# Patient Record
Sex: Female | Born: 1955
Health system: Southern US, Community
[De-identification: ages and names within clinical notes are randomized; demographics above are authoritative.]

## PROBLEM LIST (undated history)

## (undated) DIAGNOSIS — D649 Anemia, unspecified: Secondary | ICD-10-CM

## (undated) DIAGNOSIS — G43909 Migraine, unspecified, not intractable, without status migrainosus: Secondary | ICD-10-CM

## (undated) DIAGNOSIS — M549 Dorsalgia, unspecified: Secondary | ICD-10-CM

## (undated) DIAGNOSIS — R569 Unspecified convulsions: Secondary | ICD-10-CM

## (undated) DIAGNOSIS — F419 Anxiety disorder, unspecified: Secondary | ICD-10-CM

## (undated) DIAGNOSIS — G8929 Other chronic pain: Secondary | ICD-10-CM

## (undated) DIAGNOSIS — Z8711 Personal history of peptic ulcer disease: Secondary | ICD-10-CM

## (undated) DIAGNOSIS — K911 Postgastric surgery syndromes: Secondary | ICD-10-CM

## (undated) DIAGNOSIS — C801 Malignant (primary) neoplasm, unspecified: Secondary | ICD-10-CM

## (undated) DIAGNOSIS — G20A1 Parkinson's disease without dyskinesia, without mention of fluctuations: Secondary | ICD-10-CM

## (undated) DIAGNOSIS — T39395A Adverse effect of other nonsteroidal anti-inflammatory drugs [NSAID], initial encounter: Secondary | ICD-10-CM

## (undated) DIAGNOSIS — F32A Depression, unspecified: Secondary | ICD-10-CM

## (undated) DIAGNOSIS — R634 Abnormal weight loss: Secondary | ICD-10-CM

## (undated) DIAGNOSIS — F329 Major depressive disorder, single episode, unspecified: Secondary | ICD-10-CM

## (undated) DIAGNOSIS — K311 Adult hypertrophic pyloric stenosis: Secondary | ICD-10-CM

## (undated) DIAGNOSIS — K259 Gastric ulcer, unspecified as acute or chronic, without hemorrhage or perforation: Secondary | ICD-10-CM

## (undated) DIAGNOSIS — K219 Gastro-esophageal reflux disease without esophagitis: Secondary | ICD-10-CM

## (undated) DIAGNOSIS — M810 Age-related osteoporosis without current pathological fracture: Secondary | ICD-10-CM

## (undated) DIAGNOSIS — K589 Irritable bowel syndrome without diarrhea: Secondary | ICD-10-CM

## (undated) DIAGNOSIS — I639 Cerebral infarction, unspecified: Secondary | ICD-10-CM

## (undated) DIAGNOSIS — M069 Rheumatoid arthritis, unspecified: Secondary | ICD-10-CM

## (undated) DIAGNOSIS — T39095A Adverse effect of salicylates, initial encounter: Secondary | ICD-10-CM

## (undated) HISTORY — DX: Dorsalgia, unspecified: M54.9

## (undated) HISTORY — DX: Abnormal weight loss: R63.4

## (undated) HISTORY — DX: Adult hypertrophic pyloric stenosis: K31.1

## (undated) HISTORY — DX: Migraine, unspecified, not intractable, without status migrainosus: G43.909

## (undated) HISTORY — DX: Anemia, unspecified: D64.9

## (undated) HISTORY — DX: Unspecified convulsions: R56.9

## (undated) HISTORY — DX: Parkinson's disease without dyskinesia, without mention of fluctuations: G20.A1

## (undated) HISTORY — DX: Irritable bowel syndrome, unspecified: K58.9

## (undated) HISTORY — PX: SKIN BIOPSY: SHX1

## (undated) HISTORY — DX: Other chronic pain: G89.29

## (undated) HISTORY — DX: Postgastric surgery syndromes: K91.1

## (undated) HISTORY — DX: Age-related osteoporosis without current pathological fracture: M81.0

## (undated) HISTORY — DX: Rheumatoid arthritis, unspecified: M06.9

## (undated) HISTORY — DX: Gastro-esophageal reflux disease without esophagitis: K21.9

## (undated) HISTORY — DX: Major depressive disorder, single episode, unspecified: F32.9

## (undated) HISTORY — DX: Gastric ulcer, unspecified as acute or chronic, without hemorrhage or perforation: K25.9

## (undated) HISTORY — DX: Personal history of peptic ulcer disease: Z87.11

## (undated) HISTORY — DX: Adverse effect of other nonsteroidal anti-inflammatory drugs (NSAID), initial encounter: T39.395A

## (undated) HISTORY — PX: BREAST SURGERY: SHX581

## (undated) HISTORY — DX: Adverse effect of salicylates, initial encounter: T39.095A

## (undated) HISTORY — DX: Anxiety disorder, unspecified: F41.9

## (undated) HISTORY — DX: Depression, unspecified: F32.A

## (undated) HISTORY — PX: TUBAL LIGATION: SHX77

## (undated) HISTORY — DX: Cerebral infarction, unspecified: I63.9

## (undated) HISTORY — DX: Malignant (primary) neoplasm, unspecified: C80.1

---

## 1980-05-22 HISTORY — PX: SEPTOPLASTY: SUR1290

## 1981-05-22 HISTORY — PX: EXCISION MORTON'S NEUROMA: SHX5013

## 1998-05-20 ENCOUNTER — Ambulatory Visit (HOSPITAL_BASED_OUTPATIENT_CLINIC_OR_DEPARTMENT_OTHER): Admission: RE | Admit: 1998-05-20 | Discharge: 1998-05-20 | Payer: Self-pay | Admitting: General Surgery

## 1998-05-22 DIAGNOSIS — R569 Unspecified convulsions: Secondary | ICD-10-CM

## 1998-05-22 HISTORY — PX: TOTAL ABDOMINAL HYSTERECTOMY: SHX209

## 1998-05-22 HISTORY — DX: Unspecified convulsions: R56.9

## 2000-01-16 ENCOUNTER — Emergency Department (HOSPITAL_COMMUNITY): Admission: EM | Admit: 2000-01-16 | Discharge: 2000-01-16 | Payer: Self-pay | Admitting: Emergency Medicine

## 2001-05-10 ENCOUNTER — Encounter: Payer: Self-pay | Admitting: General Surgery

## 2001-05-10 ENCOUNTER — Encounter: Admission: RE | Admit: 2001-05-10 | Discharge: 2001-05-10 | Payer: Self-pay | Admitting: General Surgery

## 2001-11-05 ENCOUNTER — Ambulatory Visit (HOSPITAL_COMMUNITY): Admission: RE | Admit: 2001-11-05 | Discharge: 2001-11-05 | Payer: Self-pay | Admitting: Specialist

## 2001-11-05 ENCOUNTER — Encounter: Payer: Self-pay | Admitting: Specialist

## 2004-02-08 ENCOUNTER — Ambulatory Visit (HOSPITAL_COMMUNITY): Admission: RE | Admit: 2004-02-08 | Discharge: 2004-02-08 | Payer: Self-pay

## 2004-05-19 ENCOUNTER — Emergency Department: Payer: Self-pay | Admitting: Emergency Medicine

## 2005-08-17 ENCOUNTER — Encounter: Admission: RE | Admit: 2005-08-17 | Discharge: 2005-08-17 | Payer: Self-pay | Admitting: Internal Medicine

## 2005-09-14 ENCOUNTER — Encounter: Admission: RE | Admit: 2005-09-14 | Discharge: 2005-09-14 | Payer: Self-pay | Admitting: Internal Medicine

## 2006-06-29 ENCOUNTER — Encounter: Payer: Self-pay | Admitting: Internal Medicine

## 2006-07-04 ENCOUNTER — Ambulatory Visit (HOSPITAL_COMMUNITY): Admission: RE | Admit: 2006-07-04 | Discharge: 2006-07-04 | Payer: Self-pay | Admitting: Gastroenterology

## 2006-07-04 ENCOUNTER — Encounter (INDEPENDENT_AMBULATORY_CARE_PROVIDER_SITE_OTHER): Payer: Self-pay | Admitting: Specialist

## 2006-07-04 ENCOUNTER — Encounter: Payer: Self-pay | Admitting: Internal Medicine

## 2006-07-04 DIAGNOSIS — K311 Adult hypertrophic pyloric stenosis: Secondary | ICD-10-CM

## 2006-07-04 HISTORY — PX: ESOPHAGOGASTRODUODENOSCOPY: SHX1529

## 2006-07-04 HISTORY — DX: Adult hypertrophic pyloric stenosis: K31.1

## 2006-07-11 ENCOUNTER — Encounter: Admission: RE | Admit: 2006-07-11 | Discharge: 2006-07-11 | Payer: Self-pay | Admitting: Gastroenterology

## 2006-07-11 ENCOUNTER — Encounter: Payer: Self-pay | Admitting: Internal Medicine

## 2006-08-06 ENCOUNTER — Ambulatory Visit (HOSPITAL_COMMUNITY): Admission: RE | Admit: 2006-08-06 | Discharge: 2006-08-06 | Payer: Self-pay | Admitting: Gastroenterology

## 2006-08-06 ENCOUNTER — Encounter: Payer: Self-pay | Admitting: Internal Medicine

## 2008-03-31 ENCOUNTER — Encounter: Payer: Self-pay | Admitting: Internal Medicine

## 2008-09-16 ENCOUNTER — Encounter: Admission: RE | Admit: 2008-09-16 | Discharge: 2008-09-16 | Payer: Self-pay | Admitting: Gastroenterology

## 2008-09-16 ENCOUNTER — Encounter: Payer: Self-pay | Admitting: Internal Medicine

## 2008-10-16 ENCOUNTER — Encounter: Payer: Self-pay | Admitting: Internal Medicine

## 2008-11-25 ENCOUNTER — Ambulatory Visit (HOSPITAL_COMMUNITY): Admission: RE | Admit: 2008-11-25 | Discharge: 2008-11-25 | Payer: Self-pay | Admitting: Gastroenterology

## 2008-11-25 ENCOUNTER — Encounter: Payer: Self-pay | Admitting: Internal Medicine

## 2008-11-25 ENCOUNTER — Encounter (INDEPENDENT_AMBULATORY_CARE_PROVIDER_SITE_OTHER): Payer: Self-pay | Admitting: Gastroenterology

## 2008-11-25 DIAGNOSIS — K311 Adult hypertrophic pyloric stenosis: Secondary | ICD-10-CM

## 2008-11-25 HISTORY — DX: Adult hypertrophic pyloric stenosis: K31.1

## 2008-11-25 HISTORY — PX: ESOPHAGOGASTRODUODENOSCOPY: SHX1529

## 2009-04-13 ENCOUNTER — Encounter: Payer: Self-pay | Admitting: Internal Medicine

## 2009-04-13 ENCOUNTER — Ambulatory Visit (HOSPITAL_COMMUNITY): Admission: RE | Admit: 2009-04-13 | Discharge: 2009-04-13 | Payer: Self-pay | Admitting: Gastroenterology

## 2009-04-13 HISTORY — PX: ESOPHAGOGASTRODUODENOSCOPY: SHX1529

## 2009-06-04 ENCOUNTER — Encounter: Payer: Self-pay | Admitting: Internal Medicine

## 2009-06-08 ENCOUNTER — Encounter: Admission: RE | Admit: 2009-06-08 | Discharge: 2009-06-08 | Payer: Self-pay | Admitting: Surgery

## 2009-06-29 HISTORY — PX: OTHER SURGICAL HISTORY: SHX169

## 2009-06-30 ENCOUNTER — Encounter (HOSPITAL_COMMUNITY): Admission: RE | Admit: 2009-06-30 | Discharge: 2009-09-21 | Payer: Self-pay | Admitting: Gastroenterology

## 2009-07-03 ENCOUNTER — Encounter: Payer: Self-pay | Admitting: Internal Medicine

## 2009-07-09 ENCOUNTER — Encounter: Payer: Self-pay | Admitting: Internal Medicine

## 2009-07-09 ENCOUNTER — Inpatient Hospital Stay (HOSPITAL_COMMUNITY): Admission: RE | Admit: 2009-07-09 | Discharge: 2009-07-13 | Payer: Self-pay | Admitting: Surgery

## 2009-07-22 ENCOUNTER — Encounter: Payer: Self-pay | Admitting: Internal Medicine

## 2009-10-27 ENCOUNTER — Encounter: Payer: Self-pay | Admitting: Internal Medicine

## 2010-03-11 ENCOUNTER — Encounter: Admission: RE | Admit: 2010-03-11 | Discharge: 2010-03-11 | Payer: Self-pay

## 2010-03-11 ENCOUNTER — Encounter: Payer: Self-pay | Admitting: Internal Medicine

## 2010-04-27 ENCOUNTER — Encounter: Payer: Self-pay | Admitting: Internal Medicine

## 2010-05-03 ENCOUNTER — Ambulatory Visit: Payer: Self-pay | Admitting: Internal Medicine

## 2010-06-12 ENCOUNTER — Encounter: Payer: Self-pay | Admitting: Gastroenterology

## 2010-06-12 ENCOUNTER — Encounter: Payer: Self-pay | Admitting: Internal Medicine

## 2010-06-17 ENCOUNTER — Telehealth: Payer: Self-pay | Admitting: Internal Medicine

## 2010-06-21 NOTE — Letter (Signed)
Summary: New Patient letter  [facility]  [address] [place], [place] [address]   Phone: [phone]  Fax: [phone]       04/27/2010 MRN: [id number]  Alexandra Lewis [address] [place], [place]  [address]  Dear Ms. Kristin Bruins,  Welcome to the Gastroenterology Division at [facility].    You are scheduled to see Dr.  Leone Payor on 05/03/10 at 4:00 p.m. on the 3rd floor at [facility], [address]  We ask that you try to arrive at our office 15 minutes prior to your appointment time to allow for check-in.  We would like you to complete the enclosed self-administered evaluation form prior to your visit and bring it with you on the day of your appointment.  We will review it with you.  Also, please bring a complete list of all your medications or, if you prefer, bring the medication bottles and we will list them.  Please bring your insurance card so that we may make a copy of it.  If your insurance requires a referral to see a specialist, please bring your referral form from your primary care physician.  Co-payments are due at the time of your visit and may be paid by cash, check or credit card.     Your office visit will consist of a consult with your physician (includes a physical exam), any laboratory testing he/she may order, scheduling of any necessary diagnostic testing (e.g. x-ray, ultrasound, CT-scan), and scheduling of a procedure (e.g. Endoscopy, Colonoscopy) if required.  Please allow enough time on your schedule to allow for any/all of these possibilities.    If you cannot keep your appointment, please call [phone] to cancel or reschedule prior to your appointment date.  This allows [place] the opportunity to schedule an appointment for another patient in need of care.  If you do not cancel or reschedule by 5 p.m. the business day prior to your appointment date, you will be charged a $50.00 late cancellation/no-show fee.    Thank you for  choosing [facility] for your medical needs.  We appreciate the opportunity to care for you.  Please visit [place] at our website  to learn more about our practice.                     Sincerely,                                                             The Gastroenterology Division

## 2010-06-22 ENCOUNTER — Other Ambulatory Visit: Payer: Self-pay | Admitting: Internal Medicine

## 2010-06-22 ENCOUNTER — Ambulatory Visit (INDEPENDENT_AMBULATORY_CARE_PROVIDER_SITE_OTHER): Payer: Medicare Other | Admitting: Internal Medicine

## 2010-06-22 ENCOUNTER — Encounter (INDEPENDENT_AMBULATORY_CARE_PROVIDER_SITE_OTHER): Payer: Self-pay | Admitting: *Deleted

## 2010-06-22 ENCOUNTER — Other Ambulatory Visit: Payer: Commercial Managed Care - PPO

## 2010-06-22 ENCOUNTER — Encounter: Payer: Self-pay | Admitting: Internal Medicine

## 2010-06-22 DIAGNOSIS — K589 Irritable bowel syndrome without diarrhea: Secondary | ICD-10-CM

## 2010-06-22 DIAGNOSIS — R634 Abnormal weight loss: Secondary | ICD-10-CM

## 2010-06-22 DIAGNOSIS — G43909 Migraine, unspecified, not intractable, without status migrainosus: Secondary | ICD-10-CM | POA: Insufficient documentation

## 2010-06-22 DIAGNOSIS — R112 Nausea with vomiting, unspecified: Secondary | ICD-10-CM

## 2010-06-22 DIAGNOSIS — K911 Postgastric surgery syndromes: Secondary | ICD-10-CM | POA: Insufficient documentation

## 2010-06-22 DIAGNOSIS — M810 Age-related osteoporosis without current pathological fracture: Secondary | ICD-10-CM | POA: Insufficient documentation

## 2010-06-22 DIAGNOSIS — Z8711 Personal history of peptic ulcer disease: Secondary | ICD-10-CM | POA: Insufficient documentation

## 2010-06-22 DIAGNOSIS — F39 Unspecified mood [affective] disorder: Secondary | ICD-10-CM | POA: Insufficient documentation

## 2010-06-22 LAB — CBC WITH DIFFERENTIAL/PLATELET
Basophils Absolute: 0 10*3/uL (ref 0.0–0.1)
Eosinophils Absolute: 0 10*3/uL (ref 0.0–0.7)
Hemoglobin: 13.7 g/dL (ref 12.0–15.0)
Lymphocytes Relative: 28.7 % (ref 12.0–46.0)
MCV: 88.5 fl (ref 78.0–100.0)
Monocytes Relative: 9.5 % (ref 3.0–12.0)
Neutro Abs: 3.7 10*3/uL (ref 1.4–7.7)
Neutrophils Relative %: 60.5 % (ref 43.0–77.0)
RDW: 14.1 % (ref 11.5–14.6)

## 2010-06-22 LAB — COMPREHENSIVE METABOLIC PANEL
AST: 16 U/L (ref 0–37)
Alkaline Phosphatase: 94 U/L (ref 39–117)
BUN: 9 mg/dL (ref 6–23)
CO2: 30 mEq/L (ref 19–32)
Chloride: 106 mEq/L (ref 96–112)
Potassium: 4.5 mEq/L (ref 3.5–5.1)
Sodium: 141 mEq/L (ref 135–145)
Total Bilirubin: 0.4 mg/dL (ref 0.3–1.2)

## 2010-06-22 LAB — IGA: IgA: 72 mg/dL (ref 68–378)

## 2010-06-22 LAB — CONVERTED CEMR LAB: Vit D, 25-Hydroxy: 46 ng/mL (ref 30–89)

## 2010-06-23 DIAGNOSIS — R112 Nausea with vomiting, unspecified: Secondary | ICD-10-CM

## 2010-06-23 DIAGNOSIS — R634 Abnormal weight loss: Secondary | ICD-10-CM

## 2010-06-23 NOTE — Op Note (Signed)
Summary: Hiatal hernia/gastric outlet obstruction   NAME:  Alexandra Lewis, Alexandra Lewis          ACCOUNT NO.:  [id number]      MEDICAL RECORD NO.:  [id number]          PATIENT TYPE:  INP      LOCATION:  0001                         FACILITY:  [facility]      PHYSICIAN:  Thornton Park. Daphine Deutscher, MD  DATE OF BIRTH:  [birth year]      DATE OF PROCEDURE:  07/09/2009   DATE OF DISCHARGE:                                  OPERATIVE REPORT      PREOPERATIVE INDICATIONS:  This is a 55 year old white female with   gastric outlet obstruction.      POSTOPERATIVE DIAGNOSIS:  Hiatal hernia and gastric outlet obstruction.      PROCEDURES:  Laparoscopic takedown and repair of sliding hiatal hernia,   truncal vagotomy, Heinicke-Mikulicz pyloroplasty, upper endoscopy.      SURGEON:  Luretha Murphy, MD      ASSISTANT:  Dr. Abigail Miyamoto      ANESTHESIA:  General endotracheal.      DESCRIPTION OF PROCEDURE:  Alexandra Lewis was taken to room 1 on Friday,   July 09, 2009, given general anesthesia.  The abdomen was prepped   with a Technicare equivalent, draped sterilely.  Access to the abdomen   was achieved through the left upper quadrant, using a 0-degree 5-mm   scope, entering the abdomen and insufflating without difficulty.   Following this, a 5-mm was placed with a 5-mm scope on the left lower   portion of the abdomen and on the right side I used a 10/11 in the lower   port and 5 in the upper port.  A 5 upper midline port was used for the   Surveyor, mining.      The liver was retracted and pictures were obtained of a fairly big   hiatal hernia with stomach up in the chest and a fairly prominent   defect.  The this was first managed with taking down the hiatal hernia   sac and bringing the stomach to length.  I then repaired the hiatus with   a single suture posteriorly and anteriorly without pledgets and we   passed a #50 lighted bougie and that was snug going through this   closure, but passed without  resistance.  I also was able to get a   Penrose drain around the foregut, holding at length and then found the   anterior vagus on the left, which was taken off when I was taking down   the phrenoesophageal ligaments and portion of the hernia sac and then   the posterior was found and clipped and then divided, first cauterizing   with the Harmonic scalpel and then was cut.  These were divided, but   were not sent for frozen section because that was not going to impact   our procedure in that locale.      We then went down and connected the 2 lower ports on the right, so that   there was good access to the duodenum, pyloric region.  That did, in   fact, show [place] the area we were  to bring that up and then I made a   longitudinal incision, about 4-5 cm in length.  I broke up the cicatrix   posteriorly and then put holding stitches on either side, which I used   to make the initial opening, and then closed these transversely, first   with the canal with using 4-0 PDS and then with interrupted seromuscular   sutures in the Lembert fashion.  This approximated this nicely.      Next I went in from above with the endoscope and passed the scope in   through the esophagus and then the EG junction and then found my way   down to the area of the pyloroplasty.  We were able to get across the   pyloroplasty into the duodenum and all the time inflating and putting   the anastomosis under water, but no bubbles were seen.  I then withdrew   the scope, decompressed the stomach, placed an NG tube, which I was able   to feel.  Some Tisseel was applied over the suture line and omentum was   lain over that, as well.      The incision, which was a transverse incision, only partially cut the   rectus muscles, allowing the medial rectus to remain.  The posterior   sheath was closed with running #1 PDS from either end and tied and then   the anterior sheath was closed from either end with running PDS and   tied.   The fascia was injected with some Marcaine.  We irrigated again   and then closed with 4-0 Vicryl, Benzoin and Steri-Strips.  The patient   tolerated the procedure well and was taken to the recovery room in   satisfactory condition.               Thornton Park Daphine Deutscher, MD            MBM/MEDQ  D:  07/09/2009  T:  07/09/2009  Job:  [id number]      cc:   Dr. Freddi Starr, II      Petra Kuba, M.D.   Fax: [phone]      Electronically Signed by Luretha Murphy MD on 07/26/2009 02:13:44 PM

## 2010-06-23 NOTE — Procedures (Signed)
Summary: EGD/ Dr Randa Evens   EGD  Procedure date:  07/04/2006  Findings:      Location: [facility]   NAME:  Alexandra Lewis, Alexandra Lewis          ACCOUNT NO.:  [id number]   MEDICAL RECORD NO.:  [id number]          PATIENT TYPE:  AMB   LOCATION:  ENDO                         FACILITY:  [facility]   PHYSICIAN:  [name]., M.D.DATE OF BIRTH:  12-Feb-1956   DATE OF PROCEDURE:  07/04/2006  DATE OF DISCHARGE:                               OPERATIVE REPORT   SURGEON:  Fayrene Fearing L. Randa Evens, M.D.   PROCEDURE:  Esophagogastroduodenoscopy with biopsy.   MEDICATIONS:  Hurricaine spray, fentanyl 75 mcg, Versed 7.5 mg IV.   SCOPE:  Pentax endoscope.   INDICATIONS:  Patient with abdominal pain, nausea and vomiting, weight  loss, diarrhea.  This is done to evaluate for this.  She also has been  found to be anemic and iron deficient.   DESCRIPTION OF PROCEDURE:  The procedure was explained to the patient  and consent obtained.  In the left lateral decubitus position, the scope  was inserted and advanced into the esophagus.  The stomach was reached.  The gastric outlet obstruction was seen. The gastric outlet obstruction  was markedly inflamed.  We went towards what appeared to be the pyloric  channel, and on the anterior wall could be seen an ulcer.  I did not try  to push through this.  There was a lot of inflammation, and the clear  white base of an ulcer in the anterior wall of the pyloric channel.  The  scope was withdrawn back into the stomach.  Several biopsies were  obtained for a rapid urease test for Helicobacter.  The fundus and  cardia were seen well on the retroflex view.  The scope was withdrawn.  The distal esophagus was somewhat reddened, but there were no  ulcerations or other gross abnormalities.  The scope was withdrawn.  The  patient tolerated the procedure well.   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   PLAN:  Will start the patient on  Nexium b.i.d., check the test for  Helicobacter, and see her back in the office in 3 weeks.           ______________________________  Llana Aliment Malon Kindle., M.D.     Waldron Session  D:  07/04/2006  T:  07/04/2006  Job:  [id number]   cc:   Dr Cloyde Reams

## 2010-06-23 NOTE — Procedures (Signed)
Summary: EGD/ Dr Ewing Schlein   EGD  Procedure date:  04/13/2009  Findings:      Location: [facility]   NAME:  Alexandra Lewis, Alexandra Lewis          ACCOUNT NO.:  [id number]      MEDICAL RECORD NO.:  [id number]          PATIENT TYPE:  AMB      LOCATION:  ENDO                         FACILITY:  [facility]      PHYSICIAN:  Petra Kuba, M.D.    DATE OF BIRTH:  November 12, 1955      DATE OF PROCEDURE:  04/13/2009   DATE OF DISCHARGE:                                  OPERATIVE REPORT      PROCEDURE PERFORMED:  Esophagogastroduodenoscopy with pyloric balloon   dilatation.      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      INDICATIONS FOR PROCEDURE:  The patient with some help from previous   balloon dilatation, requesting another.  Consent was signed after risks,   benefits, methods, options thoroughly discussed multiple times in the   past with both the patient and her husband.      MEDICINES USED:  Fentanyl 120 mcg, Versed 12 mg.      PROCEDURE IN DETAIL:  First therapeutic video endoscope was inserted by   direct vision into her esophagus.  A small to medium sized hiatal hernia   was seen and advanced into the stomach.  A little bit of fluid was   suctioned but there was not much remaining.  Scope was advanced to the   antrum where a peripyloric shallow ulceration and stricturing was   confirmed.  We were unable to advance the scope.  The scope was   retroflexed pertinent for a normal angularis, cardia, fundus, lesser and   greater curve except for the hiatal hernia was confirmed in the cardia.   Straight visualization in the stomach did not reveal any additional   findings.  We went ahead and advanced to the pylorus and using a 5.5   [place] straight catheter, dye was injected into the duodenum and then we   went ahead and proceeded with our balloon  wire guided dilations under   fluoro using a 5.5 cm balloon.  Initially we had the adjustable 8 to 10   mm balloon which was inflated first to 8 mm  and held for 30 seconds and   then 10 mm and hold for a minute.  Then we switched to the 10-12   adjustable balloon and held it for 11 mm for 30 seconds than 12 mm for a   minute.  Unfortunately we still could not advance the scope at this   juncture.  There seemed to be still some internal stricturing further   distally and we went ahead and repeated the dilation as above, pushing   the balloon further in to the duodenum and also putting contrast in the   balloon.  Unfortunately with each of the balloon dilatations just a   touch of waste was seen in the middle of the balloon.  We did reinflate   to 0 mm for a minute and then  12 mm for a minute, but still could not   advance the therapeutic scope.  At this juncture we switched to the   regular scope but could not advance that through the pylorus either.  We   then went ahead and reinserted the therapeutic scope and passed the   adjustable 12-15 mm adjustable balloon catheter and inflated it to 13.5   mm for a minute using the 5.5 cm adjustable balloons as previously done.   All positioning was confirmed under fluoroscopy.  After a minute the   balloon was deflated and removed.  We did remove the scope since we were   unable to advance it but were able to advance the pediatric endoscope   which showed a normal distal bulb and second portion of the duodenum and   normal ampulla as well.  The scope was removed.  Again a good look at   the esophagus on multiple advances and withdrawals were normal and we   went ahead and tried the regular scope again, but we were unsuccessful   in advancing it.  We elected to stop the procedure at this juncture.   The scope was removed.  The patient tolerated the procedure well.  There   was no obvious immediate complication.      ENDOSCOPIC DIAGNOSES:   1. Small to medium size hiatal hernia.   2. Peripyloric ulcer, shallow with pyloric stricturing and stenosis        status post injecting Omnipaque using the 5.5 French straight       catheter.   3. Balloon dilatation under fluoro using the 5.5 cm adjustable       balloons,  8-10, 10-12 and then the 12-15 inflating it to 13.5 mm.   4. Able to advance the pediatric scope at the end of the procedure but       not the regular scope with normal distal bulb and second portion of       the duodenum.      PLAN:  See how she does with this dilatation.  I still expect her to   need surgical options at some point in the future but happy to continue   aggressiveness with the dilation if need be  and will continue pump   inhibitors b.i.d. and the customary warnings will be reviewed.                  ______________________________   Petra Kuba, M.D.            MEM/MEDQ  D:  04/13/2009  T:  04/13/2009  Job:  [id number]      cc:   Thornton Park Daphine Deutscher, MD   [address], [address]   [place]   [place] [address]

## 2010-06-23 NOTE — Procedures (Signed)
Summary: EGD / Dr Magod/path   EGD  Procedure date:  11/25/2008  Findings:      Location: [facility]   NAME:  Alexandra Lewis, Alexandra Lewis          ACCOUNT NO.:  [id number]      MEDICAL RECORD NO.:  [id number]          PATIENT TYPE:  AMB      LOCATION:  ENDO                         FACILITY:  [facility]      PHYSICIAN:  Petra Kuba, M.D.    DATE OF BIRTH:  [birth year]      DATE OF PROCEDURE:  11/25/2008   DATE OF DISCHARGE:                                  OPERATIVE REPORT      PROCEDURE:  Esophagogastroduodenoscopy balloon dilatation and biopsy.      INDICATION:  Nausea, vomiting, abdominal abnormal CT scan, and history   of pyloric ulcer.  Consent was signed after risks, benefits, methods,   and options thoroughly discussed multiple times in the past.      MEDICINE USED:   1. Fentanyl 100 mcg.   2. Versed 12.5 mg.      PROCEDURE IN DETAIL:  The regular video endoscope was inserted by direct   vision.  The esophagus was normal.  The scope passed into the stomach   and obvious old food was seen.  Scope advanced to the pylorus, which had   a peripyloric ulcer shallow, but we could not advance the scope through   a tiny opening.  The scope was retroflexed and the parts of the proximal   stomach seen not obscured by food were normal.  Visualization of the   stomach did not reveal any additional findings.  Fluid was suctioned.   We cannot suction all of the food in the stomach, but some could since   we wanted to then balloon dilate her pylorus.  We had a switch to the   bigger scope with the bigger channel.  We elected to slowly withdraw and   we did have to move her to the fluoro table, which was done after the   scope was removed.  Her esophagus was normal.  Once she was repositioned   on the fluoro table, the larger channel, larger scope was inserted again   confirming a normal esophagus and advanced to the pyloric area.  Using   the straight ERCP catheter, dye was injected to  confirm the bulb and the   C-loop of the duodenum.  We then removed the catheter and advanced a   wire-guided balloon into the duodenum and initially used the adjustable   6-8 mm balloon inflating it to 6 mm for 30 seconds, 7 mm for 30 seconds,   and 8 mm for a minute.  There was a little bit of heme, but no obvious   problem.  We went ahead and removed this balloon and inserted the   adjustable 10-12 and again inflated first to 10 and then to 11 holding   each for a minute.  There was a little more heme, but no obvious   problem.  Both were done under fluoro, and we removed the balloon after   the 11 mm mark and  tried to advance the scope but were unsuccessful.   The dye had all dissipated in that junction, so we reinjected the dye   with the ERCP catheter.  There was no leak seen.  We reinserted the 12   adjustable balloon into the proper position under fluoro and   endoscopically and inflated into 12 mm and held it for a minute and then   were able to advance the scope into the duodenal bulb, which was normal   and around the C-loop to normal second portion of the duodenum.  We were   able to withdrawn the balloon and advance the scope at the same time.   The ampulla was seen and was normal.  The scope was slowly withdrawn.   There was minimal heme, but no active bleeding and no obvious   perforation.  Scope was withdrawn back to the stomach, little more of   the fluid and food was suctioned.  Scope slowly withdrawn.  Right before   withdrawing the scope, 2 biopsies of the antrum and 2 of the proximal   stomach were obtained to rule out Helicobacter.  Scope was removed.  The   patient tolerated the procedure well.  There was no obvious immediate   complication.      ENDOSCOPIC DIAGNOSES:   1. Old food in the stomach, some of them were suctioned.   2. Pyloric shallow ulcer and stenosis, unable to advance the regular       scope, status post stomach biopsy to rule out Helicobacter.    3. Injected dye using the ERCP catheter and then proceeded with wire-       guided balloon under endoscopic and fluoroscopic guidance using the       6- to 8-mm balloons and the 10- to 12-mm balloon as above.   4. Able to pass the scope after 12-mm balloon dilation with normal       distal bulb second portion of the duodenum and normal ampulla       without obvious complication.      PLAN:  See how the balloon dilation works.  Questionably more aggressive   dilation in the future versus surgical options.  Avoid aspirin,   nonsteroidals.  Continue pump inhibitors.  We will ask her to call and   give me an update in 1-2 weeks if doing well.                  ______________________________   Petra Kuba, M.D.            MEM/MEDQ  D:  11/25/2008  T:  11/26/2008  Job:  [id number]      cc:   Dr. Judie Grieve   Appended Document: EGD / Dr Ewing Schlein SP-Surgical Pathology - STATUS: Final  .                                         Perform Date: 7 Jul10 00:01  Ordered By: Cristela Felt,            Ordered Date: 7 Jul10 14:52  Facility: [facility]                              Department: [facility]  Service Report Text  [address]   [place], [place] [address]   [phone]  [phone]    REPORT OF SURGICAL PATHOLOGY    Case #: [id number]   Patient Name: Alexandra Lewis, Alexandra Lewis   Office Chart Number: N/A    MRN: [id number]   Pathologist: H. Hollice Espy, MD   DOB/Age January 04, 1956 (Age: 55) Gender: F   Date Taken: 11/25/2008   Date Received: 11/25/2008    FINAL DIAGNOSIS    ***MICROSCOPIC EXAMINATION AND DIAGNOSIS***    STOMACH, BIOPSY: MILD CHRONIC GASTRITIS, NO EVIDENCE OF   HELICOBACTER PYLORI, INTESTINAL METAPLASIA, DYSPLASIA OR   MALIGNANCY IDENTIFIED.    COMMENT   A Warthin-Starry stain is performed to determine the possibility   of the presence of Helicobacter pylori. The Warthin-Starry stain   is negative for organisms of Helicobacter pylori. The control(s)   stained appropriately.    gdt   Date  Reported: 11/26/2008 H. Hollice Espy, MD   *** Electronically Signed Out By [facility] ***    Clinical information   R/O Helicobacter (ms)    specimen(s) obtained   Stomach, biopsy    Gross Description   Received in formalin are tan, soft tissue fragments that are   submitted in toto. Number: Four   Size: 0.2 to 0.7 cm (GP:mj 11/25/08)    mj/    STOMBX-Stomach, biopsy   Received in formalin are tan, soft tissue fragments that are   submitted in toto. Number: Four   Size: 0.2 to 0.7 cm (GP:mj 11/25/08)   STOMACH, BIOPSY: MILD CHRONIC GASTRITIS, NO EVIDENCE OF   HELICOBACTER PYLORI, INTESTINAL METAPLASIA, DYSPLASIA OR   MALIGNANCY IDENTIFIED.  Additional Information  HL7 RESULT STATUS : F  External IF Update Timestamp : 2008-11-25:14:51:00.000000

## 2010-06-23 NOTE — Progress Notes (Signed)
Summary: appt wed PM?  Phone Note Call from Patient   Caller: Spouse Summary of Call: She is continuing to have nausea and vomiting and weight loss. (Dumping syndrome) Dr. Kristin Bruins asked if I could see her next week and we are hoping for Wed PM. Thinking 1 or 130 (I am off tha PM but will do this) Please find out if I can do this, I would not need alot of help she could not come before due to vomiting and they were then busy moving. Have we gotten records?? I do not think I have seen them - he may have and I will double check when i call him back Iva Boop MD, [facility]  June 17, 2010 12:51 PM   Follow-up for Phone Call        1:30 next wed, the above phone number doesn't work.  Do you have an alternate number and I can notify them of the appointment , or do you want to tell them. Follow-up by: Darcey Nora RN, CGRN,  June 17, 2010 3:55 PM  Additional Follow-up for Phone Call Additional follow up Details #1::        I have Dr. Luretha Murphy # and will tell him as well as ask abou records. Some must be in [place] so copy/paste please Additional Follow-up by: Iva Boop MD, Clementeen Graham,  June 17, 2010 4:29 PM    Additional Follow-up for Phone Call Additional follow up Details #2::    records copied from e-chart. Darcey Nora RN, [facility]  June 17, 2010 4:44 PM

## 2010-06-23 NOTE — Procedures (Signed)
Summary: Colon/ Dr Edwards/path   Colonoscopy  Procedure date:  07/04/2006  Findings:      Location:  [facility].    NAMEKAYELEE, Alexandra Lewis          ACCOUNT NO.:  [id number]   MEDICAL RECORD NO.:  [id number]          PATIENT TYPE:  AMB   LOCATION:  ENDO                         FACILITY:  [facility]   PHYSICIAN:  [name]., M.D.DATE OF BIRTH:  02-17-1956   DATE OF PROCEDURE:  07/04/2006  DATE OF DISCHARGE:                               OPERATIVE REPORT   PROCEDURE:  Colonoscopy and biopsy.   MEDICATIONS:  The patient received fentanyl 160 mcg, Versed 60 mg and  Phenergan 12.5 mg IV for both procedures.   SCOPE:  Pentax pediatric scope.   INDICATION:  Diarrhea, anemia, iron deficiency, weight loss.   DESCRIPTION OF PROCEDURE:  Following the endoscopy, the patient was  turned around and received additional sedation.  Digital exam was  performed.  The Pentax pediatric scope was inserted and advanced.  We  were able to advance eventually to the cecum using abdominal pressure.  The ileocecal valve and appendiceal orifice were seen, terminal ileum  entered for approximately 10 cm and was normal.  Scope withdrawn.  Cecum, ascending, transverse, descending and sigmoid colon seen well.  No polyps, diverticula or other gross lesions seen.  The scope was  withdrawn in the rectum  in forward and retroflex view, no polyps were  seen.  Multiple random biopsies were taken throughout the colon.   ASSESSMENT:  1. Diarrhea.  No obvious colitis.  Random biopsies obtained.  2. Iron-deficiency anemia, probably due to the ulcer.  No gross      lesions found in the colon.   PLAN:  Will increase the patient's Nexium.  Will see back in the office  in 3 weeks.           ______________________________  Llana Aliment Malon Kindle., M.D.     Waldron Session  D:  07/04/2006  T:  07/04/2006  Job:  [id number]   cc:   Earleen Reaper, M.D.  Appended Document: Colon/ Dr Randa Evens [facility] Surgical Pathology  - STATUS: Final             By: Windy Kalata,      Perform Date: [id number] 00:01  Ordered By: Lucrezia Starch Date: 13Feb08 14:49  Facility: [facility]                              Department: CPATH  Service Report Text  [facility]   [address]   [place], [place] [address]   [phone]    REPORT OF SURGICAL PATHOLOGY    Case #: [id number]   Patient Name: Alexandra Lewis   Office Chart Number: N/A    MRN: [id number]   Pathologist: Alden Server A. Delila Spence, MD   DOB/Age Sep 16, 1955 (Age: 55) Gender: F   Date Taken: 07/04/2006   Date Received: 07/04/2006    FINAL DIAGNOSIS    ***MICROSCOPIC EXAMINATION AND  DIAGNOSIS***    COLON, BIOPSY: BENIGN COLONIC MUCOSA. NO SIGNIFICANT   INFLAMMATION OR OTHER ABNORMALITIES IDENTIFIED.    COMMENT   There is colorectal mucosa with normal crypt architecture and no   objective increase in inflammation. No active inflammation,   microscopic colitis, collagenous colitis or significant chronic   changes identified. No hyperplastic or adenomatous changes are   seen, and there is no evidence of malignancy. (EA:mj 07/05/06)    mj   Date Reported: 07/05/2006 Alden Server A. Delila Spence, MD   *** Electronically Signed Out By EAA ***    Clinical information   Anemia; diarrhea and abdominal pain (ms)    specimen(s) obtained   Colon, biopsy, random    Gross Description   Received in formalin are tan, soft tissue fragments that are   submitted in toto. Number: Multiple.   Size: 0.2 cm up to 0.4 cm. (SP:cdc:07/04/05)    cc/

## 2010-06-28 ENCOUNTER — Other Ambulatory Visit: Payer: Commercial Managed Care - PPO | Admitting: Internal Medicine

## 2010-06-29 NOTE — Letter (Signed)
Summary: EGD Instructions  [facility]  [address] [place], [place] [address]   Phone: [phone]  Fax: [phone]       Alexandra Lewis    1956/03/25    MRN: [id number]       Procedure Day /Date:TUESDAY 06/28/2010     Arrival Time: 2PM     Procedure Time:3PM     Location of Procedure:                    X  [facility] (4th Floor)   PREPARATION FOR ENDOSCOPY   On 06/28/2010  THE DAY OF THE PROCEDURE:  1.   No solid foods, milk or milk products are allowed after midnight the night before your procedure.  2.   Do not drink anything colored red or purple.  Avoid juices with pulp.  No orange juice.  3.  You may drink clear liquids until1PM , which is 2 hours before your procedure.                                                                                                CLEAR LIQUIDS INCLUDE: Water Jello Ice Popsicles Tea (sugar ok, no milk/cream) Powdered fruit flavored drinks Coffee (sugar ok, no milk/cream) Gatorade Juice: apple, white grape, white cranberry  Lemonade Clear bullion, consomm, broth Carbonated beverages (any kind) Strained chicken noodle soup Hard Candy   MEDICATION INSTRUCTIONS  Unless otherwise instructed, you should take regular prescription medications with a small sip of water as early as possible the morning of your procedure.            OTHER INSTRUCTIONS  You will need a responsible adult at least 54 years of age to accompany you and drive you home.   This person must remain in the waiting room during your procedure.  Wear loose fitting clothing that is easily removed.  Leave jewelry and other valuables at home.  However, you may wish to bring a book to read or an iPod/MP3 player to listen to music as you wait for your procedure to start.  Remove all body piercing jewelry and leave at home.  Total time from sign-in until discharge is approximately 2-3 hours.  You should go home directly after  your procedure and rest.  You can resume normal activities the day after your procedure.  The day of your procedure you should not:   Drive   Make legal decisions   Operate machinery   Drink alcohol   Return to work  You will receive specific instructions about eating, activities and medications before you leave.    The above instructions have been reviewed and explained to me by   _______________________    I fully understand and can verbalize these instructions _____________________________ Date _________

## 2010-06-29 NOTE — Assessment & Plan Note (Signed)
Summary: nausea and vomiting/ rule out dumping syndrome/sheri   Vital Signs:  Patient profile:   55 year old female Height:      64 inches Weight:      87 pounds BMI:     14.99 BSA:     1.37 Pulse rate:   78 / minute Pulse rhythm:   regular BP sitting:   86 / 54  (right arm)  Vitals Entered By: Merri Ray CMA Duncan Dull) (June 22, 2010 1:33 PM)  History of Present Illness Visit Type: Initial Visit Primary GI MD: Stan Head MD [facility] Primary Provider: Kennith Gain Requesting Provider: n/a Chief Complaint: Weight loss with nausea and vomiting History of Present Illness:   55 yo maried white woman s/p laparoscopic truncal vagotomy and open pyloroplasty Jul 09 2009 by Dr. Wenda Low. 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 The ulcer was likely caused by persistent Fiorinal use.  Since that time she has struggled with maintaining her weight and has had intermitent problems with post-prandial nausea, vomiting and diarrhea. Sugar-containing foods will make this worse. She has lost 20 # in 1 year and was about 100# at the time of the surgery. Last few days are actually better.Grlled cheese, soup, crackers, hamburger recently ok. She gave up on Ensure as she only likes chocolate and she thinks chocolate is poorly tolerated. + early satiety + appetite but only recently coming back slept alot, now more motivated and energetic - admits she was depressed. she has vivid memories of her last balloon dilation and comments and ctions of the physician and medical staff involved. She repeatedly indicates today that she never wanted the operation. metaclopramide made her dizzy and fatigued  Uses Fiorinal at least once daily for years for migraines and bone pain.  Since surgery has never had normal bowel movements and has mixed hard stool with some constipation and some loose stools. Uses a stool softener intermittently.      GI Review of  Systems    Reports abdominal pain, acid reflux, heartburn, loss of appetite, nausea, vomiting, and  weight loss.   Weight loss of 20 pounds over 12months.   Denies belching, bloating, chest pain, dysphagia with liquids, dysphagia with solids, vomiting blood, and  weight gain.        Denies anal fissure, black tarry stools, change in bowel habit, constipation, diarrhea, diverticulosis, fecal incontinence, heme positive stool, hemorrhoids, irritable bowel syndrome, jaundice, light color stool, liver problems, rectal bleeding, and  rectal pain.   Preventive Screening-Counseling & Management  Caffeine-Diet-Exercise     Does Patient Exercise: yes      Drug Use:  no.    Colonoscopy  Procedure date:  07/04/2006  Findings:      1. Diarrhea.  No obvious colitis.  Random biopsies obtained.  2. Iron-deficiency anemia, probably due to the ulcer.  No gross      lesions found in the colon.  NO ABNORMAILITIES SEEN INCLUDING TERMINAL ILEUM  Dr. Carman Ching     ***MICROSCOPIC EXAMINATION AND DIAGNOSIS***    COLON, BIOPSY: BENIGN COLONIC MUCOSA. NO SIGNIFICANT   INFLAMMATION OR OTHER ABNORMALITIES IDENTIFIED.    COMMENT   There is colorectal mucosa with normal crypt architecture and no   objective increase in inflammation. No active inflammation,   microscopic colitis, collagenous colitis or significant chronic   changes identified. No hyperplastic or adenomatous changes are   seen, and there is no evidence of malignancy. (EA:mj 07/05/06)  Comments:  Repeat colonoscopy in 10 years.    EGD  Procedure date:  07/04/2006  Findings:        ASSESSMENT:  Duodenal ulcer with partial gastric outlet obstruction.   PLAN:  Will start the patient on Nexium b.i.d., check the test for  Helicobacter, and see her back in the office in 3 weeks.  Dr. Carman Ching  EGD  Procedure date:  11/25/2008  Findings:       ENDOSCOPIC DIAGNOSES:   1. Old food in the stomach, some of them  were suctioned.   2. Pyloric shallow ulcer and stenosis, unable to advance the regular       scope, status post stomach biopsy to rule out Helicobacter.   3. Injected dye using the ERCP catheter and then proceeded with wire-       guided balloon under endoscopic and fluoroscopic guidance using the       6- to 8-mm balloons and the 10- to 12-mm balloon as above.   4. Able to pass the scope after 12-mm balloon dilation with normal       distal bulb second portion of the duodenum and normal ampulla       without obvious complication.      PLAN:  See how the balloon dilation works.  Questionably more aggressive   dilation in the future versus surgical options.  Avoid aspirin,   nonsteroidals.  Continue pump inhibitors.  We will ask her to call and   give me an update in 1-2 weeks if doing well.                  ______________________________   Petra Kuba, M.D.   EGD  Procedure date:  04/13/2009  Findings:        ENDOSCOPIC DIAGNOSES:   1. Small to medium size hiatal hernia.   2. Peripyloric ulcer, shallow with pyloric stricturing and stenosis       status post injecting Omnipaque using the 5.5 French straight       catheter.   3. Balloon dilatation under fluoro using the 5.5 cm adjustable       balloons,  8-10, 10-12 and then the 12-15 inflating it to 13.5 mm.   4. Able to advance the pediatric scope at the end of the procedure but       not the regular scope with normal distal bulb and second portion of       the duodenum.      PLAN:  See how she does with this dilatation.  I still expect her to   need surgical options at some point in the future but happy to continue   aggressiveness with the dilation if need be  and will continue pump   inhibitors b.i.d. and the customary warnings will be reviewed.                  ______________________________   Petra Kuba, M.D.   Procedures Next Due Date:    Colonoscopy: 06/2016  Current Medications (verified): 1)  Estrace 1  Mg Tabs (Estradiol) .Marland Kitchen.. 1 By Mouth Once Daily 2)  Depakote 125 Mg Tbec (Divalproex Sodium) .Marland Kitchen.. 1 By Mouth Once Daily 3)  Nexium 40 Mg Cpdr (Esomeprazole Magnesium) .Marland Kitchen.. 1 By Mouth Once Daily 4)  Compazine 5mg  .... 2 Tablets As Needed 5)  Zomig 5 Mg Tabs (Zolmitriptan) .... 1/2 - 1 As Needed For Migraines 6)  Fiorinal/codeine #3 50-325-40-30 Mg Caps (Butalbital-Asa-Caff-Codeine) .... As Needed For Pain  7)  Vitamin D3 50000 Unit Caps (Cholecalciferol) .... 2 Times A Week  Allergies (verified): 1)  ! 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 Fatigue and hypersomnolence - improving anhedonia - improving All other ROS negative except as per HPI.   Physical Exam  General:  thin, white woman, NAD Eyes:  PERRLA, no icterus. Neck:  Supple; no masses or thyromegaly. Lungs:  Clear throughout to auscultation. Heart:  Regular rate and rhythm; no murmurs, rubs,  or bruits. Abdomen:  thin soft and nontender without HSM or mass BS+ no splash Extremities:  No clubbing, cyanosis, edema or deformities noted. Neurologic:  Alert and  oriented x4;  grossly normal neurologically. Cervical Nodes:  No significant cervical or supraclavicular adenopathy. she does have a mildly prominent riht submandibular node  Psych:  Alert and cooperative. Normal mood and affect.   Impression & Recommendations:  Problem # 1:  WEIGHT LOSS-ABNORMAL  (ICD-783.21) Assessment New I think this is multifactorial. A combination of problems from pyloroplasty and vagotomy (? dumping), and possibly more so depression. She needs a reassessment of the upper GI tract with EGD as she continues to use Fiorinal on a daily basis. Orders: T-Vitamin D (25-Hydroxy) [id number]) T-Sprue Panel (Celiac Disease Aby Eval) (83516x3/[address]) TLB-CBC Platelet - w/Differential (85025-CBCD) TLB-CMP (Comprehensive Metabolic Pnl) (80053-COMP) TLB-TSH (Thyroid Stimulating Hormone) (84443-TSH) TLB-IgA (Immunoglobulin A) (82784-IGA)  Problem # 2:  NAUSEA AND VOMITING (ICD-787.01) Pre-op this was thought due to pyloric stenosis and probably was. Presume post-operative gastric dysfunction now. ? recurrent ulcer disease. EGD to reassess. Risks, benefits,and indications of endoscopic procedure(s) were reviewed with the patient and all questions answered.  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 gastroparesis may need a gastric emptying study  Problem # 4:  MOOD DISORDER (ICD-296.90) Assessment: New She was and may still be depressed. The way she talks about her endoscopic therapy and surgery it sounds like she has post-traumatic stress issues also. At least lately after moving into a new home, things seem a bit better. She might benefit from counselling and was open to that but did not seem open to medication for this.  Problem # 5:  MIGRAINE, CHRONIC (ICD-346.90) Assessment: New On chronic, daily Fiorinal and intermittent Zomig. I do not know if she has seen a headache specialist but might benefit. 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  Problem # 6:  OSTEOPOROSIS (ICD-733.00) Assessment: New Her slight build, chronic GI issues and this raise ?'s of celiac disease. She has had a negative TTG Ab (0) but no IgA level drawn. Has not had duodenal bxs. Random colon biopsies were negative in the past.  Orders: T-Vitamin D (25-Hydroxy) [id number]) T-Sprue Panel (Celiac Disease Aby Eval) (83516x3/[address]) TLB-CBC Platelet - w/Differential (85025-CBCD) TLB-CMP (  Comprehensive Metabolic Pnl) (80053-COMP) TLB-TSH (Thyroid Stimulating Hormone) (84443-TSH) TLB-IgA (Immunoglobulin A) (82784-IGA)  Problem # 7:  PERSONAL HISTORY OF PEPTIC ULCER DISEASE (ICD-V12.71) Assessment: New Pyloric ulcer initially seen 2008, dilated twice in 2010. 11/2008 gastric biopsies negative for H. pylori I think an earlier CLOtest was also begative. She may have been on PPI therapy then (probably was) so rechecking H. ylori status will be worthwhile - not done today but will see if we can add serology  Patient Instructions: 1)  Please go to the basement to have your lab tests drawn today. 2)  CBC, CMET, TSH, celiac panel, vitamin D level  and IgA level. 3)  Keep eating high protein and higher fat fods and avoiding or minimizing simple sugars and carbohydrates. East several small meals a day, add protein powder to drinks and and sip instant breakfast or Ensure throughout the day. 4)  See you at your endoscopy February 7. 5)  Copy sent to : Earleen Reaper, MD [facility], [place]) 6)  The medication list was reviewed and reconciled.  All changed / newly prescribed medications were explained.  A complete medication list was provided to the patient / caregiver.   Orders Added: 1)  T-Vitamin D (25-Hydroxy) [phone] 2)  T-Sprue Panel (Celiac Disease Aby Eval) [83516x3/[address]] 3)  EGD [EGD] 4)  TLB-CBC Platelet - w/Differential [85025-CBCD] 5)  TLB-CMP (Comprehensive Metabolic Pnl) [80053-COMP] 6)  TLB-TSH (Thyroid  Stimulating Hormone) [84443-TSH] 7)  TLB-IgA (Immunoglobulin A) [82784-IGA]

## 2010-07-04 ENCOUNTER — Telehealth: Payer: Self-pay | Admitting: Internal Medicine

## 2010-07-06 ENCOUNTER — Other Ambulatory Visit: Payer: Medicare Other | Admitting: Internal Medicine

## 2010-07-07 NOTE — Letter (Signed)
Summary: Valarie Merino MD  Valarie Merino MD   Imported By: Lester [place] 06/29/2010 09:47:36  _____________________________________________________________________  External Attachment:    Type:   Image     Comment:   External Document

## 2010-07-07 NOTE — Letter (Signed)
Summary: Valarie Merino MD  Valarie Merino MD   Imported By: Lester [place] 06/29/2010 09:46:02  _____________________________________________________________________  External Attachment:    Type:   Image     Comment:   External Document

## 2010-07-07 NOTE — Letter (Signed)
Summary: Vida Rigger MD  Vida Rigger MD   Imported By: Lester  06/29/2010 09:59:42  _____________________________________________________________________  External Attachment:    Type:   Image     Comment:   External Document

## 2010-07-07 NOTE — Letter (Signed)
Summary: Valarie Merino MD  Valarie Merino MD   Imported By: Lester [place] 06/29/2010 09:50:13  _____________________________________________________________________  External Attachment:    Type:   Image     Comment:   External Document

## 2010-07-07 NOTE — Letter (Signed)
Summary: Tresea Mall MD  Tresea Mall MD   Imported By: Lester [place] 06/29/2010 10:01:19  _____________________________________________________________________  External Attachment:    Type:   Image     Comment:   External Document

## 2010-07-07 NOTE — Letter (Signed)
Summary: [facility]  [facility]   Imported By: Lester [place] 06/29/2010 09:54:13  _____________________________________________________________________  External Attachment:    Type:   Image     Comment:   External Document

## 2010-08-02 NOTE — Progress Notes (Signed)
Summary: Resch'd Procedure  Phone Note Call from Patient   Caller: Patient Call For: Dr. Leone Payor Summary of Call: Dr. Kristin Bruins canceled and r/s his wife's Endo from 07-06-10 to 08-03-10 b/c of a conflict with their sch'd. Would you like pt. charged the cancelation fee? Initial call taken by: Karna Christmas,  July 04, 2010 3:11 PM  Follow-up for Phone Call        no Follow-up by: Iva Boop MD, Clementeen Graham,  July 04, 2010 3:18 PM  Additional Follow-up for Phone Call Additional follow up Details #1::        Patient NOT BILLED. Additional Follow-up by: Leanor Kail [facility],  July 25, 2010 8:41 AM

## 2010-08-03 ENCOUNTER — Other Ambulatory Visit: Payer: Commercial Managed Care - PPO | Admitting: Internal Medicine

## 2010-08-11 LAB — COMPREHENSIVE METABOLIC PANEL
ALT: 17 U/L (ref 0–35)
AST: 22 U/L (ref 0–37)
Calcium: 9.7 mg/dL (ref 8.4–10.5)
Creatinine, Ser: 0.79 mg/dL (ref 0.4–1.2)
GFR calc Af Amer: >60 mL/min (ref >60)
Glucose, Bld: 114 mg/dL — ABNORMAL HIGH (ref 70–99)
Sodium: 143 mEq/L (ref 135–145)
Total Protein: 6.1 g/dL (ref 6.0–8.3)

## 2010-08-11 LAB — CBC
HCT: 32.5 % — ABNORMAL LOW (ref 36.0–46.0)
Hemoglobin: 11 g/dL — ABNORMAL LOW (ref 12.0–15.0)
MCHC: 34.6 g/dL (ref 30.0–36.0)
MCV: 85.4 fL (ref 78.0–100.0)
MCV: 85.8 fL (ref 78.0–100.0)
Platelets: 136 10*3/uL — ABNORMAL LOW (ref 150–400)
RDW: 14 % (ref 11.5–15.5)
WBC: 10.2 10*3/uL (ref 4.0–10.5)

## 2010-08-11 LAB — BASIC METABOLIC PANEL
BUN: 7 mg/dL (ref 6–23)
Chloride: 105 mEq/L (ref 96–112)
GFR calc non Af Amer: >60 mL/min (ref >60)
Potassium: 3.9 mEq/L (ref 3.5–5.1)
Sodium: 136 mEq/L (ref 135–145)

## 2010-08-11 LAB — MRSA PCR SCREENING: MRSA by PCR: NEGATIVE

## 2010-10-04 NOTE — Op Note (Signed)
NAMEBRAYLEY, Alexandra Lewis          ACCOUNT NO.:  [id number]   MEDICAL RECORD NO.:  [id number]          PATIENT TYPE:  AMB   LOCATION:  ENDO                         FACILITY:  [facility]   PHYSICIAN:  Petra Kuba, M.D.    DATE OF BIRTH:  June 20, 1955   DATE OF PROCEDURE:  11/25/2008  DATE OF DISCHARGE:                               OPERATIVE REPORT   PROCEDURE:  Esophagogastroduodenoscopy balloon dilatation and biopsy.   INDICATION:  Nausea, vomiting, abdominal abnormal CT scan, and history  of pyloric ulcer.  Consent was signed after risks, benefits, methods,  and options thoroughly discussed multiple times in the past.   MEDICINE USED:  1. Fentanyl 100 mcg.  2. Versed 12.5 mg.   PROCEDURE IN DETAIL:  The regular video endoscope was inserted by direct  vision.  The esophagus was normal.  The scope passed into the stomach  and obvious old food was seen.  Scope advanced to the pylorus, which had  a peripyloric ulcer shallow, but we could not advance the scope through  a tiny opening.  The scope was retroflexed and the parts of the proximal  stomach seen not obscured by food were normal.  Visualization of the  stomach did not reveal any additional findings.  Fluid was suctioned.  We cannot suction all of the food in the stomach, but some could since  we wanted to then balloon dilate her pylorus.  We had a switch to the  bigger scope with the bigger channel.  We elected to slowly withdraw and  we did have to move her to the fluoro table, which was done after the  scope was removed.  Her esophagus was normal.  Once she was repositioned  on the fluoro table, the larger channel, larger scope was inserted again  confirming a normal esophagus and advanced to the pyloric area.  Using  the straight ERCP catheter, dye was injected to confirm the bulb and the  C-loop of the duodenum.  We then removed the catheter and advanced a  wire-guided balloon into the duodenum and initially used the  adjustable  6-8 mm balloon inflating it to 6 mm for 30 seconds, 7 mm for 30 seconds,  and 8 mm for a minute.  There was a little bit of heme, but no obvious  problem.  We went ahead and removed this balloon and inserted the  adjustable 10-12 and again inflated first to 10 and then to 11 holding  each for a minute.  There was a little more heme, but no obvious  problem.  Both were done under fluoro, and we removed the balloon after  the 11 mm mark and tried to advance the scope but were unsuccessful.  The dye had all dissipated in that junction, so we reinjected the dye  with the ERCP catheter.  There was no leak seen.  We reinserted the 12  adjustable balloon into the proper position under fluoro and  endoscopically and inflated into 12 mm and held it for a minute and then  were able to advance the scope into the duodenal bulb, which was normal  and around the  C-loop to normal second portion of the duodenum.  We were  able to withdrawn the balloon and advance the scope at the same time.  The ampulla was seen and was normal.  The scope was slowly withdrawn.  There was minimal heme, but no active bleeding and no obvious  perforation.  Scope was withdrawn back to the stomach, little more of  the fluid and food was suctioned.  Scope slowly withdrawn.  Right before  withdrawing the scope, 2 biopsies of the antrum and 2 of the proximal  stomach were obtained to rule out Helicobacter.  Scope was removed.  The  patient tolerated the procedure well.  There was no obvious immediate  complication.   ENDOSCOPIC DIAGNOSES:  1. Old food in the stomach, some of them were suctioned.  2. Pyloric shallow ulcer and stenosis, unable to advance the regular      scope, status post stomach biopsy to rule out Helicobacter.  3. Injected dye using the ERCP catheter and then proceeded with wire-      guided balloon under endoscopic and fluoroscopic guidance using the      6- to 8-mm balloons and the 10- to 12-mm  balloon as above.  4. Able to pass the scope after 12-mm balloon dilation with normal      distal bulb second portion of the duodenum and normal ampulla      without obvious complication.   PLAN:  See how the balloon dilation works.  Questionably more aggressive  dilation in the future versus surgical options.  Avoid aspirin,  nonsteroidals.  Continue pump inhibitors.  We will ask her to call and  give me an update in 1-2 weeks if doing well.           ______________________________  Petra Kuba, M.D.     MEM/MEDQ  D:  11/25/2008  T:  11/26/2008  Job:  [id number]   cc:   Dr. Judie Grieve

## 2010-10-07 NOTE — Op Note (Signed)
Alexandra, Lewis          ACCOUNT NO.:  [id number]   MEDICAL RECORD NO.:  [id number]          PATIENT TYPE:  AMB   LOCATION:  ENDO                         FACILITY:  [facility]   PHYSICIAN:  [name]., M.D.DATE OF BIRTH:  1956/02/23   DATE OF PROCEDURE:  07/04/2006  DATE OF DISCHARGE:                               OPERATIVE REPORT   SURGEON:  Fayrene Fearing L. Randa Evens, M.D.   PROCEDURE:  Esophagogastroduodenoscopy with biopsy.   MEDICATIONS:  Hurricaine spray, fentanyl 75 mcg, Versed 7.5 mg IV.   SCOPE:  Pentax endoscope.   INDICATIONS:  Patient with abdominal pain, nausea and vomiting, weight  loss, diarrhea.  This is done to evaluate for this.  She also has been  found to be anemic and iron deficient.   DESCRIPTION OF PROCEDURE:  The procedure was explained to the patient  and consent obtained.  In the left lateral decubitus position, the scope  was inserted and advanced into the esophagus.  The stomach was reached.  The gastric outlet obstruction was seen. The gastric outlet obstruction  was markedly inflamed.  We went towards what appeared to be the pyloric  channel, and on the anterior wall could be seen an ulcer.  I did not try  to push through this.  There was a lot of inflammation, and the clear  white base of an ulcer in the anterior wall of the pyloric channel.  The  scope was withdrawn back into the stomach.  Several biopsies were  obtained for a rapid urease test for Helicobacter.  The fundus and  cardia were seen well on the retroflex view.  The scope was withdrawn.  The distal esophagus was somewhat reddened, but there were no  ulcerations or other gross abnormalities.  The scope was withdrawn.  The  patient tolerated the procedure well.   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   PLAN:  Will start the patient on Nexium b.i.d., check the test for  Helicobacter, and see her back in the office in 3 weeks.     ______________________________  Llana Aliment Malon Kindle., M.D.     Waldron Session  D:  07/04/2006  T:  07/04/2006  Job:  [id number]   cc:   Dr Cloyde Reams

## 2010-10-07 NOTE — Op Note (Signed)
Alexandra Lewis, Alexandra Lewis          ACCOUNT NO.:  [id number]   MEDICAL RECORD NO.:  [id number]          PATIENT TYPE:  AMB   LOCATION:  ENDO                         FACILITY:  [facility]   PHYSICIAN:  [name]., M.D.DATE OF BIRTH:  10-Mar-1956   DATE OF PROCEDURE:  07/04/2006  DATE OF DISCHARGE:                               OPERATIVE REPORT   PROCEDURE:  Colonoscopy and biopsy.   MEDICATIONS:  The patient received fentanyl 160 mcg, Versed 60 mg and  Phenergan 12.5 mg IV for both procedures.   SCOPE:  Pentax pediatric scope.   INDICATION:  Diarrhea, anemia, iron deficiency, weight loss.   DESCRIPTION OF PROCEDURE:  Following the endoscopy, the patient was  turned around and received additional sedation.  Digital exam was  performed.  The Pentax pediatric scope was inserted and advanced.  We  were able to advance eventually to the cecum using abdominal pressure.  The ileocecal valve and appendiceal orifice were seen, terminal ileum  entered for approximately 10 cm and was normal.  Scope withdrawn.  Cecum, ascending, transverse, descending and sigmoid colon seen well.  No polyps, diverticula or other gross lesions seen.  The scope was  withdrawn in the rectum  in forward and retroflex view, no polyps were  seen.  Multiple random biopsies were taken throughout the colon.   ASSESSMENT:  1. Diarrhea.  No obvious colitis.  Random biopsies obtained.  2. Iron-deficiency anemia, probably due to the ulcer.  No gross      lesions found in the colon.   PLAN:  Will increase the patient's Nexium.  Will see back in the office  in 3 weeks.           ______________________________  Llana Aliment Malon Kindle., M.D.     Waldron Session  D:  07/04/2006  T:  07/04/2006  Job:  [id number]   cc:   Earleen Reaper, M.D.

## 2011-06-23 DIAGNOSIS — D239 Other benign neoplasm of skin, unspecified: Secondary | ICD-10-CM

## 2011-06-23 HISTORY — DX: Other benign neoplasm of skin, unspecified: D23.9

## 2011-08-10 DIAGNOSIS — Z86018 Personal history of other benign neoplasm: Secondary | ICD-10-CM

## 2011-08-10 HISTORY — DX: Personal history of other benign neoplasm: Z86.018

## 2011-08-20 IMAGING — RF DG UGI W/ HIGH DENSITY W/KUB
19 of 24 series · 19 of 24 positions shown · non-contrast
Comparison: [HOSPITAL] at [HOSPITAL] abdominal pelvic
CT 09/16/2008 and [HOSPITAL] upper GI series 08/06/2006.

CLINICAL DATA: Gastric outlet obstruction.

UPPER GI SERIES W/HIGH DENSITY W/KUB
TECHNIQUE: After obtaining a scout radiograph, upper GI series
performed with high density barium and effervescent agent. Thin
barium also used. 13 mm barium tablet ingested with water.
Fluoroscopy Time: 4.5 minutes.

[Series 1: run · 1 of 2 slices shown (1 of 19)]
[im 1/2]
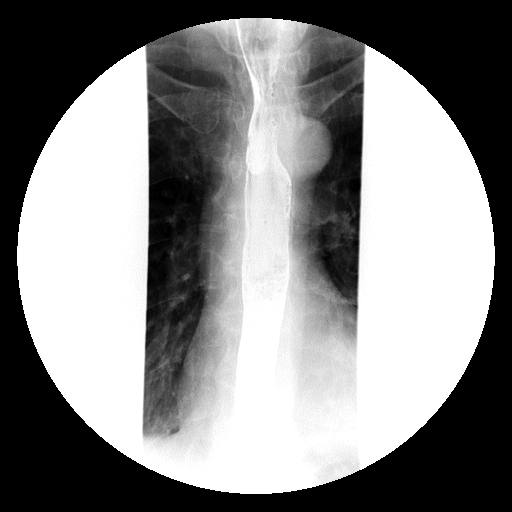

[Series 2: run · 1 of 2 slices shown (2 of 19)]
[im 1/2]
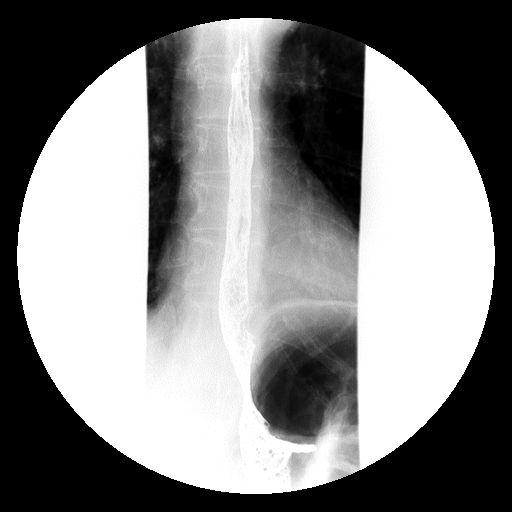

[Series 4: run · 1 of 1 slices shown (3 of 19)]
[im 1/1]
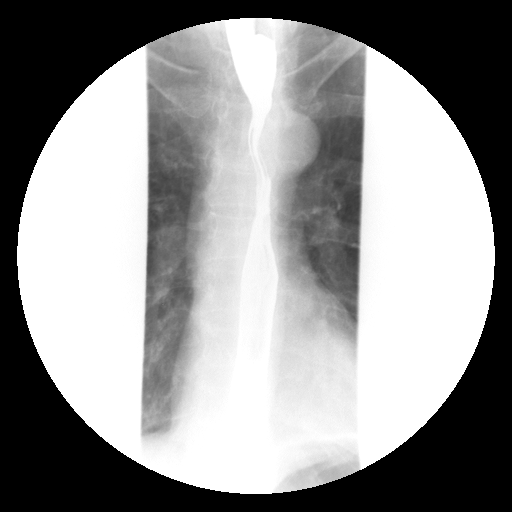

[Series 5: run · 1 of 1 slices shown (4 of 19)]
[im 1/1]
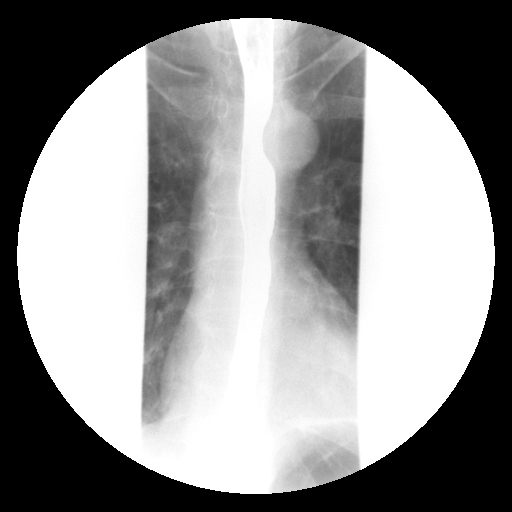

[Series 6: run · 1 of 1 slices shown (5 of 19)]
[im 1/1]
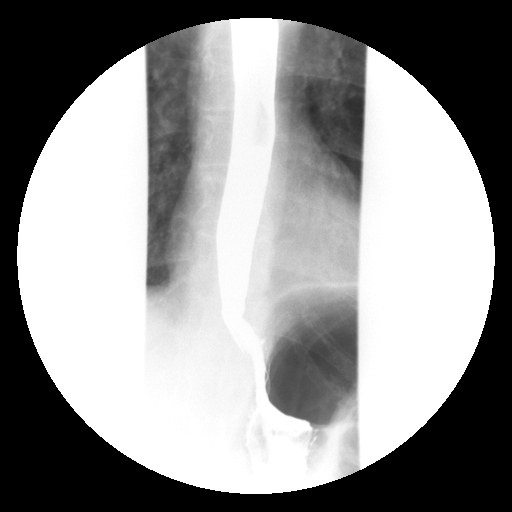

[Series 7: run · 1 of 1 slices shown (6 of 19)]
[im 1/1]
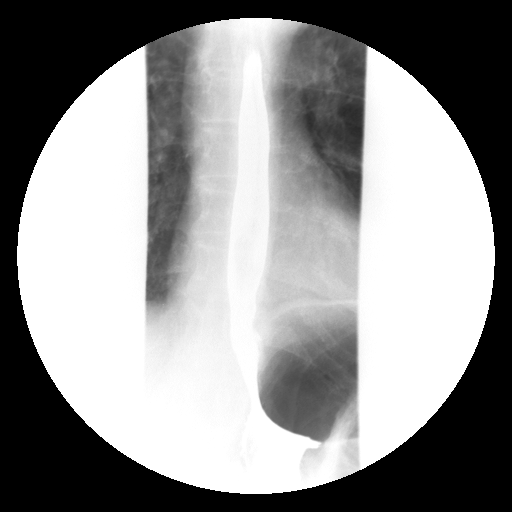

[Series 9: run · 1 of 5 slices shown (7 of 19)]
[im 1/5]
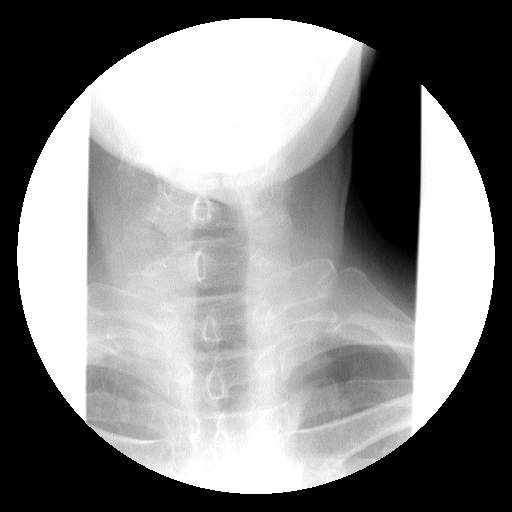

[Series 10: run · 1 of 1 slices shown (8 of 19)]
[im 1/1]
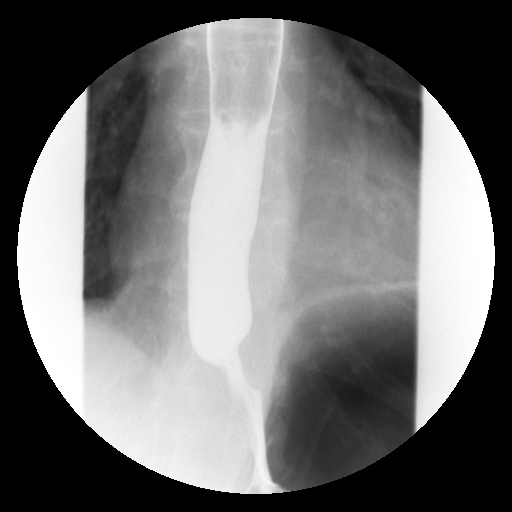

[Series 11: run · 1 of 1 slices shown (9 of 19)]
[im 1/1]
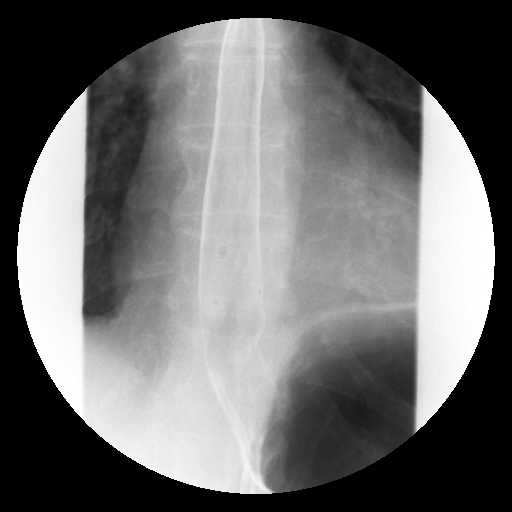

[Series 13: run · 1 of 1 slices shown (10 of 19)]
[im 1/1]
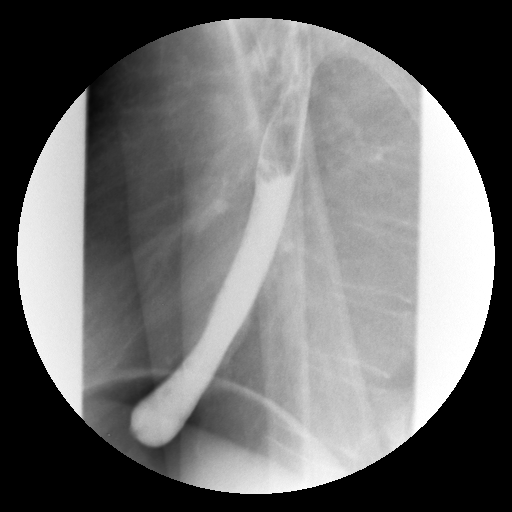

[Series 14: run · 1 of 7 slices shown (11 of 19)]
[im 1/7]
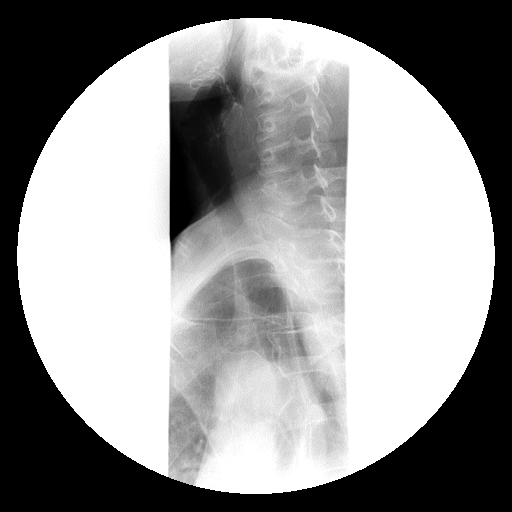

[Series 15: run · 1 of 3 slices shown (12 of 19)]
[im 1/3]
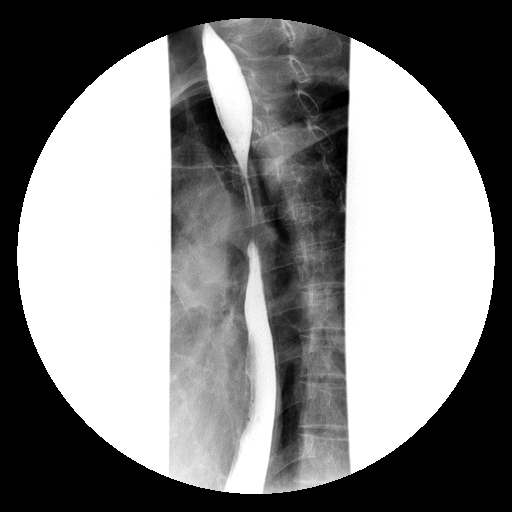

[Series 16: run · 1 of 17 slices shown (13 of 19)]
[im 1/17]
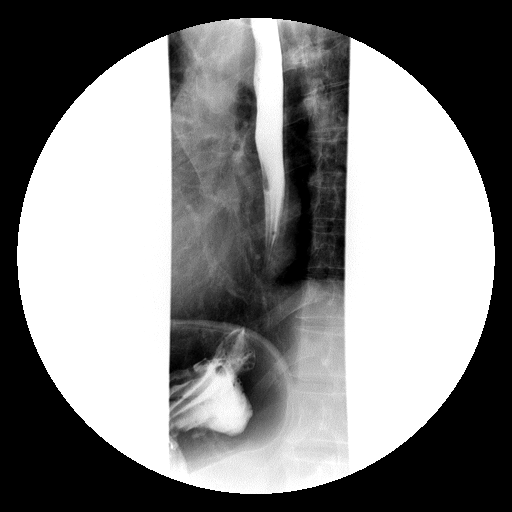

[Series 18: run · 1 of 1 slices shown (14 of 19)]
[im 1/1]
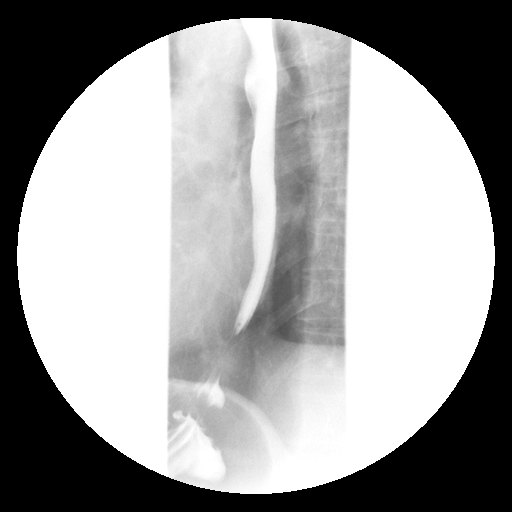

[Series 19: run · 1 of 1 slices shown (15 of 19)]
[im 1/1]
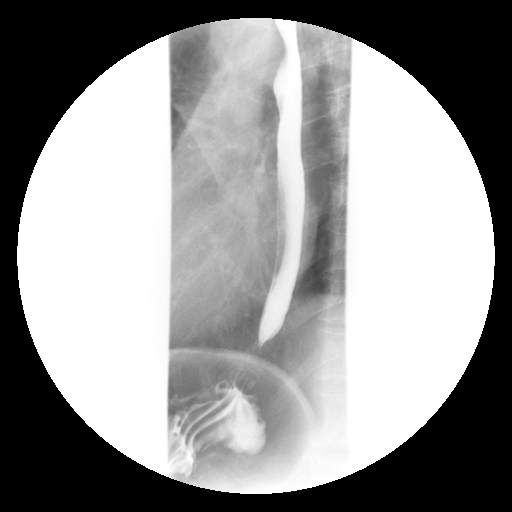

[Series 20: run · 1 of 1 slices shown (16 of 19)]
[im 1/1]
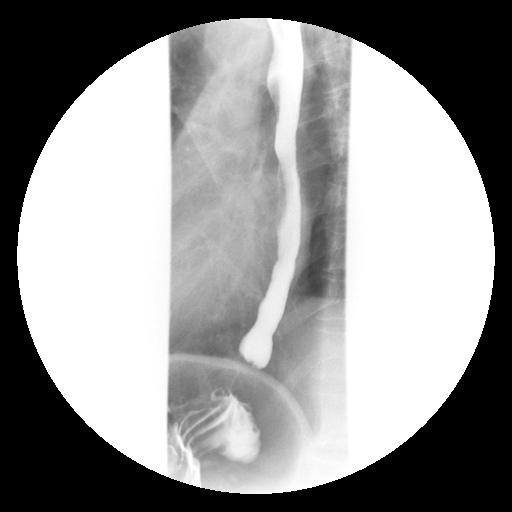

[Series 21: run · 1 of 1 slices shown (17 of 19)]
[im 1/1]
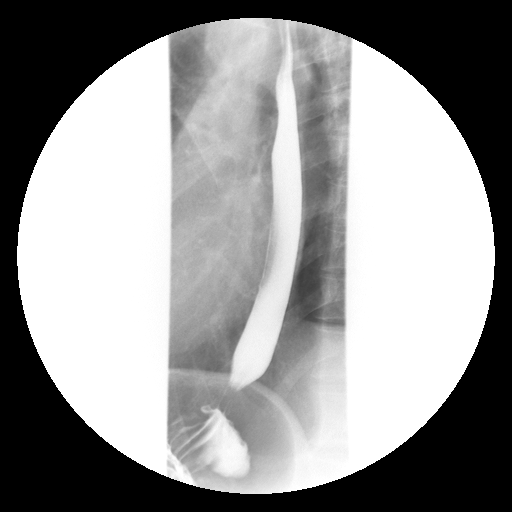

[Series 23: run · 1 of 1 slices shown (18 of 19)]
[im 1/1]
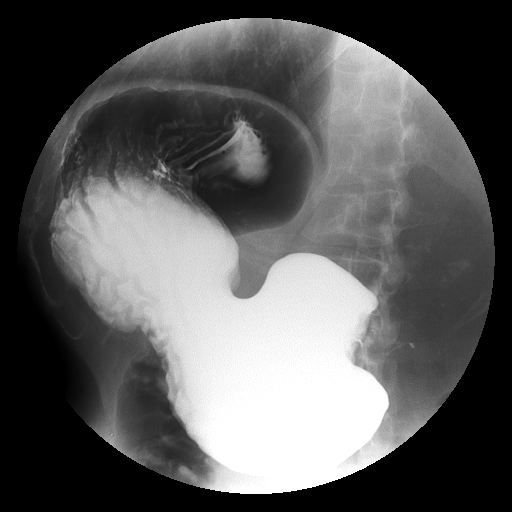

[Series 24: run · 1 of 1 slices shown (19 of 19)]
[im 1/1]
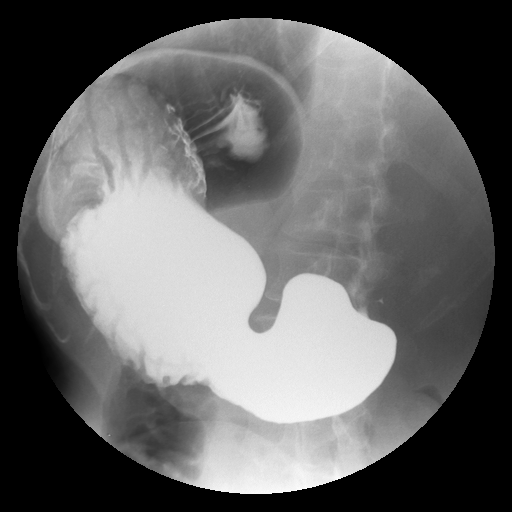

[19 of 24 positions shown; findings below may reference images not displayed]

FINDINGS: Normal antegrade peristalsis is seen through the cervical
and thoracic esophagus with ingested 13 mm barium tablet readily
passed into the stomach.  Previous small sliding type hiatus hernia
currently not seen with no intrinsic or extrinsic esophageal
lesions noted.  Currently no spontaneous or induced (Valsalva/water
siphon) gastroesophageal reflux demonstrated.  Persistent marked
narrowing through the pylorus with partial gastric outlet
obstructive findings seen.  The stomach is otherwise unremarkable.
The duodenum and proximal loops of jejunum appear normal.
IMPRESSION: 1.  Marked persistent narrowing through the pylorus consistent with
probable postinflammatory benign stricture with partial gastric
outlet obstruction.
2.  Otherwise, negative.

## 2011-12-05 DIAGNOSIS — I639 Cerebral infarction, unspecified: Secondary | ICD-10-CM

## 2011-12-05 HISTORY — DX: Cerebral infarction, unspecified: I63.9

## 2011-12-08 ENCOUNTER — Inpatient Hospital Stay: Payer: Self-pay | Admitting: Internal Medicine

## 2011-12-08 DIAGNOSIS — I6789 Other cerebrovascular disease: Secondary | ICD-10-CM

## 2011-12-08 LAB — CBC WITH DIFFERENTIAL/PLATELET
Basophil #: 0.1 10*3/uL (ref 0.0–0.1)
Eosinophil #: 0 10*3/uL (ref 0.0–0.7)
Eosinophil %: 0.9 %
HGB: 12.7 g/dL (ref 12.0–16.0)
Lymphocyte #: 1.7 10*3/uL (ref 1.0–3.6)
MCH: 30.5 pg (ref 26.0–34.0)
MCHC: 33.3 g/dL (ref 32.0–36.0)
MCV: 92 fL (ref 80–100)
Monocyte #: 0.5 x10 3/mm (ref 0.2–0.9)
Neutrophil %: 52.9 %
Platelet: 174 10*3/uL (ref 150–440)
RBC: 4.16 10*6/uL (ref 3.80–5.20)
RDW: 12.3 % (ref 11.5–14.5)

## 2011-12-08 LAB — BASIC METABOLIC PANEL
Anion Gap: 6 — ABNORMAL LOW (ref 7–16)
BUN: 10 mg/dL (ref 7–18)
Chloride: 104 mmol/L (ref 98–107)
Creatinine: 0.7 mg/dL (ref 0.60–1.30)
Potassium: 4.2 mmol/L (ref 3.5–5.1)

## 2011-12-09 DIAGNOSIS — I369 Nonrheumatic tricuspid valve disorder, unspecified: Secondary | ICD-10-CM

## 2011-12-09 LAB — LIPID PANEL
Triglycerides: 39 mg/dL (ref 0–200)
VLDL Cholesterol, Calc: 8 mg/dL (ref 5–40)

## 2012-08-07 ENCOUNTER — Encounter: Payer: Self-pay | Admitting: Cardiovascular Disease

## 2012-09-30 ENCOUNTER — Encounter (HOSPITAL_COMMUNITY): Payer: Self-pay | Admitting: Dentistry

## 2012-10-01 ENCOUNTER — Encounter (HOSPITAL_COMMUNITY): Payer: Self-pay | Admitting: Dentistry

## 2012-10-01 ENCOUNTER — Encounter: Payer: Self-pay | Admitting: Cardiovascular Disease

## 2012-10-01 ENCOUNTER — Ambulatory Visit (INDEPENDENT_AMBULATORY_CARE_PROVIDER_SITE_OTHER): Payer: Medicare Other | Admitting: Cardiovascular Disease

## 2012-10-01 VITALS — BP 128/82 | HR 76 | Ht 64.0 in | Wt 86.0 lb

## 2012-10-01 DIAGNOSIS — R0602 Shortness of breath: Secondary | ICD-10-CM

## 2012-10-01 NOTE — Progress Notes (Signed)
HPI:   57 year-old woman presenting for initial cardiac evaluation. She is the wife of Dr Kristin Bruins. She has a fairly complicated past medical history especially since she has sustained a stroke in July 2013.  She presents today for evaluation of progressive exertional dyspnea. This is been present for about 2 months. Her symptoms seem to beginning worse. She also experiences a pressure across her chest. The chest pressure occurs with exertion. She has rare palpitations but these are not associated with her other symptoms. She denies orthopnea, PND, or leg swelling. She has no history of cardiac disease. She did have a stroke in July 2013 without a clear etiology. She was treated at [facility]. By report there was an echocardiogram done I do not have the results of this. There were no abnormalities communicated to the patient or her husband. She also had a carotid duplex scan which showed no abnormality. She was unable take Plavix because of excessive bleeding and bruising. She does not take regular aspirin because of the aspirin component to her Fiorinal which she takes daily.  The patient has a remote history of mitral valve prolapse. Other symptoms currently include generalized fatigue and tiredness. She's lost a good bit of weight over the last few years. Her appetite is decreased. She's had complications of osteoporosis and this had a thoracic compression fracture.  Outpatient Encounter Prescriptions as of 10/01/2012  Medication Sig Dispense Refill  . butalbital-aspirin-caffeine-codeine (FIORINAL WITH CODEINE) 50-325-40-30 MG capsule Take 2 capsules by mouth every 6 (six) hours as needed for pain.      . divalproex (DEPAKOTE) 125 MG DR tablet Take 125 mg by mouth 2 (two) times daily.      Marland Kitchen esomeprazole (NEXIUM) 40 MG capsule Take 40 mg by mouth daily before breakfast.      . estradiol (ESTRACE) 1 MG tablet Take 1 mg by mouth daily.      Marland Kitchen LORazepam (ATIVAN) 1 MG tablet Take  1 mg by mouth every 8 (eight) hours as needed for anxiety.      . prochlorperazine (COMPAZINE) 5 MG tablet Take 5 mg by mouth every 6 (six) hours as needed for nausea. Taking 1-2 tablets as needed for nausea and vomiting.      . Vitamin D, Ergocalciferol, (DRISDOL) 50000 UNITS CAPS Take 50,000 Units by mouth. Taking 1 capsule twice a week       No facility-administered encounter medications on file as of 10/01/2012.    Promethazine hcl  Past Medical History  Diagnosis Date  . Chronic Migraine Headaches   . Osteoporosis     T6 compression fracture  . GERD (gastroesophageal reflux disease)   . CVA (cerebrovascular accident) 12/05/11    Left CVA(corona radiata); Right sided weakness  . History of peptic ulcer disease   . Partial gastric outlet obstruction 07/04/2006  . 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 Daphine Deutscher  . Postgastric surgery syndromes   . Loss of weight   . Nausea with vomiting   . Chronic back pain     Past Surgical History  Procedure Laterality Date  . Pyloroplasty with vagotomy  06/29/09    S/P pyloroplasty with laparoscopic truncal vagotomy and hiatal hernia repair-07/19/2009 Dr. Daphine Deutscher  . Esophagogastroduodenoscopy  04/13/2009    S/P EGD with dilitation of pylorus-Dr. Ewing Schlein  . Esophagogastroduodenoscopy  11/25/2008    Status post EGD with dilatation Dr. Ewing Schlein  . Esophagogastroduodenoscopy  07/04/2006    S/P EGD and Bx-Dr.  Edwards  . Total abdominal hysterectomy  2000     Hospital-Dr. DeFrancesco  . Septoplasty  1982  . Excision morton's neuroma  1983    Right foot    History   Social History  . Marital Status: Married    Spouse Name: N/A    Number of Children: 0  . Years of Education: N/A   Occupational History  . Not on file.   Social History Main Topics  . Smoking status: Never Smoker   . Smokeless tobacco: Never Used  . Alcohol Use: No  . Drug Use: Not on file  . Sexually Active: Not on file   Other Topics Concern  .  Not on file   Social History Narrative   She is married to Dr. Kristin Bruins. (Dental medicine)   No children.   No history of smoking, drinking alcohol, or other illicit drug use.    Family History  Problem Relation Age of Onset  . Diabetes Mother   . Hypertension Mother   . GER disease Father     ROS:  General: no fevers/chills/night sweats Eyes: no blurry vision, diplopia, or amaurosis ENT: no sore throat or hearing loss Resp: no cough, wheezing, or hemoptysis CV: See history of present illness GU: no dysuria, frequency, or hematuria Skin: no rash Neuro: no headache, numbness, tingling, or weakness of extremities Musculoskeletal: Positive for back pain Heme: no bleeding, DVT, or easy bruising Endo: no polydipsia or polyuria  BP 128/82  Pulse 76  Ht 5\' 4"  (1.626 m)  Wt 39.009 kg (86 lb)  BMI 14.75 kg/m2  SpO2 99%  PHYSICAL EXAM: Pt is alert and oriented, WD, WN, delightful thin woman in no distress. HEENT: normal Neck: JVP normal. Carotid upstrokes normal without bruits. No thyromegaly. Lungs: equal expansion, clear bilaterally CV: Apex is discrete and nondisplaced, RRR without murmur or gallop Abd: soft, NT, +BS, no bruit, no hepatosplenomegaly Back: no CVA tenderness Ext: no C/C/E        DP/PT pulses intact and = Skin: warm and dry without rash Neuro: CNII-XII intact             Strength intact = bilaterally  EKG:  Normal sinus rhythm 76 beats per minute, within normal limits.  ASSESSMENT AND PLAN: 57 year-old woman with history of cryptogenic stroke, now presenting with progressive exertional dyspnea and chest pressure. Her physical exam does not show any evidence of congestive heart failure. She does have a remote history of mitral valve prolapse. Her dyspnea has really become debilitating and further diagnostic testing is clearly indicated. I have recommended a chest x-ray and 2-D echocardiogram to evaluate her shortness of breath. I've recommended an exercise  Myoview stress test to rule out ischemia as a cause of her exertional chest pressure. We'll followup with her after her test results are completed. If everything is normal from a cardiac perspective, I will see her back as needed. If there are abnormalities will schedule her to come back for followup evaluation.  Tonny Bollman 10/01/2012 6:23 PM

## 2012-10-01 NOTE — Patient Instructions (Addendum)
A chest x-ray takes a picture of the organs and structures inside the chest, including the heart, lungs, and blood vessels. This test can show several things, including, whether the heart is enlarges; whether fluid is building up in the lungs; and whether pacemaker / defibrillator leads are still in place.  Your physician has requested that you have an echocardiogram. Echocardiography is a painless test that uses sound waves to create images of your heart. It provides your doctor with information about the size and shape of your heart and how well your heart's chambers and valves are working. This procedure takes approximately one hour. There are no restrictions for this procedure.  Your physician has requested that you have an exercise stress myoview. For further information please visit [email or web address] Please follow instruction sheet, as given.  Your physician recommends that you continue on your current medications as directed. Please refer to the Current Medication list given to you today.  Your physician recommends that you schedule a follow-up appointment as needed.

## 2012-10-11 ENCOUNTER — Other Ambulatory Visit (HOSPITAL_COMMUNITY): Payer: Commercial Managed Care - PPO

## 2012-10-11 ENCOUNTER — Other Ambulatory Visit: Payer: Self-pay

## 2012-10-17 ENCOUNTER — Encounter (HOSPITAL_COMMUNITY): Payer: Commercial Managed Care - PPO

## 2012-10-25 ENCOUNTER — Telehealth: Payer: Self-pay | Admitting: *Deleted

## 2012-10-25 NOTE — Telephone Encounter (Signed)
Information noted.

## 2012-10-25 NOTE — Telephone Encounter (Signed)
A call was made to Mrs. Montavon to reschedule stress myoview that was scheduled for 10/15/12. Per Dr. Kristin Bruins  will call back after she has the echo on 10/29/12 at the [facility] office. Message will be sent to Lauren.

## 2012-10-29 ENCOUNTER — Ambulatory Visit: Payer: Commercial Managed Care - PPO | Admitting: Cardiology

## 2012-10-29 DIAGNOSIS — R0989 Other specified symptoms and signs involving the circulatory and respiratory systems: Secondary | ICD-10-CM

## 2012-12-20 ENCOUNTER — Ambulatory Visit: Payer: Self-pay | Admitting: Cardiovascular Disease

## 2012-12-23 ENCOUNTER — Other Ambulatory Visit: Payer: Self-pay

## 2012-12-23 DIAGNOSIS — R0602 Shortness of breath: Secondary | ICD-10-CM

## 2012-12-31 ENCOUNTER — Other Ambulatory Visit (INDEPENDENT_AMBULATORY_CARE_PROVIDER_SITE_OTHER): Payer: Medicare Other

## 2012-12-31 DIAGNOSIS — R0609 Other forms of dyspnea: Secondary | ICD-10-CM

## 2012-12-31 DIAGNOSIS — R0989 Other specified symptoms and signs involving the circulatory and respiratory systems: Secondary | ICD-10-CM

## 2012-12-31 DIAGNOSIS — I6789 Other cerebrovascular disease: Secondary | ICD-10-CM

## 2013-01-14 ENCOUNTER — Telehealth: Payer: Self-pay | Admitting: Internal Medicine

## 2013-01-14 NOTE — Telephone Encounter (Signed)
Patient is scheduled for 01/15/13 11:00 with Willette Cluster RNP

## 2013-01-14 NOTE — Telephone Encounter (Signed)
Wife (patient) is having LLQ pain - worsening over a few days - no fever.  We will try to get her an appointment late AM tomorrow w/ NP/PA and I will assist - do not think I can fit her into my own schedule but will if something has changed  Please call Dr. Kristin Bruins (dentist at [facility]) at [phone] to arrange things.

## 2013-01-15 ENCOUNTER — Encounter: Payer: Self-pay | Admitting: Nurse Practitioner

## 2013-01-15 ENCOUNTER — Ambulatory Visit (INDEPENDENT_AMBULATORY_CARE_PROVIDER_SITE_OTHER): Payer: Commercial Managed Care - PPO | Admitting: Nurse Practitioner

## 2013-01-15 ENCOUNTER — Other Ambulatory Visit (INDEPENDENT_AMBULATORY_CARE_PROVIDER_SITE_OTHER): Payer: Commercial Managed Care - PPO

## 2013-01-15 VITALS — BP 98/60 | HR 70 | Ht 64.0 in | Wt 88.0 lb

## 2013-01-15 DIAGNOSIS — R1012 Left upper quadrant pain: Secondary | ICD-10-CM

## 2013-01-15 NOTE — Patient Instructions (Addendum)
You have been scheduled for an endoscopy with propofol. Please follow written instructions given to you at your visit today. If you use inhalers (even only as needed), please bring them with you on the day of your procedure. Your physician has requested that you go to www.[email or web address].com and enter the access code given to you at your visit today. This web site gives a general overview about your procedure. However, you should still follow specific instructions given to you by our office regarding your preparation for the procedure.  Dr Leone Payor said to be NPO 6 hours prior to 3:30 PM , which would be 9:30 am  Clear liquids prior to 9:30 am.

## 2013-01-16 ENCOUNTER — Encounter: Payer: Self-pay | Admitting: Nurse Practitioner

## 2013-01-16 LAB — COMPREHENSIVE METABOLIC PANEL
ALT: 36 U/L — ABNORMAL HIGH (ref 0–35)
BUN: 12 mg/dL (ref 6–23)
CO2: 29 mEq/L (ref 19–32)
Calcium: 9.2 mg/dL (ref 8.4–10.5)
Chloride: 103 mEq/L (ref 96–112)
Creatinine, Ser: 0.7 mg/dL (ref 0.4–1.2)
GFR: 87.55 mL/min (ref >60.00)
Glucose, Bld: 89 mg/dL (ref 70–99)
Total Bilirubin: 0.3 mg/dL (ref 0.3–1.2)

## 2013-01-16 LAB — AMYLASE: Amylase: 30 U/L (ref 27–131)

## 2013-01-16 LAB — LIPASE: Lipase: 27 U/L (ref 11.0–59.0)

## 2013-01-16 NOTE — Progress Notes (Signed)
  History of Present Illness:  Patient is a 57 year old female, previously followed by Dr. Ewing Schlein, with a history of persistent pyloric channel ulcer which ultimately resulted in a laparoscopic truncal vagotomy and open pyloroplasty Feb 2011 by Dr. Wenda Low. The ulcer was likely caused by persistent Fiorinal use. Since the surgery patient has not felt well. She has struggled to maintain her weight and has had intermitent problems with post-prandial nausea, vomiting and abdominal pain . Currently her weight is stable, fluctuating a few pounds. Patient saw Dr. Leone Payor in 2012 at which time there was complaints of diarrhea but that isn't really an issue now. She takes laxative as needed. Though her weight has stabilized and bowel movements are somewhat normal, patient's upper abdominal pain seems to be getting progressively worse. She comes in today with acute LUQ pain which started two days ago after eating a salad. This is a new pain, not her chronic upper abdominal pain and she had associated nausea. Pain radiated around left side toward back. No urinary symptoms. Subjective fever. Patient takes chronic pain medication but needed additional pain medication for this acute pain. Hydrocodone helped but then she had a recurrent episode yesterday despite having eaten very little. Yesterday she didn't take any extra pain meds and pain eventually subsided. She feels a little better today.   Current Medications, Allergies, Past Medical History, Past Surgical History, Family History and Social History were reviewed in [employer] record.  Physical Exam: General: Thin white female in no acute distress Head: Normocephalic and atraumatic Eyes:  sclerae anicteric, conjunctiva pink  Ears: Normal auditory acuity Lungs: Clear throughout to auscultation Heart: Regular rate and rhythm Abdomen: Soft, non distended, mild LUQ tenderness. No masses, no hepatomegaly. Normal bowel  sounds Musculoskeletal: Symmetrical with no gross deformities  Extremities: No edema  Neurological: Alert oriented x 4, grossly nonfocal Psychological:  Alert and cooperative. Normal mood and affect  Assessment and Recommendations: Acute LUQ pain. Patient has had progressive upper abdominal pain since gastric surgery. This pain may be exacerbation of chronic pain but feels different to her. Rule out recurrent ulcer (anastomotic ulcer?). Doubt, but should excluded pancreatitis. For further evaluation will obtain some basic labs and schedule patient for EGD with Dr. Leone Payor. The benefits, risks, and potential complications of EGD with possible biopsies and/or dilation were discussed with the patient and she agrees to proceed.

## 2013-01-17 ENCOUNTER — Ambulatory Visit (AMBULATORY_SURGERY_CENTER): Payer: Commercial Managed Care - PPO | Admitting: Internal Medicine

## 2013-01-17 ENCOUNTER — Telehealth: Payer: Self-pay | Admitting: *Deleted

## 2013-01-17 ENCOUNTER — Encounter: Payer: Self-pay | Admitting: Internal Medicine

## 2013-01-17 VITALS — BP 140/77 | HR 63 | Temp 98.3°F | Resp 21 | Ht 64.0 in | Wt 88.0 lb

## 2013-01-17 DIAGNOSIS — K259 Gastric ulcer, unspecified as acute or chronic, without hemorrhage or perforation: Secondary | ICD-10-CM

## 2013-01-17 DIAGNOSIS — R1012 Left upper quadrant pain: Secondary | ICD-10-CM

## 2013-01-17 DIAGNOSIS — K2960 Other gastritis without bleeding: Secondary | ICD-10-CM

## 2013-01-17 DIAGNOSIS — T39395A Adverse effect of other nonsteroidal anti-inflammatory drugs [NSAID], initial encounter: Secondary | ICD-10-CM

## 2013-01-17 HISTORY — DX: Gastric ulcer, unspecified as acute or chronic, without hemorrhage or perforation: T39.395A

## 2013-01-17 HISTORY — DX: Gastric ulcer, unspecified as acute or chronic, without hemorrhage or perforation: K25.9

## 2013-01-17 MED ORDER — ESOMEPRAZOLE MAGNESIUM 40 MG PO CPDR: 40.0000 mg | DELAYED_RELEASE_CAPSULE | Freq: Two times a day (BID) | ORAL | Status: DC

## 2013-01-17 MED ORDER — SODIUM CHLORIDE 0.9 % IV SOLN: 500.0000 mL | INTRAVENOUS | Status: DC

## 2013-01-17 NOTE — Patient Instructions (Addendum)
YOU HAD AN ENDOSCOPIC PROCEDURE TODAY AT THE [facility]: Refer to the procedure report that was given to you for any specific questions about what was found during the examination.  If the procedure report does not answer your questions, please call your gastroenterologist to clarify.  If you requested that your care partner not be given the details of your procedure findings, then the procedure report has been included in a sealed envelope for you to review at your convenience later.  YOU SHOULD EXPECT: Some feelings of bloating in the abdomen. Passage of more gas than usual.  Walking can help get rid of the air that was put into your GI tract during the procedure and reduce the bloating. If you had a lower endoscopy (such as a colonoscopy or flexible sigmoidoscopy) you may notice spotting of blood in your stool or on the toilet paper. If you underwent a bowel prep for your procedure, then you may not have a normal bowel movement for a few days.  DIET: Your first meal following the procedure should be a light meal and then it is ok to progress to your normal diet.  A half-sandwich or bowl of soup is an example of a good first meal.  Heavy or fried foods are harder to digest and may make you feel nauseous or bloated.  Likewise meals heavy in dairy and vegetables can cause extra gas to form and this can also increase the bloating.  Drink plenty of fluids but you should avoid alcoholic beverages for 24 hours.  ACTIVITY: Your care partner should take you home directly after the procedure.  You should plan to take it easy, moving slowly for the rest of the day.  You can resume normal activity the day after the procedure however you should NOT DRIVE or use heavy machinery for 24 hours (because of the sedation medicines used during the test).    SYMPTOMS TO REPORT IMMEDIATELY: A gastroenterologist can be reached at any hour.  During normal business hours, 8:30 AM to 5:00 PM Monday through Friday,  call [phone].  After hours and on weekends, please call the GI answering service at [phone] who will take a message and have the physician on call contact you.   Following upper endoscopy (EGD)  Vomiting of blood or coffee ground material  New chest pain or pain under the shoulder blades  Painful or persistently difficult swallowing  New shortness of breath  Fever of 100F or higher  Black, tarry-looking stools  FOLLOW UP: If any biopsies were taken you will be contacted by phone or by letter within the next 1-3 weeks.  Call your gastroenterologist if you have not heard about the biopsies in 3 weeks.  Our staff will call the home number listed on your records the next business day following your procedure to check on you and address any questions or concerns that you may have at that time regarding the information given to you following your procedure. This is a courtesy call and so if there is no answer at the home number and we have not heard from you through the emergency physician on call, we will assume that you have returned to your regular daily activities without incident.  SIGNATURES/CONFIDENTIALITY: You and/or your care partner have signed paperwork which will be entered into your electronic medical record.  These signatures attest to the fact that that the information above on your After Visit Summary has been reviewed and is understood.  Full responsibility  of the confidentiality of this discharge information lies with you and/or your care-partner.   I found an ulcer at the pylorus - it looks benign but I took biopsies to see if we can learn anything about the cause. My best guess would be Fiorinal causing this. For now will increase the Nexium 40 mg to twice a day. If you can avoid Fiorinal you should and you may need an alternative therapy.  Biopsy results will be in next week and we will call.  I appreciate the opportunity to care for you. Iva Boop, MD,  [facility]

## 2013-01-17 NOTE — Telephone Encounter (Signed)
Via fax from [facility] prior auth needed for Nexium  I called [phone] to start prior auth  Per Shanda Bumps they will fax over a form that needs to be filled out and faxed to them to start prior auth Patients id number on insurance is [id number] Form takes 24 hrs to get to [place]  I called patient at home and left message for patient to call the office back I only have no sample bottles of Nexium but was going to advise patient she can purchase Nexium over the counter until we can get prior auth done.

## 2013-01-17 NOTE — Op Note (Signed)
[facility] [address] [place], [address]   ENDOSCOPY PROCEDURE REPORT  PATIENT: Alexandra, Lewis  MR#: [id number] BIRTHDATE: 04-12-1956 , 57  yrs. old GENDER: Female ENDOSCOPIST: Iva Boop, MD, [facility] PROCEDURE DATE:  01/17/2013 PROCEDURE:  EGD w/ biopsy ASA CLASS:     Class II INDICATIONS:  abdominal pain in upper left quadrant. MEDICATIONS: Propofol (Diprivan), MAC sedation, administered by CRNA, and These medications were titrated to patient response per physician's verbal order TOPICAL ANESTHETIC: Cetacaine Spray  DESCRIPTION OF PROCEDURE: After the risks benefits and alternatives of the procedure were thoroughly explained, informed consent was obtained.  The LB [id number] endoscope was introduced through the mouth and advanced to the second portion of the duodenum. Without limitations.  The instrument was slowly withdrawn as the mucosa was fully examined.   STOMACH: A medium sized round, shallow and clean-based ulcer was found at the pylorus.  Biopsies were taken at edge of the ulcer, at the center of the ulcer and around the ulcer.   A pyloroplasty was found at the pylorus.  DUODENUM: Food residue was found in the 2nd part of the duodenum.  The remainder of the upper endoscopy exam was otherwise normal. Retroflexed views revealed no abnormalities.     The scope was then withdrawn from the patient and the procedure completed.  COMPLICATIONS: There were no complications. ENDOSCOPIC IMPRESSION: 1.   Medium sized ulcer was found at the pylorus 2.   Pyloroplasty was found at the pylorus 3.   Food residue was found in the 2nd part of the duodenum 4.   The remainder of the upper endoscopy exam was otherwise normal  RECOMMENDATIONS: 1.  Office will call with results 2.   Increase Nexium to 40 mg bid and avoid Fiorinal if possible.   eSigned:  Iva Boop, MD, [facility] 01/17/2013 4:13 PM   CC: West Pugh, MD [facility]) and The  Patient

## 2013-01-17 NOTE — Progress Notes (Signed)
Called to room to assist during endoscopic procedure.  Patient ID and intended procedure confirmed with present staff. Received instructions for my participation in the procedure from the performing physician.  

## 2013-01-17 NOTE — Progress Notes (Signed)
Patient did not have preoperative order for IV antibiotic SSI prophylaxis. (G8918)  Patient did not experience any of the following events: a burn prior to discharge; a fall within the facility; wrong site/side/patient/procedure/implant event; or a hospital transfer or hospital admission upon discharge from the facility. (G8907)  

## 2013-01-17 NOTE — Progress Notes (Signed)
Agree with Ms. Guenther's assessment and plan. Satoshi Kalas E. Aldwin Micalizzi, MD, FACG   

## 2013-01-21 ENCOUNTER — Telehealth: Payer: Self-pay | Admitting: *Deleted

## 2013-01-21 ENCOUNTER — Encounter: Payer: Self-pay | Admitting: *Deleted

## 2013-01-21 NOTE — Telephone Encounter (Signed)
I received via fax letter from [facility] that prior auth is not needed, claims are paying I called [facility] and they state when they run RX it comes back, at maximum quantity cannot be filled..  I call [facility] at [phone] and speak with [facility].. Per Patsy Lager with [facility], RX will not go through at [facility] because RX is at [facility] and has already been filled... I called [facility] and spoke with [place], per [place] prescription is filled and waiting on patient to come pick up. They contacted patient and left message with patient.. I called patient and left message for patient to call the office back.

## 2013-01-21 NOTE — Telephone Encounter (Signed)
Received prior auth form  Filled out and faxed back Per Pattie [place], CMA patient does have samples of Nexium Waiting on patient to return call so I may discuss with her that we are waiting on prior auth

## 2013-01-21 NOTE — Telephone Encounter (Signed)
  Follow up Call-  Call back number 01/17/2013  Post procedure Call Back phone  # [phone] or [phone]  Permission to leave phone message Yes     Patient questions:  Message left to call us if necessary.

## 2013-01-30 ENCOUNTER — Other Ambulatory Visit: Payer: Self-pay | Admitting: Internal Medicine

## 2013-01-30 DIAGNOSIS — E538 Deficiency of other specified B group vitamins: Secondary | ICD-10-CM

## 2013-01-30 MED ORDER — CYANOCOBALAMIN 1000 MCG/ML IJ SOLN: 1000.0000 ug | INTRAMUSCULAR | Status: DC

## 2013-01-30 NOTE — Progress Notes (Signed)
Quick Note:  Results reviewed w/ Husband He related she has had borderline low B12 and nottolerating oral supplements so i rxed injectable 1000 ug monthly  She is f/u PCP trying to get off salicylates and get better pain control  Advised to f/u me as needed ______

## 2013-03-26 ENCOUNTER — Encounter: Payer: Self-pay | Admitting: Internal Medicine

## 2013-03-26 ENCOUNTER — Telehealth: Payer: Self-pay | Admitting: Internal Medicine

## 2013-03-26 DIAGNOSIS — T39095A Adverse effect of salicylates, initial encounter: Secondary | ICD-10-CM

## 2013-03-26 DIAGNOSIS — T39095S Adverse effect of salicylates, sequela: Secondary | ICD-10-CM

## 2013-03-26 DIAGNOSIS — K259 Gastric ulcer, unspecified as acute or chronic, without hemorrhage or perforation: Secondary | ICD-10-CM

## 2013-03-26 HISTORY — DX: Adverse effect of salicylates, initial encounter: T39.095A

## 2013-03-26 MED ORDER — PANTOPRAZOLE SODIUM 40 MG PO TBEC: 40.0000 mg | DELAYED_RELEASE_TABLET | Freq: Two times a day (BID) | ORAL | Status: DC

## 2013-03-26 NOTE — Telephone Encounter (Signed)
Would like pantoprazole Rx She is reducing Fiorinal but still using some  Has NSAIF ulcer  Will refill PPI bid, change to pantoprazole 40 mg

## 2013-03-26 NOTE — Assessment & Plan Note (Signed)
Change to pantoprazole 40 mg bid Reduce fiornal further and stop if at all possible

## 2013-03-27 ENCOUNTER — Other Ambulatory Visit: Payer: Self-pay

## 2013-04-30 ENCOUNTER — Other Ambulatory Visit (HOSPITAL_COMMUNITY): Payer: Self-pay | Admitting: Dentistry

## 2013-05-09 ENCOUNTER — Encounter (HOSPITAL_COMMUNITY): Payer: Self-pay | Admitting: Dentistry

## 2013-05-09 ENCOUNTER — Ambulatory Visit (HOSPITAL_COMMUNITY): Payer: Commercial Managed Care - PPO | Admitting: Dentistry

## 2013-05-09 VITALS — BP 119/78 | HR 83 | Temp 97.6°F

## 2013-05-09 NOTE — Patient Instructions (Signed)
Return to clinic as scheduled for periodontal recall care. Call if problems arise before then. Dr. Kristin Bruins

## 2013-05-09 NOTE — Progress Notes (Signed)
05/09/2013  Patient:            Alexandra Lewis Date of Birth:  1955/10/11 MRN:                [id number]  BP 119/78  Pulse 83  Temp(Src) 97.6 F (36.4 C)   Alexandra Lewis is a 57 year old female that presents for periodic oral exam, dental radiograph, and evaluation of upper right molar. Patient was last seen for periodic oral examination and periodontal care. Patient recently noted a fractured upper right molar tooth #2. Patient currently denies acute symptoms associated with this tooth. Patient presents for periodical examination, dental radiographs, and evaluation for treatment of the upper right fractured tooth.   Medical Hx Update:  Past Medical History  Diagnosis Date  . Chronic Migraine Headaches   . Osteoporosis     T6 compression fracture  . GERD (gastroesophageal reflux disease)   . CVA (cerebrovascular accident) 12/05/11    Left CVA(corona radiata); Right sided weakness  . History of peptic ulcer disease   . Partial gastric outlet obstruction 07/04/2006  . 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 Daphine Deutscher  . Postgastric surgery syndromes   . Loss of weight   . Chronic back pain   . Adverse effect of salicylate- ulcer 03/26/2013  . 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  ALLERGIES/ADVERSE DRUG REACTIONS: Allergies  Allergen Reactions  . Promethazine Hcl     Dystonic reaction    MEDICATIONS: Current Outpatient Prescriptions  Medication Sig Dispense Refill  . butalbital-aspirin-caffeine-codeine (FIORINAL WITH CODEINE) 50-325-40-30 MG capsule Take 2 capsules by mouth every 6 (six) hours as needed for pain.      . cyanocobalamin (,VITAMIN B-12,) 1000 MCG/ML injection Inject 1 mL (1,000 mcg total) into the skin every 30 (thirty) days.  10 mL  1  . divalproex (DEPAKOTE) 125 MG DR tablet Take 125 mg by mouth 2 (two) times daily.      Marland Kitchen estradiol (ESTRACE) 1 MG tablet Take 1 mg by mouth daily.      Marland Kitchen LORazepam (ATIVAN) 1 MG tablet  Take 1 mg by mouth every 8 (eight) hours as needed for anxiety.      . pantoprazole (PROTONIX) 40 MG tablet Take 1 tablet (40 mg total) by mouth 2 (two) times daily before a meal. Breakfast and supper  60 tablet  11  . prochlorperazine (COMPAZINE) 5 MG tablet Take 5 mg by mouth every 6 (six) hours as needed for nausea. Taking 1-2 tablets as needed for nausea and vomiting.      . Vitamin D, Ergocalciferol, (DRISDOL) 50000 UNITS CAPS Take 50,000 Units by mouth. Taking 1 capsule twice a week       No current facility-administered medications for this visit.    DENTAL EXAM: General: The patient is a well-developed, slightly build female in no acute distress. Vitals: BP 119/78  Pulse 83  Temp(Src) 97.6 F (36.4 C) Extraoral Exam:  There is no palpable lymphadenopathy.  The patient denies acute TMJ Symptoms. Intraoral  Exam: The patient has normal saliva. There are no soft tissue lesions. Dentition: The patient is missing tooth numbers 1, 16, 19, 30. Patient has had previous orthodontic therapy moving tooth numbers 17 and 18 and tooth numbers 31 and 32 mesially to close diastemas. Periodontal: The patient has chronic periodontitis with plaque and calculus accumulations, selective areas gingival recession and no significant tooth mobility. Caries:  Dental caries #2 and #4. Endodontic:  Patient currently denies  acute pulpitits symptoms. There do not appear to be any periapical radiolucencies. C&B: Patient has crown the tooth numbers 3, 18, and 31. The patient also has porcelain veneers from tooth numbers 5 through 12. Prosthodontic: No partial dentures. Occlusion:  The occlusion is stable at this time. Radiographic Interpretation: An orthopantogram was taken and supplemented with a full series of dental radiographs. There are multiple missing teeth. There is supra-eruption and drifting of the unopposed teeth into the edentulous areas. There is incipient to moderate bone loss. There are no obvious  periapical radiolucencies. Dental caries #2 on the mesial and #4 on the distal were noted.  Assessments: 1. Dental caries #2 and #4. 2. Chronic periodontitis with incipient to moderate bone loss 3. No significant tooth mobility 4. Selective areas of gingival recession 5. Accretions 6. History of orthodontic therapy with extraction of tooth numbers 19 and 30 and closure of space as indicated. 7. Stable occlusion  Plan:  The patient agrees to proceed with periodontal therapy and dental restorations today.  The patient will then return to clinic in approximately 6 months for a routine periodontal care in maintenance procedures.  Procedure: 72 xylocaine with epi .036 via infiltration. Z6109    #2 MO/Amalgam, Gluma, Tytin alloy. Deep but no exposure. Consider crown. U0454    #4 DO/Amalgam Gluma, tytin alloy. Deep, no exposure. Risk for fracture. Needs crown ideally. U9811 Cavitron prophy and hand scalers. [place]. Floss.  Patient tolerated procedures well.  Charlynne Pander, DDS

## 2014-08-13 DIAGNOSIS — Z1231 Encounter for screening mammogram for malignant neoplasm of breast: Secondary | ICD-10-CM | POA: Diagnosis not present

## 2014-09-08 NOTE — Discharge Summary (Signed)
PATIENT NAME:  Alexandra Lewis, Alexandra Lewis MR#:  [id number] DATE OF BIRTH:  Jun 06, 1955  DATE OF ADMISSION:  12/08/2011 DATE OF DISCHARGE:  12/09/2011  ADMITTING DIAGNOSIS: Respiratory distress.   DISCHARGE DIAGNOSES:  1. Cerebrovascular accident in left corona radiata with right-sided weakness with history of transient ischemic attack with expressive aphasia.  2. Hyperlipidemia.  3. Migraine headaches. 4. Chronic pain syndrome. 5. Bipolar disorder.   DISCHARGE CONDITION: Stable.   DISCHARGE MEDICATIONS: The patient is to resume her outpatient medications which are:  1. Nexium 40 mg p.o. daily.  2. Estradiol 1 mg p.o. daily.  3. Depakote ER 125 mg p.o. daily.  4. Lorazepam 1 mg p.o. every eight hours as needed. 5. Compazine 10 mg every six hours as needed.  6. Vitamin D 1 tablet p.o. twice a week.   ADDITIONAL MEDICATIONS:  1. Fioricet with codeine 1 tablet every four hours as needed.  2. Plavix 75 mg p.o. daily.  3. Lipitor 10 mg p.o. at bedtime.  4. The patient was advised not to take Fiorinal as well as Zomig.   DIET: Two gram salt, low fat, low cholesterol, regular consistency.   ACTIVITY LIMITATIONS: As tolerated.   REFERRAL: Home health physical therapy.   FOLLOWUP: Appointment with Dr. Ronnell Guadalajara in two days after discharge.   CONSULTANT: Dr. Manuella Ghazi.  LABORATORY, RADIOLOGICAL AND DIAGNOSTIC DATA: CT of head without contrast 12/08/2011 revealed old ischemic changes without other abnormalities. MRI of the brain without contrast on 12/08/2011 revealed acute stroke in left corona radiata. Carotid ultrasound Doppler bilateral 0719/2013 showed no hemodynamically significant stenosis. Echocardiogram done on 12/09/2011 showed no source of cerebrovascular accident noted. Otherwise, normal study. Left ventricular systolic function is normal. Ejection fraction equal or more than 55%. The left ventricular systolic function is normal. The left atrial size is normal. Right ventricular  systolic pressure is normal.   HISTORY: The patient is a 59 year old Caucasian female with past medical history significant for history of bipolar disorder, history of migraine headaches, who presented to the hospital with right-sided weakness. Please refer to Dr. Governor Specking admission note on 12/08/2011.   PHYSICAL EXAMINATION: On arrival to the hospital, the patient's temperature was 98.4, pulse 66, respiratory rate was not measured, blood pressure 125/80, saturation was 99% on room air. Physical exam revealed weakness in the right upper as well as lower extremities. Power was around one out of five in the right hand and right leg. Sensation was also diminished to touch on the right hand and right leg. The patient's left side was intact. Power five out of five and normal sensation in the left side. Cranial nerves grossly intact. The patient had also apraxia and inability to write with right hand. She also has history of falls.   LABORATORY, RADIOLOGICAL AND DIAGNOSTIC DATA: Lab data done on 12/08/2011 revealed normal BMP. The patient's CBC was also normal.   HOSPITAL COURSE: The patient was admitted to the hospital for further evaluation. Because of concerns of stroke, she underwent evaluation. She had MRI of her brain as well as carotid ultrasound and an echocardiogram. MRI of brain showed left-sided infarct which was acute. The patient was started on aspirin initially in the hospital. However, it appeared that at home she was taking already high doses of aspirin, which were included in her Fiorinal. So, decision was made to change the patient's Fiorinal, which contains aspirin, into Fioricet which contains Acetaminophen and then add Plavix. This was approved by Dr. Manuella Ghazi, neurologist, who saw the patient in consultation. The  patient also had a lipid panel checked and she was found to be hyperlipidemic. Her LDL was found to be 89. The patient's total cholesterol was 202. The patient's triglycerides were 39  and HDL was 105. It was felt that she would benefit from low dose of Lipitor to keep her LDL even lower at below 70. It is recommended to follow the patient's lipid panels as outpatient to make sure that her goal is met.   The patient was evaluated by a physical therapist while she was in the hospital and physical therapist felt that the patient should be discharged with assist versus home with home health. I discussed the patient's condition as well as her discharge issues with her husband and he also felt that the patient is safe to be discharged home. She did quite well with physical therapy, so the patient will be discharged to home with home health physical therapy. She is to follow-up with her primary care physician for further recommendations.   In regards to her chronic medical problems such as her migraine headaches, as mentioned above, her medications were somewhat changed. Instead of her Fiorinal as well as Zomig, the patient will be receiving Fioricet which has just Acetaminophen with Codeine. She is not to take Zomig until further recommended by her primary care physician or neurologist for her migraines.   In regards to chronic pain syndrome, as mentioned above, she is to continue codeine as needed. For bipolar disorder, no changes were made. The patient is to continue Depakote. The patient is being discharged in stable condition with the above-mentioned medications and follow-up. Her vital signs revealed temperature 98.5 on the day of discharge. Heart rate was 60s to 70s. Respiratory rate was 18 to 20, blood pressure 120/82, saturation was 95 to 99% on room air at rest.   TIME SPENT: 40 minutes.   ____________________________ Theodoro Grist, MD rv:ap D: 12/09/2011 18:54:39 ET T: 12/11/2011 11:37:34 ET JOB#: [id number]  cc: Theodoro Grist, MD, <Dictator> Dr. Ronnell Guadalajara Balbina Depace MD ELECTRONICALLY SIGNED 12/13/2011 16:25

## 2014-09-08 NOTE — H&P (Signed)
PATIENT NAME:  Alexandra Lewis, Alexandra Lewis MR#:  [id number] DATE OF BIRTH:  07/08/1955  DATE OF ADMISSION:  12/08/2011   PRIMARY DOCTOR: From [facility], Dr Ronnell Guadalajara NEUROLOGIST: Dr. Manuella Ghazi.   CHIEF COMPLAINT: Right-sided weakness.   HISTORY OF PRESENT ILLNESS: A 59 year old female sent in from Dr. Guinevere Scarlet office because of possible stroke. The patient went to Dr. Manuella Ghazi for right-sided weakness, started on Tuesday around 8:30 a.m. Patient is noted to have numbness and weakness of the right hand. She thought she had slept on that side and felt weak then noted to have heaviness and weakness in the right leg and speech also was slurred. Patient also had falls because of that and was dragging the right leg. Patient complains of slurred speech few weeks ago and after that same time she had a severe headache which was not like a migraine, and she had to take Zomig and the slurred speech resolved, and patient had this weakness going on since Tuesday.  Patient thought it probably related to migraines and somehow did not seek medical attention since Tuesday and went to see Dr. Manuella Ghazi.  He noted her to have severe weakness on the right side and slurred speech and unable to use the right hand so he sent her for evaluation of stroke. The patient denies any weakness in the face, no loss of consciousness, no seizure activity and mainly weak on the right hand and right foot and leg.  Has headaches mainly on the left side with aura, occasional  occupational blurred vision with headaches and speech is slurred but swallowing is not affected according to her.   PAST MEDICAL HISTORY: Significant for migraines, history of peptic ulcer disease status post pyloroplasty and vagotomy in 2011. Since then she has this dumping syndrome and loss of appetite and weight loss. She also has history of osteoporosis with history of T6 compression fracture, gastroesophageal reflux disease, and chronic back pain.   SURGICAL HISTORY:  Significant for pyloroplasty with vagotomy, history of total abdominal hysterectomy with bilateral salpingo-oophorectomy in 2000 and partial gastric outlet obstruction. Patient also has history of Morton neuroma removal in the right foot in 1983.   FAMILY HISTORY: Mother has diabetes and hypertension and hyperlipidemia. Father had GERD, and paternal grandmother had stroke.    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  Married.  Husband works at [employer], and he is a Mudlogger there.    SOCIAL HISTORY: No smoking. No drinking. No drugs.   ALLERGIES: Allergic to Phenergan, gives dystonia, and also allergic to tape.  MEDICATIONS: 1. Estradiol 1 mg daily.  2. Depakote ER 125 mg p.o. t.i.d. 3. Zomig 5 mg half to 1 tablet p.r.n. for headache.  4. Nexium 40 mg 1 to 2 tablets daily. 5. Compazine 5 mg 1 to 2 tablets every 6 hours p.r.n.  6. Ativan 1 mg p.r.n.  7. Fiorinal with codeine 1 to 2 tablets q.6 hours p.r.n.  8. Vitamin D 50,000 units 2 times a week.   REVIEW OF SYSTEMS: CONSTITUTIONAL: Weakness on the right side. EYES: No blurred vision at this time. ENT: Has no tinnitus. No epistaxis. No difficulty swallowing. RESPIRATORY: No cough. CARDIOVASCULAR: No chest pain. No orthopnea. GASTROINTESTINAL: Has chronic gastroesophageal reflux disease and has no appetite. GENITOURINARY: No dysuria. ENDOCRINE: No polyuria or nocturia. INTEGUMENTARY: No skin rashes. MUSCULOSKELETAL: Joint pains mainly in the back. NEUROLOGIC: Weakness and numbness in the right side. The patient has no epilepsy. Has a  history of migraines. PSYCHIATRIC: Has history of bipolar disorder.   PHYSICAL EXAMINATION:  VITAL SIGNS: Temperature 98.4, pulse 66, blood pressure 125/80, sats 99% on room air.   GENERAL: She is alert, awake, oriented, answering questions appropriately.  Has slurred speech, not in distress.   HEENT: Head atraumatic, normocephalic. Pupils equally reacting to  light. Extraocular movements are intact. ENT: Patient has no tympanic membrane congestion. No turbinate hypertrophy. No oropharyngeal erythema.   NECK: Normal range of motion. No JVD. No carotid bruit.   CARDIOVASCULAR: S1 and S2 regular. No murmurs.   LUNGS: Clear to auscultation. No wheeze. No rales.   ABDOMEN: Soft, nontender, nondistended. Bowel sounds present.   EXTREMITIES: No extremity edema. No cyanosis. No clubbing.   NEUROLOGIC: Weakness on the right upper and lower legs. Power is around 1 out of 5 in the right hand and right leg. Sensations are also diminished to touch on the right hand and right leg. The patient's left side is intact. Power 5.5 and normal sensations on the left side. Cranial nerves II through XII intact. 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 The patient has had history of falls.  LABORATORY DATA: Lab data is pending. CT head is done which showed no mass, no hydrocephalus, no hemorrhage, lucency in the left corona radiata  consistent with lacunar infarct, most likely old, no acute abnormality.   ASSESSMENT AND PLAN:  1. This is a 59 year old female with bipolar disorder and migraines, and gastroesophageal reflux disease, came in for right-sided weakness, sent in from Dr. Trena Platt office. Right-sided weakness started 2 days ago and progressed so she had a fall also at home so she went to see Dr. Manuella Ghazi and sent here. She does definitely have weakness on the right side, and CT head showed some chronic strokes. She had slurred speech 3 weeks ago, may the stroke actually happened at that time, so she is going to be admitted to the hospital and get an MRI of the brain along with carotid ultrasound and echocardiogram. Dr. Manuella Ghazi will be seeing the patient tomorrow, do the neurological check, obtain fasting lipase. Physical therapy evaluation and speech therapy evaluation. Continue aspirin for her stroke prevention.  2. Blood pressure is at goal. I do not  think we need to do anything for that.  3. Migraines and history of bipolar disorder. Continue p.o. home meds for migraines and continue Depakote for bipolar disorder.  4. The patient's condition is stable. We will discuss with the patient's wife in detail.           TIME SPENT: About 60 minutes.     ____________________________ Epifanio Lesches, MD sk:vtd D: 12/08/2011 18:30:12 ET T: 12/08/2011 20:06:38 ET JOB#: [id number]  cc: Epifanio Lesches, MD, <Dictator> [place] K. Manuella Ghazi, MD Epifanio Lesches MD ELECTRONICALLY SIGNED 12/27/2011 13:48

## 2014-09-08 NOTE — Consult Note (Signed)
PATIENT NAME:  Alexandra, Lewis MR#:  [id number] DATE OF BIRTH:  09-Oct-1955  DATE OF CONSULTATION:  12/09/2011  REFERRING PHYSICIAN:  Epifanio Lesches, MD  CONSULTING PHYSICIAN:  Daryle Amis K. Manuella Ghazi, MD PRIMARY CARE PHYSICIAN: Dr. Ronnell Guadalajara, [facility]  REASON FOR CONSULTATION: Stroke.   HISTORY OF PRESENT ILLNESS: Alexandra Lewis is a 60 year old right-handed Caucasian female who is an outpatient of mine. She had an acute onset of right-sided weakness around 8:30 on Tuesday. Initially she noticed her right leg was very heavy and right arm was very heavy and numb. She had some speech difficulty. The patient has a history of bipolar disease, and her husband is a dentist who felt like she needed to go to the ER, but the patient did not want to go to the ER.   As the patient continued to have worsening of her weakness, they called my office and wanted to be seen right away. I saw her yesterday and advised them to get admitted. The patient has some interim improvement in her right arm and leg weakness as well as her speech seems to be better. The patient had an interim MRI of the brain which showed subcortical infarct. The patient does have a history of slurred speech a few weeks ago which lasted for around 10 minutes.   The patient also has a history of chronic migraine, and she also has a history of being on birth control medications. Otherwise, she does not have multiple vascular risk factors such as diabetes, hypertension, hyperlipidemia. She does not smoke. She did have some fluttering sensation, but they have not found any atrial fibrillation on telemetry monitoring so far. The patient does not take aspirin on a regular basis, but she takes Fiorinal very scheduled basis, so she practically takes 650 aspirin every day.    PAST MEDICAL HISTORY: Significant for: 1. Migraine.  2. History of peptic ulcer disease, status post pyeloplasty and vagotomy in 2011.  3. She has a dumping syndrome  as well as significant loss of appetite and weight loss.  4. She has a history of osteoporosis and T6 compression fracture.  5. Gastroesophageal reflux disease.  6. Chronic back pain.    PAST SURGICAL HISTORY: Significant for: 1. Pyeloplasty and vagotomy.  2. Total abdominal hysterectomy with salpingo-oophorectomy in 2000.  3. Partial gastric outlet obstruction.  4. History of Morton's neuroma in right foot in 1983.   FAMILY HISTORY: Significant that mother has diabetes and hypertension and hyperlipidemia. Father had a history of gastroesophageal reflux disease and who committed suicide. The patient's paternal grandmother had a stroke.   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 Her husband works at [employer]. He is a Mudlogger there.   SOCIAL HISTORY: Significant for no smoking, no drinking, no drug use.   ALLERGIES: She is allergic to Phenergan, which caused her to have dystonia, and she is allergic to tape.   HOME MEDICATIONS:  I reviewed her home medication list.   REVIEW OF SYSTEMS: Positive for right-sided weakness, some speech difficulty, not having any appetite, some low back pain. Otherwise, 10-system review of systems was asked and was found to be negative.   PHYSICAL EXAMINATION:  VITAL SIGNS: Her temperature is 98.5, pulse 64, respiratory rate 20, blood pressure 120/82. Pulse oximetry 95%.   GENERAL: She is a thin, cachectic, pale-looking Caucasian female, but she has very perked up mood today.   LUNGS: Clear to auscultation.   HEART: S1, S2 heart sounds. Carotid  exam did not reveal any bruit.   EXTREMITIES: The patient has very cachectic-looking hands.   ABDOMEN: The patient has an abdominal scar.   LUNGS: Clear to auscultation.   HEART: S1, S2 heart sounds. Carotid exam did not reveal any bruit.   NEUROLOGIC: She was alert, oriented, followed two-step inverted commands. Her language seems to be intact today. Her attention,  concentration, and fund of knowledge seems to be appropriate. Her memory was okay. She was able to provide the history.   On her motor exam, she has good tone in bilateral upper and lower extremities. She still has some apraxia of the right hand such that she had difficulty doing complex movement of her right hand. She had difficulty doing thumb-edge-palm, but she was able to pretend well about how to brush her teeth and how to comb her hair etc.   She does have very minimal pronator drift.   She has some strength in her right lower extremity, but she had 4- out of 5 strength in hip flexion. Knee extension and knee flexion looks like 4+ out of 5. Her dorsiflexion of the foot was also 4+ out of 5. Her deep tendon reflexes were +3 all over.   She does not have an upgoing toe compared to yesterday she had an upgoing toe.   LABORATORY, DIAGNOSTIC AND RADIOLOGICAL DATA: On her MRI of the brain, she does have left MCA distribution, lenticulostriate branch EWI hyperintensity suggestive of ischemia. Otherwise, she does not have other significant white matter microvascular ischemic changes.   ASSESSMENT AND PLAN:  1.  Acute ischemic stroke, likely happened on Tuesday, 12/05/2011: Her right-sided weakness seems to be getting better. It was a left MCA M1 territory lenticulostriate branch lacunar infarct. She was using aspirin in terms of Fiorinal use, so I will switch her to Fioricet and start her on a Plavix 75 mg p.o. daily. Her lipid is borderline. I will give her a  very low dose atorvastatin. Her echocardiogram has been unremarkable. Her carotid ultrasound is unremarkable. On her telemetry monitoring, she has not any atrial fibrillation. In terms of physical therapy, she was recommended to have outpatient PT with the Home [facility]. She cleared her swallow evaluation, and she was not a TPA candidate due to she presented after many days.   In terms of long-term suggestions, I talked to them about Walk-Aide,  which is a peroneal nerve-stimulating device for patients with a foot drop after a stroke. I also talked to them about a device called Menta Move.  She has some right upper extremity and lower extremity weakness and apraxia that she might benefit from mirror therapy. I told them to look up on You-Tube, Dr. Ashby Dawes and mirror therapy. I  will see them back as an outpatient in 2 to 3 weeks.   2.  Chronic migraine headaches. She has been tried on multiple different prophylactic medications, including Topamax, Depakote, beta blockers, calcium channel blockers, Elavil, etc. She has been to the [facility] in [place]. As a rescue medication these days, she is taking Zomig that she should not use for short-term due to her recent stroke. She can take Fioricet for now.   I talked to them about supplemented Dolovent, which is a combination of multiple nutrients including magnesium, riboflavin and co-Q10 which might be helpful.   I will also send a copy of this note to Dr. Lethea Killings, who runs a clinical trial for [facility] and who might be able to help her with getting  the [employer].    I will see her back as an outpatient in 2 to 3 weeks.   ____________________________ Royetta Crochet. Manuella Ghazi, MD hks:cbb D: 12/09/2011 15:41:51 ET T: 12/10/2011 14:43:45 ET JOB#: [id number]  cc: Fraya Ueda K. Manuella Ghazi, MD, <Dictator> [place], Dr. Ronnell Guadalajara [facility] MD ELECTRONICALLY SIGNED 12/11/2011 7:21

## 2014-11-25 DIAGNOSIS — R0602 Shortness of breath: Secondary | ICD-10-CM | POA: Diagnosis not present

## 2014-11-25 DIAGNOSIS — R5383 Other fatigue: Secondary | ICD-10-CM | POA: Diagnosis not present

## 2015-03-31 DIAGNOSIS — G43119 Migraine with aura, intractable, without status migrainosus: Secondary | ICD-10-CM | POA: Diagnosis not present

## 2015-03-31 DIAGNOSIS — Z23 Encounter for immunization: Secondary | ICD-10-CM | POA: Diagnosis not present

## 2015-05-10 ENCOUNTER — Other Ambulatory Visit: Payer: Self-pay | Admitting: Neurology

## 2015-05-10 DIAGNOSIS — Z8673 Personal history of transient ischemic attack (TIA), and cerebral infarction without residual deficits: Secondary | ICD-10-CM

## 2015-05-18 ENCOUNTER — Ambulatory Visit
Admission: RE | Admit: 2015-05-18 | Discharge: 2015-05-18 | Disposition: A | Discharge status: Home / Self Care | Payer: 59 | Source: Ambulatory Visit | Attending: Neurology | Admitting: Neurology

## 2015-05-18 DIAGNOSIS — G43119 Migraine with aura, intractable, without status migrainosus: Secondary | ICD-10-CM | POA: Diagnosis not present

## 2015-05-18 DIAGNOSIS — Z8673 Personal history of transient ischemic attack (TIA), and cerebral infarction without residual deficits: Secondary | ICD-10-CM | POA: Diagnosis not present

## 2015-05-18 DIAGNOSIS — H538 Other visual disturbances: Secondary | ICD-10-CM | POA: Diagnosis not present

## 2015-05-18 DIAGNOSIS — R51 Headache: Secondary | ICD-10-CM | POA: Diagnosis not present

## 2015-05-18 MED ORDER — GADOBENATE DIMEGLUMINE 529 MG/ML IV SOLN
10.0000 mL | Freq: Once | INTRAVENOUS | Status: AC | PRN
  Administered 2015-05-18: 8 mL via INTRAVENOUS

## 2015-06-02 MED FILL — PANTOPRAZOLE SOD DR 40 MG T: 40 | 90 days supply | Qty: 90 | Fill #0

## 2015-06-24 DIAGNOSIS — G43119 Migraine with aura, intractable, without status migrainosus: Secondary | ICD-10-CM | POA: Diagnosis not present

## 2015-09-14 MED FILL — PANTOPRAZOLE SOD DR 40 MG T: 40 | 90 days supply | Qty: 90 | Fill #0

## 2015-09-27 MED FILL — PROCHLORPERAZINE 5 MG TAB: 5 | 7 days supply | Qty: 60 | Fill #1

## 2015-11-18 DIAGNOSIS — Z1283 Encounter for screening for malignant neoplasm of skin: Secondary | ICD-10-CM | POA: Diagnosis not present

## 2015-11-18 DIAGNOSIS — L820 Inflamed seborrheic keratosis: Secondary | ICD-10-CM | POA: Diagnosis not present

## 2015-11-18 DIAGNOSIS — L812 Freckles: Secondary | ICD-10-CM | POA: Diagnosis not present

## 2015-11-18 DIAGNOSIS — D1800 Hemangioma unspecified site: Secondary | ICD-10-CM | POA: Diagnosis not present

## 2015-11-18 DIAGNOSIS — D485 Neoplasm of uncertain behavior of skin: Secondary | ICD-10-CM | POA: Diagnosis not present

## 2015-11-18 DIAGNOSIS — L578 Other skin changes due to chronic exposure to nonionizing radiation: Secondary | ICD-10-CM | POA: Diagnosis not present

## 2015-11-18 DIAGNOSIS — D229 Melanocytic nevi, unspecified: Secondary | ICD-10-CM | POA: Diagnosis not present

## 2015-11-18 DIAGNOSIS — L719 Rosacea, unspecified: Secondary | ICD-10-CM | POA: Diagnosis not present

## 2015-11-18 DIAGNOSIS — D2261 Melanocytic nevi of right upper limb, including shoulder: Secondary | ICD-10-CM | POA: Diagnosis not present

## 2015-11-18 DIAGNOSIS — L821 Other seborrheic keratosis: Secondary | ICD-10-CM | POA: Diagnosis not present

## 2015-11-18 MED FILL — PROCHLORPERAZINE 5 MG TAB: 5 | 7 days supply | Qty: 60 | Fill #2

## 2015-12-13 MED FILL — PANTOPRAZOLE SOD DR 40 MG T: 40 | 30 days supply | Qty: 30 | Fill #1

## 2015-12-27 ENCOUNTER — Encounter (HOSPITAL_COMMUNITY): Payer: Self-pay | Admitting: Dentistry

## 2015-12-29 ENCOUNTER — Encounter: Payer: Self-pay | Admitting: Internal Medicine

## 2015-12-29 ENCOUNTER — Ambulatory Visit (INDEPENDENT_AMBULATORY_CARE_PROVIDER_SITE_OTHER): Payer: 59 | Admitting: Internal Medicine

## 2015-12-29 VITALS — BP 108/62 | HR 64 | Temp 97.7°F | Resp 16 | Ht 63.5 in | Wt 91.0 lb

## 2015-12-29 DIAGNOSIS — E538 Deficiency of other specified B group vitamins: Secondary | ICD-10-CM | POA: Diagnosis not present

## 2015-12-29 DIAGNOSIS — K259 Gastric ulcer, unspecified as acute or chronic, without hemorrhage or perforation: Secondary | ICD-10-CM | POA: Diagnosis not present

## 2015-12-29 DIAGNOSIS — T39095S Adverse effect of salicylates, sequela: Secondary | ICD-10-CM

## 2015-12-29 DIAGNOSIS — E785 Hyperlipidemia, unspecified: Secondary | ICD-10-CM

## 2015-12-29 DIAGNOSIS — E559 Vitamin D deficiency, unspecified: Secondary | ICD-10-CM

## 2015-12-29 DIAGNOSIS — Z79899 Other long term (current) drug therapy: Secondary | ICD-10-CM | POA: Diagnosis not present

## 2015-12-29 DIAGNOSIS — F39 Unspecified mood [affective] disorder: Secondary | ICD-10-CM

## 2015-12-29 DIAGNOSIS — R634 Abnormal weight loss: Secondary | ICD-10-CM

## 2015-12-29 DIAGNOSIS — G43001 Migraine without aura, not intractable, with status migrainosus: Secondary | ICD-10-CM

## 2015-12-29 MED ORDER — PANTOPRAZOLE SODIUM 40 MG PO TBEC: 40.0000 mg | DELAYED_RELEASE_TABLET | Freq: Every day | ORAL | 3 refills | Status: DC

## 2015-12-29 MED ORDER — DIVALPROEX SODIUM 125 MG PO DR TAB: 125.0000 mg | DELAYED_RELEASE_TABLET | Freq: Two times a day (BID) | ORAL | 5 refills | Status: DC

## 2015-12-29 MED ORDER — PROCHLORPERAZINE MALEATE 5 MG PO TABS: ORAL_TABLET | ORAL | 5 refills | Status: DC

## 2015-12-29 MED ORDER — LORAZEPAM 1 MG PO TABS: 1.0000 mg | ORAL_TABLET | Freq: Three times a day (TID) | ORAL | 2 refills | Status: DC | PRN

## 2015-12-29 MED ORDER — BUTALBITAL-ASA-CAFF-CODEINE 50-325-40-30 MG PO CAPS: 2.0000 | ORAL_CAPSULE | ORAL | 2 refills | Status: DC | PRN

## 2015-12-29 MED ORDER — ZOLMITRIPTAN 2.5 MG PO TABS: ORAL_TABLET | ORAL | 5 refills | Status: DC

## 2015-12-29 MED ORDER — VITAMIN D (ERGOCALCIFEROL) 1.25 MG (50000 UNIT) PO CAPS: ORAL_CAPSULE | ORAL | 5 refills | Status: DC
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
## 2015-12-29 NOTE — Patient Instructions (Addendum)
Reduce the depakote to every other day for 1 week,  Then every 3 days for one  week then stop  Lorazepam refilled  at #30/month  Since you are using it prn  I have refilled the fiorinal   For 6/daily  Liver enzymes and vit d today

## 2015-12-29 NOTE — Progress Notes (Signed)
Pre visit review using our clinic review tool, if applicable. No additional management support is needed unless otherwise documented below in the visit note. 

## 2015-12-30 DIAGNOSIS — Z79899 Other long term (current) drug therapy: Secondary | ICD-10-CM | POA: Insufficient documentation

## 2015-12-30 LAB — COMPREHENSIVE METABOLIC PANEL
ALT: 10 U/L (ref 0–35)
AST: 14 U/L (ref 0–37)
Albumin: 4.6 g/dL (ref 3.5–5.2)
Alkaline Phosphatase: 124 U/L — ABNORMAL HIGH (ref 39–117)
BILIRUBIN TOTAL: 0.3 mg/dL (ref 0.2–1.2)
BUN: 10 mg/dL (ref 6–23)
CO2: 30 meq/L (ref 19–32)
CREATININE: 0.96 mg/dL (ref 0.40–1.20)
Calcium: 9.8 mg/dL (ref 8.4–10.5)
Chloride: 102 mEq/L (ref 96–112)
GFR: 63.17 mL/min (ref >60.00)
GLUCOSE: 90 mg/dL (ref 70–99)
Potassium: 4.6 mEq/L (ref 3.5–5.1)
SODIUM: 139 meq/L (ref 135–145)
Total Protein: 6.7 g/dL (ref 6.0–8.3)

## 2015-12-30 LAB — CBC WITH DIFFERENTIAL/PLATELET
BASOS ABS: 0 10*3/uL (ref 0.0–0.1)
BASOS PCT: 0.6 % (ref 0.0–3.0)
Eosinophils Absolute: 0 10*3/uL (ref 0.0–0.7)
Eosinophils Relative: 1 % (ref 0.0–5.0)
HCT: 41.7 % (ref 36.0–46.0)
Hemoglobin: 14.1 g/dL (ref 12.0–15.0)
LYMPHS ABS: 1.3 10*3/uL (ref 0.7–4.0)
LYMPHS PCT: 28.3 % (ref 12.0–46.0)
MCHC: 33.8 g/dL (ref 30.0–36.0)
MCV: 88.4 fl (ref 78.0–100.0)
Monocytes Absolute: 0.3 10*3/uL (ref 0.1–1.0)
Monocytes Relative: 7.1 % (ref 3.0–12.0)
NEUTROS ABS: 2.9 10*3/uL (ref 1.4–7.7)
NEUTROS PCT: 63 % (ref 43.0–77.0)
Platelets: 234 10*3/uL (ref 150.0–400.0)
RBC: 4.72 Mil/uL (ref 3.87–5.11)
RDW: 13.3 % (ref 11.5–15.5)
WBC: 4.6 10*3/uL (ref 4.0–10.5)

## 2015-12-30 LAB — LIPID PANEL
Cholesterol: 252 mg/dL — ABNORMAL HIGH (ref 0–200)
HDL: 104.3 mg/dL (ref >39.00)
LDL CALC: 132 mg/dL — AB (ref 0–99)
NONHDL: 147.72
Total CHOL/HDL Ratio: 2
Triglycerides: 79 mg/dL (ref 0.0–149.0)
VLDL: 15.8 mg/dL (ref 0.0–40.0)

## 2015-12-30 LAB — VITAMIN B12: VITAMIN B 12: 723 pg/mL (ref 211–911)

## 2015-12-30 LAB — VITAMIN D 25 HYDROXY (VIT D DEFICIENCY, FRACTURES): VITD: 19.3 ng/mL — ABNORMAL LOW (ref 30.00–100.00)

## 2015-12-30 LAB — LDL CHOLESTEROL, DIRECT: Direct LDL: 112 mg/dL

## 2015-12-30 LAB — FOLATE RBC: RBC Folate: 525 ng/mL (ref >280)

## 2015-12-30 LAB — TSH: TSH: 1.54 u[IU]/mL (ref 0.35–4.50)

## 2015-12-30 NOTE — Assessment & Plan Note (Addendum)
Secondary to vagus nerve damage.  Checking electrolytes and vitamin d

## 2015-12-30 NOTE — Assessment & Plan Note (Addendum)
Occurring nearly daily, managed with zomig and fiorinal . Tapering depakot to off

## 2015-12-30 NOTE — Assessment & Plan Note (Signed)
She is using lorazepam on a prn basis, less than once daily .

## 2015-12-30 NOTE — Assessment & Plan Note (Signed)
Liver enzymes normal.  contineu fiorinal  Lab Results  Component Value Date   ALT 10 12/29/2015   AST 14 12/29/2015   ALKPHOS 124 (H) 12/29/2015   BILITOT 0.3 12/29/2015

## 2015-12-31 LAB — METHYLMALONIC ACID, SERUM: METHYLMALONIC ACID, QUANT: 317 nmol/L (ref 87–318)

## 2016-01-01 ENCOUNTER — Encounter: Payer: Self-pay | Admitting: Internal Medicine

## 2016-01-08 MED FILL — PROCHLORPERAZINE 5 MG TAB: 5 | 7 days supply | Qty: 60 | Fill #3

## 2016-01-10 MED FILL — LORazepam 1 MG TABS: 1 | 10 days supply | Qty: 30 | Fill #0

## 2016-01-10 MED FILL — PANTOPRAZOLE SOD DR 40 MG T: 40 | 90 days supply | Qty: 90 | Fill #2

## 2016-02-15 ENCOUNTER — Ambulatory Visit: Payer: Medicare Other | Admitting: Internal Medicine

## 2016-02-15 ENCOUNTER — Telehealth: Payer: Self-pay | Admitting: Internal Medicine

## 2016-02-15 NOTE — Telephone Encounter (Signed)
Let me know if you need me to cancel appt. Thank you!

## 2016-02-15 NOTE — Telephone Encounter (Signed)
FYI, Pt husband called stating pt will not be able to make appt due to a real bad headache. Thank you!

## 2016-02-15 NOTE — Telephone Encounter (Signed)
Do you have anyone we can fill the spot.

## 2016-02-15 NOTE — Telephone Encounter (Signed)
Does she want to cancel or leave on schedule?

## 2016-02-15 NOTE — Telephone Encounter (Signed)
Ok

## 2016-02-15 NOTE — Telephone Encounter (Signed)
Leave on schedule.

## 2016-02-23 MED FILL — PROCHLORPERAZINE 5 MG TAB: 5 | 8 days supply | Qty: 60 | Fill #0

## 2016-03-03 NOTE — Telephone Encounter (Signed)
YOU CAN WAIVE THE NO SHOW FEE THIS TIME

## 2016-03-03 NOTE — Telephone Encounter (Signed)
Per Juliann Pulse she wanted this to be sent to you. There was no pt to put on sch to fill spot that's why it was left on sch.  Pt husband called about a No show fee. Dr Derrel Nip do you want to leave as a No show or get it removed?  Let me know?  Call husband @ [phone]

## 2016-03-03 NOTE — Telephone Encounter (Signed)
See below.  Thank you

## 2016-03-06 NOTE — Telephone Encounter (Signed)
Information sent to charge correction to remove no show fee.

## 2016-03-17 ENCOUNTER — Telehealth: Payer: Self-pay | Admitting: Internal Medicine

## 2016-03-17 MED ORDER — BUTALBITAL-ASA-CAFF-CODEINE 50-325-40-30 MG PO CAPS: 2.0000 | ORAL_CAPSULE | ORAL | 2 refills | Status: DC | PRN

## 2016-03-17 NOTE — Telephone Encounter (Signed)
Had to reschedule appt from 11/1 to 11/06 as per Dr. Lupita Dawn request. Pt will run out of her butalbital-aspirin-caffeine-codeine ([place]) 50-325-40-30 MG capsule. She will need a refill before appt.

## 2016-03-17 NOTE — Telephone Encounter (Signed)
Ok to refill.  printed 

## 2016-03-17 NOTE — Telephone Encounter (Signed)
Refill request for Fiorina with Codeine, last [id number], last filled [id number].  Please advise.

## 2016-03-21 MED FILL — PANTOPRAZOLE SOD DR 40 MG T: 40 | 90 days supply | Qty: 90 | Fill #3

## 2016-03-22 ENCOUNTER — Ambulatory Visit: Payer: Medicare Other | Admitting: Internal Medicine

## 2016-03-27 ENCOUNTER — Other Ambulatory Visit: Payer: Self-pay | Admitting: Internal Medicine

## 2016-03-27 ENCOUNTER — Ambulatory Visit: Payer: Medicare Other | Admitting: Internal Medicine

## 2016-03-31 ENCOUNTER — Encounter: Payer: Self-pay | Admitting: Internal Medicine

## 2016-03-31 ENCOUNTER — Ambulatory Visit (INDEPENDENT_AMBULATORY_CARE_PROVIDER_SITE_OTHER): Payer: 59 | Admitting: Internal Medicine

## 2016-03-31 VITALS — BP 124/70 | HR 79 | Temp 98.0°F | Ht 64.0 in | Wt 93.2 lb

## 2016-03-31 DIAGNOSIS — Z79899 Other long term (current) drug therapy: Secondary | ICD-10-CM

## 2016-03-31 DIAGNOSIS — F39 Unspecified mood [affective] disorder: Secondary | ICD-10-CM

## 2016-03-31 DIAGNOSIS — T39095S Adverse effect of salicylates, sequela: Secondary | ICD-10-CM

## 2016-03-31 DIAGNOSIS — E538 Deficiency of other specified B group vitamins: Secondary | ICD-10-CM

## 2016-03-31 DIAGNOSIS — K259 Gastric ulcer, unspecified as acute or chronic, without hemorrhage or perforation: Secondary | ICD-10-CM

## 2016-03-31 DIAGNOSIS — Z23 Encounter for immunization: Secondary | ICD-10-CM

## 2016-03-31 DIAGNOSIS — T39395A Adverse effect of other nonsteroidal anti-inflammatory drugs [NSAID], initial encounter: Secondary | ICD-10-CM

## 2016-03-31 DIAGNOSIS — R634 Abnormal weight loss: Secondary | ICD-10-CM

## 2016-03-31 LAB — COMPREHENSIVE METABOLIC PANEL
ALK PHOS: 110 U/L (ref 33–130)
ALT: 12 U/L (ref 6–29)
AST: 16 U/L (ref 10–35)
Albumin: 4.4 g/dL (ref 3.6–5.1)
BUN: 15 mg/dL (ref 7–25)
CALCIUM: 9.1 mg/dL (ref 8.6–10.4)
CHLORIDE: 100 mmol/L (ref 98–110)
CO2: 26 mmol/L (ref 20–31)
Creat: 0.77 mg/dL (ref 0.50–1.05)
Glucose, Bld: 80 mg/dL (ref 65–99)
POTASSIUM: 4.6 mmol/L (ref 3.5–5.3)
Sodium: 137 mmol/L (ref 135–146)
TOTAL PROTEIN: 6.5 g/dL (ref 6.1–8.1)
Total Bilirubin: 0.3 mg/dL (ref 0.2–1.2)

## 2016-03-31 MED ORDER — CYANOCOBALAMIN 1000 MCG/ML IJ SOLN: 1000.0000 ug | INTRAMUSCULAR | 5 refills | Status: DC

## 2016-03-31 MED ORDER — MIRTAZAPINE 15 MG PO TBDP: 15.0000 mg | ORAL_TABLET | Freq: Every day | ORAL | 1 refills | Status: DC

## 2016-03-31 MED ORDER — DIVALPROEX SODIUM 125 MG PO DR TAB: 125.0000 mg | DELAYED_RELEASE_TABLET | Freq: Every day | ORAL | 2 refills | Status: DC

## 2016-03-31 MED ORDER — "INSULIN SYRINGE/NEEDLE 28G X 1/2"" 1 ML MISC": 3 refills | Status: AC

## 2016-03-31 MED ORDER — ERGOCALCIFEROL 1.25 MG (50000 UT) PO CAPS: 50000.0000 [IU] | ORAL_CAPSULE | ORAL | 3 refills | Status: DC

## 2016-03-31 MED ORDER — DIAZEPAM 2 MG PO TABS: 2.0000 mg | ORAL_TABLET | Freq: Four times a day (QID) | ORAL | 5 refills | Status: DC | PRN

## 2016-03-31 MED ORDER — PANTOPRAZOLE SODIUM 40 MG PO TBEC: 40.0000 mg | DELAYED_RELEASE_TABLET | Freq: Two times a day (BID) | ORAL | 3 refills | Status: DC

## 2016-03-31 MED FILL — DEPAKOTE DR 125 MG TABLET: 125 | 90 days supply | Qty: 90 | Fill #0

## 2016-03-31 MED FILL — MIRTAZAPINE 15 MG ODT: 15 | 90 days supply | Qty: 90 | Fill #0

## 2016-03-31 MED FILL — CYANOCOBALAMIN 1,000 MCG/ML: 1000 | 84 days supply | Qty: 6 | Fill #0

## 2016-03-31 MED FILL — VIT D2 1.25 MG (50,000 UNIT: 1.25 MG | 84 days supply | Qty: 12 | Fill #0

## 2016-03-31 NOTE — Progress Notes (Signed)
Pre visit review using our clinic review tool, if applicable. No additional management support is needed unless otherwise documented below in the visit note. 

## 2016-03-31 NOTE — Progress Notes (Signed)
Subjective:  Patient ID: Alexandra Lewis, female    DOB: 04-05-1956  Age: 60 y.o. MRN: [id number]  CC: The primary encounter diagnosis was Long-term use of high-risk medication. Diagnoses of Adverse effect of salicylate, sequela, 123456 deficiency, Encounter for immunization, WEIGHT LOSS-ABNORMAL, Episodic mood disorder ([place]), and NSAID-induced gastric ulcer- pylorus were also pertinent to this visit.  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  Multiple medication changes were requested and made at first visit in August.  Wanted to suspend depakote.  2 week taper done.  Did not tolerate suspension of depakote, mood becoe much less calm. Now  Back to Once daily dosing  Currently    Trial of Ativan made her  ANGRY, VERY EASILY IRRITATED . Would like to resume low dose  VALIUM 2 MG AS NEEDED.  Was using it less than once daily   GERD was worse for a while.  Had to use protonix bid for 3 weeks  ,  NOW BACK TO ONCE DAILY,  NEEDS RX RE WRITTEN  Could not tolerate daily Vitamin D supplements, but can tolerate weekly Drisdol .   b12 deficiency managed with b12 injections       Outpatient Medications Prior to Visit  Medication Sig Dispense Refill  . butalbital-aspirin-caffeine-codeine (FIORINAL WITH CODEINE) 50-325-40-30 MG capsule Take 2 capsules by mouth every 4 (four) hours as needed for pain. 180 capsule 2  . divalproex (DEPAKOTE) 125 MG DR tablet Take 1 tablet (125 mg total) by mouth 2 (two) times daily. 60 tablet 5  . prochlorperazine (COMPAZINE) 5 MG tablet Taking 1-2 tablets every 6 hours as needed for nausea and vomiting. 60 tablet 5  . Vitamin D, Ergocalciferol, (DRISDOL) 50000 units CAPS capsule Taking 1 capsule twice a week 24 capsule 5  . ZOLMitriptan (ZOMIG) 2.5 MG tablet 1/2 to 1 tablet  as needed for migraine headaches 8 tablet 5  . cyanocobalamin (,VITAMIN B-12,) 1000 MCG/ML injection Inject 1 mL (1,000 mcg total) into the skin every 30 (thirty) days. 10 mL 1  . LORazepam (ATIVAN) 1 MG tablet Take 1 tablet (1 mg total) by mouth every 8 (eight) hours as needed for anxiety. 30 tablet 2  . pantoprazole (PROTONIX) 40 MG tablet Take 1 tablet (40 mg total) by mouth daily. Breakfast and supper 90 tablet 3   No facility-administered medications prior to visit.     Review of Systems;  Patient denies headache, fevers, malaise, unintentional weight loss, skin rash, eye pain, sinus congestion and sinus pain, sore throat, dysphagia,  hemoptysis , cough, dyspnea, wheezing, chest pain, palpitations, orthopnea, edema, abdominal pain, nausea, melena, diarrhea, constipation, flank pain, dysuria, hematuria, urinary  Frequency, nocturia, numbness, tingling, seizures,  Focal weakness, Loss of consciousness,  Tremor, insomnia, depression, anxiety, and suicidal ideation.      Objective:  BP 124/70   Pulse 79   Temp 98 F (36.7 C) (Oral)   Ht 5\' 4"  (1.626 m)   Wt 93 lb 3.2 oz (42.3 kg)   SpO2 97%   BMI 16.00 kg/m   BP Readings from Last 3 Encounters:  03/31/16 124/70  12/29/15 108/62  05/09/13 119/78    Wt Readings from Last 3 Encounters:  03/31/16 93 lb 3.2 oz (42.3 kg)  12/29/15 91 lb (41.3 kg)  01/17/13 88 lb (39.9 kg)    General appearance: alert,  cooperative and appears stated age Ears: normal TM's and external ear canals both ears Throat: lips, mucosa, and tongue normal; teeth and gums normal Neck: no adenopathy, no carotid bruit, supple, symmetrical, trachea midline and thyroid not enlarged, symmetric, no tenderness/mass/nodules Back: symmetric, no curvature. ROM normal. No CVA tenderness. Lungs: clear to auscultation bilaterally Heart: regular rate and rhythm, S1, S2 normal, no murmur, click, rub or gallop Abdomen: soft, non-tender; bowel sounds normal; no masses,  no  organomegaly Pulses: 2+ and symmetric Skin: Skin color, texture, turgor normal. No rashes or lesions Lymph nodes: Cervical, supraclavicular, and axillary nodes normal.  No results found for: HGBA1C  Lab Results  Component Value Date   CREATININE 0.77 03/31/2016   CREATININE 0.96 12/29/2015   CREATININE 0.7 01/15/2013    Lab Results  Component Value Date   WBC 4.6 12/29/2015   HGB 14.1 12/29/2015   HCT 41.7 12/29/2015   PLT 234.0 12/29/2015   GLUCOSE 80 03/31/2016   CHOL 252 (H) 12/29/2015   TRIG 79.0 12/29/2015   HDL 104.30 12/29/2015   LDLDIRECT 112.0 12/29/2015   LDLCALC 132 (H) 12/29/2015   ALT 12 03/31/2016   AST 16 03/31/2016   NA 137 03/31/2016   K 4.6 03/31/2016   CL 100 03/31/2016   CREATININE 0.77 03/31/2016   BUN 15 03/31/2016   CO2 26 03/31/2016   TSH 1.54 12/29/2015    Mr Brain W Wo Contrast  Result Date: 05/18/2015 CLINICAL DATA:  60 year old female with posterior headaches with visual aura. Symptoms for 6 months. Initial encounter. 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 EXAM: MRI HEAD WITHOUT AND WITH CONTRAST TECHNIQUE: Multiplanar, multiecho pulse sequences of the brain and surrounding structures were obtained without and with intravenous contrast. CONTRAST:  41mL MULTIHANCE GADOBENATE DIMEGLUMINE 529 MG/ML IV SOLN COMPARISON:  Brain MRI 12/08/2011. FINDINGS: No restricted diffusion to suggest acute infarction. No midline shift, mass effect, evidence of mass lesion, ventriculomegaly, extra-axial collection or acute intracranial hemorrhage. Cervicomedullary junction and pituitary are within normal limits. Negative visualized cervical spine. Major intracranial vascular flow voids are stable. Cerebral volume is stable since 2013. Expected evolution of the small posterior left corona radiata lacunar infarct. No associated hemosiderin. No cortical encephalomalacia, and elsewhere Stable and normal for age gray and white matter signal. No abnormal  enhancement identified. No dural thickening identified. Visible internal auditory structures appear normal. Mastoids are clear. Mild paranasal sinus mucosal thickening is stable. Orbit and scalp soft tissues are within normal limits. IMPRESSION: 1.  No acute intracranial abnormality. 2. Negative for age MRI appearance of the brain aside from expected chronic appearance of the 2013 posterior left corona radiata lacunar infarct. Electronically Signed   By: Genevie Ann M.D.   On: 05/18/2015 16:19    Assessment & Plan:   Problem List Items Addressed This Visit    Episodic mood disorder ([place])    She has resumed once daily depakot and dd not tolerate lorazepam .  Valium 2 mg prn. Refilled.       WEIGHT LOSS-ABNORMAL    Secondary to vagus nerve damage.  She is willing to try remeron for appetite stimulation        NSAID-induced gastric ulcer- pylorus    Recent escalation of symptoms resulting in bid dosing of protonix.  rx rewritten for bid dosing when needed.       Long-term use of high-risk medication - Primary    Liver enzymes normal. Needs hepatic function panel and cbc in 6 weeks  Now  that Depakot has been resumed.  Lab Results  Component Value Date   ALT 12 03/31/2016   AST 16 03/31/2016   ALKPHOS 110 03/31/2016   BILITOT 0.3 03/31/2016    Lab Results  Component Value Date   WBC 4.6 12/29/2015   HGB 14.1 12/29/2015   HCT 41.7 12/29/2015   MCV 88.4 12/29/2015   PLT 234.0 12/29/2015          Relevant Orders   Comprehensive metabolic panel (Completed)   Comprehensive metabolic panel   CBC with Differential/Platelet   Adverse effect of salicylate- ulcer   Relevant Medications   pantoprazole (PROTONIX) 40 MG tablet    Other Visit Diagnoses    B12 deficiency       Relevant Medications   cyanocobalamin (,VITAMIN B-12,) 1000 MCG/ML injection   Encounter for immunization       Relevant Orders   Flu Vaccine QUAD 36+ mos IM (Completed)      I have discontinued Ms.  Seidel's LORazepam. I have also changed her divalproex and cyanocobalamin. Additionally, I am having her start on diazepam, ergocalciferol, INS SYRINGE/NEEDLE 1CC/28G, and mirtazapine. Lastly, I am having her maintain her ZOLMitriptan, Vitamin D (Ergocalciferol), divalproex, prochlorperazine, butalbital-aspirin-caffeine-codeine, and pantoprazole.  Meds ordered this encounter  Medications  . divalproex (DEPAKOTE) 125 MG DR tablet    Sig: Take 1 tablet (125 mg total) by mouth daily. DO NOT SUBSTITUTE    Dispense:  90 tablet    Refill:  2  . diazepam (VALIUM) 2 MG tablet    Sig: Take 1 tablet (2 mg total) by mouth every 6 (six) hours as needed for anxiety.    Dispense:  30 tablet    Refill:  5  . DISCONTD: pantoprazole (PROTONIX) 40 MG tablet    Sig: Take 1 tablet (40 mg total) by mouth 2 (two) times daily before a meal. Breakfast and supper    Dispense:  180 tablet    Refill:  3  . pantoprazole (PROTONIX) 40 MG tablet    Sig: Take 1 tablet (40 mg total) by mouth 2 (two) times daily before a meal. Breakfast and supper    Dispense:  180 tablet    Refill:  3  . ergocalciferol (DRISDOL) 50000 units capsule    Sig: Take 1 capsule (50,000 Units total) by mouth once a week.    Dispense:  12 capsule    Refill:  3    90 day supply if allowed,  Will continue monthly  For a year  . cyanocobalamin (,VITAMIN B-12,) 1000 MCG/ML injection    Sig: Inject 1 mL (1,000 mcg total) into the skin every 14 (fourteen) days.    Dispense:  10 mL    Refill:  5    Please supply 12 1 cc tuberculin syringes also  . INS SYRINGE/NEEDLE 1CC/28G (B-D INSULIN SYRINGE 1CC/28G) 28G X 1/2" 1 ML MISC    Sig: For use with b12 injections    Dispense:  10 each    Refill:  3  . mirtazapine (REMERON SOL-TAB) 15 MG disintegrating tablet    Sig: Take 1 tablet (15 mg total) by mouth at bedtime.    Dispense:  90 tablet    Refill:  1   A total of 40 minutes was spent with patient more than half of which was spent in  counseling patient on the above mentioned issues , reviewing and explaining recent labs, and coordination of care.   Medications Discontinued During This Encounter  Medication Reason  .  LORazepam (ATIVAN) 1 MG tablet   . pantoprazole (PROTONIX) 40 MG tablet Reorder  . pantoprazole (PROTONIX) 40 MG tablet Reorder  . cyanocobalamin (,VITAMIN B-12,) 1000 MCG/ML injection Reorder    Follow-up: Return in about 3 months (around 07/01/2016).   Crecencio Mc, MD

## 2016-03-31 NOTE — Patient Instructions (Signed)
Start with 7.5 mg of Remeron at 10 PM   Advance after 3 weeks if needed to improve insomnia

## 2016-04-01 MED FILL — PROCHLORPERAZINE 5 MG TAB: 5 | 8 days supply | Qty: 60 | Fill #1

## 2016-04-02 ENCOUNTER — Encounter: Payer: Self-pay | Admitting: Internal Medicine

## 2016-04-02 NOTE — Assessment & Plan Note (Signed)
Liver enzymes normal. Needs hepatic function panel and cbc in 6 weeks  Now that Depakot has been resumed.  Lab Results  Component Value Date   ALT 12 03/31/2016   AST 16 03/31/2016   ALKPHOS 110 03/31/2016   BILITOT 0.3 03/31/2016    Lab Results  Component Value Date   WBC 4.6 12/29/2015   HGB 14.1 12/29/2015   HCT 41.7 12/29/2015   MCV 88.4 12/29/2015   PLT 234.0 12/29/2015

## 2016-04-02 NOTE — Assessment & Plan Note (Signed)
She has resumed once daily depakot and dd not tolerate lorazepam .  Valium 2 mg prn. Refilled.

## 2016-04-02 NOTE — Assessment & Plan Note (Signed)
Secondary to vagus nerve damage.  She is willing to try remeron for appetite stimulation

## 2016-04-02 NOTE — Assessment & Plan Note (Signed)
Recent escalation of symptoms resulting in bid dosing of protonix.  rx rewritten for bid dosing when needed.

## 2016-04-03 ENCOUNTER — Telehealth: Payer: Self-pay | Admitting: Internal Medicine

## 2016-04-03 MED FILL — diazePAM 2 MG TABS: 2 | 7 days supply | Qty: 30 | Fill #0

## 2016-04-03 NOTE — Telephone Encounter (Signed)
Pharmacy called and had a question on an rx  divalproex (DEPAKOTE) 125 MG DR tablet and wanted clarification if it's brand or generic. Please advise, thank you!  Pharmacy - [place], [place]

## 2016-04-03 NOTE — Telephone Encounter (Signed)
Script Brand name only as prescribed.

## 2016-04-28 ENCOUNTER — Other Ambulatory Visit: Payer: Self-pay | Admitting: Internal Medicine

## 2016-04-28 MED ORDER — MIRTAZAPINE 15 MG PO TABS: 15.0000 mg | ORAL_TABLET | Freq: Every day | ORAL | 3 refills | Status: DC

## 2016-04-28 MED FILL — MIRTAZAPINE 15 MG TABLET: 15 | 30 days supply | Qty: 30 | Fill #0

## 2016-05-08 MED FILL — PROCHLORPERAZINE 5 MG TAB: 5 | 8 days supply | Qty: 60 | Fill #2

## 2016-05-08 MED FILL — diazePAM 2 MG TABS: 2 | 7 days supply | Qty: 30 | Fill #1

## 2016-05-11 ENCOUNTER — Telehealth: Payer: Self-pay

## 2016-05-11 ENCOUNTER — Encounter: Payer: Self-pay | Admitting: Internal Medicine

## 2016-05-11 MED ORDER — BUTALBITAL-ASA-CAFF-CODEINE 50-325-40-30 MG PO CAPS: 2.0000 | ORAL_CAPSULE | ORAL | 5 refills | Status: DC | PRN

## 2016-05-11 NOTE — Telephone Encounter (Signed)
LMOM for patient to pick up Rx in clinic

## 2016-05-11 NOTE — Telephone Encounter (Signed)
Last refill sent in 03/17/16. Last OV 03/31/16. Has follow up on 07/26/16.

## 2016-06-13 MED FILL — PROCHLORPERAZINE 5 MG TAB: 5 | 8 days supply | Qty: 60 | Fill #3

## 2016-06-13 MED FILL — PANTOPRAZOLE SOD DR 40 MG T: 40 | 90 days supply | Qty: 90 | Fill #0

## 2016-06-13 MED FILL — LORazepam 1 MG TABS: 1 | 10 days supply | Qty: 30 | Fill #1

## 2016-07-04 ENCOUNTER — Ambulatory Visit: Payer: Medicare Other | Admitting: Internal Medicine

## 2016-07-06 MED FILL — DEPAKOTE DR 125 MG TABLET: 125 | 90 days supply | Qty: 90 | Fill #1

## 2016-07-06 MED FILL — diazePAM 2 MG TABS: 2 | 7 days supply | Qty: 30 | Fill #2

## 2016-07-26 ENCOUNTER — Ambulatory Visit: Payer: Medicare Other | Admitting: Internal Medicine

## 2016-08-30 ENCOUNTER — Ambulatory Visit: Payer: Medicare Other | Admitting: Internal Medicine

## 2016-08-30 ENCOUNTER — Telehealth: Payer: Self-pay | Admitting: Internal Medicine

## 2016-08-30 NOTE — Telephone Encounter (Signed)
fyi

## 2016-08-30 NOTE — Telephone Encounter (Signed)
DO  NOT CHARGE NO SHOW FEE

## 2016-08-30 NOTE — Telephone Encounter (Signed)
FYI - Pt spouse called and cancelled appt. Pt has been up all night throwing up.

## 2016-09-15 MED FILL — PANTOPRAZOLE SOD DR 40 MG T: 40 | 90 days supply | Qty: 90 | Fill #1

## 2016-09-15 MED FILL — diazePAM 2 MG TABS: 2 | 7 days supply | Qty: 30 | Fill #3

## 2016-09-15 MED FILL — PROCHLORPERAZINE 5 MG TAB: 5 | 8 days supply | Qty: 60 | Fill #4

## 2016-09-20 ENCOUNTER — Other Ambulatory Visit: Payer: Self-pay | Admitting: Internal Medicine

## 2016-09-20 MED ORDER — BUTALBITAL-ASA-CAFF-CODEINE 50-325-40-30 MG PO CAPS: 2.0000 | ORAL_CAPSULE | ORAL | 2 refills | Status: DC | PRN

## 2016-10-24 MED FILL — DEPAKOTE DR 125 MG TABLET: 125 | 90 days supply | Qty: 90 | Fill #2

## 2016-10-25 ENCOUNTER — Encounter: Payer: Self-pay | Admitting: Internal Medicine

## 2016-10-25 ENCOUNTER — Ambulatory Visit (INDEPENDENT_AMBULATORY_CARE_PROVIDER_SITE_OTHER): Payer: 59 | Admitting: Internal Medicine

## 2016-10-25 DIAGNOSIS — G43001 Migraine without aura, not intractable, with status migrainosus: Secondary | ICD-10-CM | POA: Diagnosis not present

## 2016-10-25 DIAGNOSIS — Z79899 Other long term (current) drug therapy: Secondary | ICD-10-CM | POA: Diagnosis not present

## 2016-10-25 DIAGNOSIS — K911 Postgastric surgery syndromes: Secondary | ICD-10-CM | POA: Diagnosis not present

## 2016-10-25 DIAGNOSIS — F39 Unspecified mood [affective] disorder: Secondary | ICD-10-CM | POA: Diagnosis not present

## 2016-10-25 MED ORDER — BUTALBITAL-ASA-CAFF-CODEINE 50-325-40-30 MG PO CAPS: 5.0000 | ORAL_CAPSULE | ORAL | 5 refills | Status: DC | PRN

## 2016-10-25 MED ORDER — GABAPENTIN 100 MG PO CAPS: 100.0000 mg | ORAL_CAPSULE | Freq: Three times a day (TID) | ORAL | 3 refills | Status: DC

## 2016-10-25 MED FILL — GABAPENTIN 100 MG CAPSULE: 100 | 30 days supply | Qty: 90 | Fill #0

## 2016-10-25 NOTE — Progress Notes (Signed)
Subjective:  Patient ID: Alexandra Lewis, female    DOB: April 26, 1956  Age: 61 y.o. MRN: [id number]  CC: Diagnoses of Episodic mood disorder ([place]), Long-term use of high-risk medication, Migraine without aura and with status migrainosus, not intractable, and Postgastric surgery syndromes were pertinent to this visit.  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   Cc: worsening chest and abdominal pain.  Patient states that the pain is longstanding for several years, but has been more prevalent lately and waking waking  her from sleep   She had a Pyloroplasty with vagotomy  in 16-Sep-2009,  And continues to opine that the surgery was ill fated and and ill conceived.  For the past few years has been having constant pain in center of her  chest and abdomen. Has a lot of surgical clips   Stays active all day long in the house.  Sleeps during the day ,  Up at  Night.  .  The pain is aching and dull, wakes up with it.  Takes protonix daily,  Zantac at night .   Denies nausea,  And any tempral relationship to eating or voiding   Not tolerating remeron anymore. Made her more manic.  Weight stable   Headaches worse because of the weather and humidity   Spent a considerable amount of time talking about her grandmother who died in 09/16/1993,  Who essentially raised her.  She also continues to express anger about the events and details of her father's death and conviinced that the COD was murder, not suicide an't let go of father who died under mysterious conditions , deemed a suicide    Outpatient Medications Prior to Visit  Medication Sig Dispense Refill  . cyanocobalamin (,VITAMIN B-12,) 1000 MCG/ML injection Inject 1 mL (1,000 mcg total) into the skin  every 14 (fourteen) days. 10 mL 5  . diazepam (VALIUM) 2 MG tablet Take 1 tablet (2 mg total) by mouth every 6 (six) hours as needed for anxiety. 30 tablet 5  . divalproex (DEPAKOTE) 125 MG DR tablet Take 1 tablet (125 mg total) by mouth daily. DO NOT SUBSTITUTE 90 tablet 2  . ergocalciferol (DRISDOL) 50000 units capsule Take 1 capsule (50,000 Units total) by mouth once a week. 12 capsule 3  . INS SYRINGE/NEEDLE 1CC/28G (B-D INSULIN SYRINGE 1CC/28G) 28G X 1/2" 1 ML MISC For use with b12 injections 10 each 3  . pantoprazole (PROTONIX) 40 MG tablet Take 1 tablet (40 mg total) by mouth 2 (two) times daily before a meal. Breakfast and supper (Patient taking differently: Take 40 mg by mouth daily. Breakfast and supper) 180 tablet 3  . prochlorperazine (COMPAZINE) 5 MG tablet Taking 1-2 tablets every 6 hours as needed for nausea and vomiting. 60 tablet 5  . ZOLMitriptan (ZOMIG) 2.5 MG tablet 1/2 to 1 tablet as needed for migraine headaches 8 tablet 5  . butalbital-aspirin-caffeine-codeine (FIORINAL WITH CODEINE) 50-325-40-30 MG capsule Take 2 capsules by mouth every 4 (four) hours as needed for pain. 180 capsule 2  . divalproex (DEPAKOTE) 125 MG DR tablet Take 1 tablet (125 mg total) by mouth 2 (two) times daily. (Patient not taking: Reported on 10/25/2016) 60 tablet 5  . mirtazapine (REMERON) 15 MG tablet Take 1 tablet (15 mg total) by mouth  at bedtime. (Patient not taking: Reported on 10/25/2016) 30 tablet 3  . Vitamin D, Ergocalciferol, (DRISDOL) 50000 units CAPS capsule Taking 1 capsule twice a week (Patient not taking: Reported on 10/25/2016) 24 capsule 5   No facility-administered medications prior to visit.     Review of Systems;  Patient denies headache, fevers, malaise, unintentional weight loss, skin rash, eye pain, sinus congestion and sinus pain, sore throat, dysphagia,  hemoptysis , cough, dyspnea, wheezing, chest pain, palpitations, orthopnea, edema, abdominal pain, nausea, melena, diarrhea,  constipation, flank pain, dysuria, hematuria, urinary  Frequency, nocturia, numbness, tingling, seizures,  Focal weakness, Loss of consciousness,  Tremor, insomnia, depression, anxiety, and suicidal ideation.      Objective:  BP 110/70 (BP Location: Left Arm, Patient Position: Sitting, Cuff Size: Normal)   Pulse 62   Temp 98 F (36.7 C) (Oral)   Resp 16   Ht 5\' 4"  (1.626 m)   Wt 94 lb 6.4 oz (42.8 kg)   SpO2 99%   BMI 16.20 kg/m   BP Readings from Last 3 Encounters:  10/25/16 110/70  03/31/16 124/70  12/29/15 108/62    Wt Readings from Last 3 Encounters:  10/25/16 94 lb 6.4 oz (42.8 kg)  03/31/16 93 lb 3.2 oz (42.3 kg)  12/29/15 91 lb (41.3 kg)    General appearance: alert, cooperative and appears stated age Ears: normal TM's and external ear canals both ears Throat: lips, mucosa, and tongue normal; teeth and gums normal Neck: no adenopathy, no carotid bruit, supple, symmetrical, trachea midline and thyroid not enlarged, symmetric, no tenderness/mass/nodules Back: symmetric, no curvature. ROM normal. No CVA tenderness. Lungs: clear to auscultation bilaterally Heart: regular rate and rhythm, S1, S2 normal, no murmur, click, rub or gallop Abdomen: soft, non-tender; bowel sounds normal; no masses,  no organomegaly Pulses: 2+ and symmetric Skin: Skin color, texture, turgor normal. No rashes or lesions Lymph nodes: Cervical, supraclavicular, and axillary nodes normal.  No results found for: HGBA1C  Lab Results  Component Value Date   CREATININE 0.94 10/25/2016   CREATININE 0.77 03/31/2016   CREATININE 0.96 12/29/2015    Lab Results  Component Value Date   WBC 5.2 10/25/2016   HGB 13.3 10/25/2016   HCT 39.6 10/25/2016   PLT 235.0 10/25/2016   GLUCOSE 95 10/25/2016   CHOL 252 (H) 12/29/2015   TRIG 79.0 12/29/2015   HDL 104.30 12/29/2015   LDLDIRECT 112.0 12/29/2015   LDLCALC 132 (H) 12/29/2015   ALT 12 10/25/2016   AST 18 10/25/2016   NA 136 10/25/2016   K 4.7  10/25/2016   CL 101 10/25/2016   CREATININE 0.94 10/25/2016   BUN 13 10/25/2016   CO2 30 10/25/2016   TSH 1.54 12/29/2015    Mr Brain W Wo Contrast  Result Date: 05/18/2015 CLINICAL DATA:  61 year old female with posterior headaches with visual aura. Symptoms for 6 months. Initial encounter. 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 EXAM: MRI HEAD WITHOUT AND WITH CONTRAST TECHNIQUE: Multiplanar, multiecho pulse sequences of the brain and surrounding structures were obtained without and with intravenous contrast. CONTRAST:  59mL MULTIHANCE GADOBENATE DIMEGLUMINE 529 MG/ML IV SOLN COMPARISON:  Brain MRI 12/08/2011. FINDINGS: No restricted diffusion to suggest acute infarction. No midline shift, mass effect, evidence of mass lesion, ventriculomegaly, extra-axial collection or acute intracranial hemorrhage. Cervicomedullary junction and pituitary are within normal limits. Negative visualized cervical spine. Major intracranial vascular flow voids are stable. Cerebral volume is stable since 2013. Expected evolution of the small posterior left corona  radiata lacunar infarct. No associated hemosiderin. No cortical encephalomalacia, and elsewhere Stable and normal for age gray and white matter signal. No abnormal enhancement identified. No dural thickening identified. Visible internal auditory structures appear normal. Mastoids are clear. Mild paranasal sinus mucosal thickening is stable. Orbit and scalp soft tissues are within normal limits. IMPRESSION: 1.  No acute intracranial abnormality. 2. Negative for age MRI appearance of the brain aside from expected chronic appearance of the 2013 posterior left corona radiata lacunar infarct. Electronically Signed   By: Genevie Ann M.D.   On: 05/18/2015 16:19    Assessment & Plan:   Problem List Items Addressed This Visit    POSTGASTRIC SURGERY SYNDROMES    Trial of gabapentin 100 mg at bedtime       Migraine headache    Managed with fiorinal with  codeine .  She is cautioned to limit her use to 6 daily .  Liver enzymes are normal today.  Lab Results  Component Value Date   ALT 12 10/25/2016   AST 18 10/25/2016   ALKPHOS 114 10/25/2016   BILITOT 0.2 10/25/2016         Relevant Medications   gabapentin (NEURONTIN) 100 MG capsule   butalbital-aspirin-caffeine-codeine (FIORINAL WITH CODEINE) 50-325-40-30 MG capsule   Long-term use of high-risk medication   Episodic mood disorder ([place])    Diagnosed with bipolar disorder in her late 20's  Several family members also with bipolar disorder.   remeron caused an increase in manic symptoms.           A total of 40 minutes was spent with patient more than half of which was spent in counseling patient on the above mentioned issues , reviewing and explaining recent labs and imaging studies done, and coordination of care.  Refill history confirmed via [place] Controlled Substance databas, accessed by me today..   I have discontinued Ms. Merriott Vitamin D (Ergocalciferol) and mirtazapine. I have also changed her butalbital-aspirin-caffeine-codeine. Additionally, I am having her start on gabapentin. Lastly, I am having her maintain her ZOLMitriptan, prochlorperazine, divalproex, diazepam, pantoprazole, ergocalciferol, cyanocobalamin, and INS SYRINGE/NEEDLE 1CC/28G.  Meds ordered this encounter  Medications  . gabapentin (NEURONTIN) 100 MG capsule    Sig: Take 1 capsule (100 mg total) by mouth 3 (three) times daily.    Dispense:  90 capsule    Refill:  3  . butalbital-aspirin-caffeine-codeine (FIORINAL WITH CODEINE) 50-325-40-30 MG capsule    Sig: Take 5 capsules by mouth every 4 (four) hours as needed for pain.    Dispense:  180 capsule    Refill:  5    Medications Discontinued During This Encounter  Medication Reason  . divalproex (DEPAKOTE) 125 MG DR tablet Patient has not taken in last 30 days  . mirtazapine (REMERON) 15 MG tablet Patient has not taken in last 30 days  . Vitamin  D, Ergocalciferol, (DRISDOL) 50000 units CAPS capsule Patient has not taken in last 30 days  . butalbital-aspirin-caffeine-codeine (FIORINAL WITH CODEINE) 50-325-40-30 MG capsule Reorder    Follow-up: No Follow-up on file.   Crecencio Mc, MD

## 2016-10-25 NOTE — Patient Instructions (Signed)
Good to see you!  I agree with getting Alexandra Lewis a companion!    Trial of gabapentin 100 mg  Up to three times daily to see if it helps your abdominal pain

## 2016-10-26 ENCOUNTER — Encounter: Payer: Self-pay | Admitting: Internal Medicine

## 2016-10-26 LAB — COMPREHENSIVE METABOLIC PANEL
ALT: 12 U/L (ref 0–35)
AST: 18 U/L (ref 0–37)
Albumin: 4.3 g/dL (ref 3.5–5.2)
Alkaline Phosphatase: 114 U/L (ref 39–117)
BILIRUBIN TOTAL: 0.2 mg/dL (ref 0.2–1.2)
BUN: 13 mg/dL (ref 6–23)
CHLORIDE: 101 meq/L (ref 96–112)
CO2: 30 meq/L (ref 19–32)
Calcium: 9.4 mg/dL (ref 8.4–10.5)
Creatinine, Ser: 0.94 mg/dL (ref 0.40–1.20)
GFR: 64.54 mL/min (ref >60.00)
GLUCOSE: 95 mg/dL (ref 70–99)
POTASSIUM: 4.7 meq/L (ref 3.5–5.1)
Sodium: 136 mEq/L (ref 135–145)
Total Protein: 6.4 g/dL (ref 6.0–8.3)

## 2016-10-26 LAB — CBC WITH DIFFERENTIAL/PLATELET
BASOS ABS: 0.1 10*3/uL (ref 0.0–0.1)
BASOS PCT: 1.3 % (ref 0.0–3.0)
EOS PCT: 2.9 % (ref 0.0–5.0)
Eosinophils Absolute: 0.1 10*3/uL (ref 0.0–0.7)
HEMATOCRIT: 39.6 % (ref 36.0–46.0)
Hemoglobin: 13.3 g/dL (ref 12.0–15.0)
LYMPHS ABS: 1.7 10*3/uL (ref 0.7–4.0)
Lymphocytes Relative: 32 % (ref 12.0–46.0)
MCHC: 33.5 g/dL (ref 30.0–36.0)
MCV: 88.1 fl (ref 78.0–100.0)
MONOS PCT: 11.7 % (ref 3.0–12.0)
Monocytes Absolute: 0.6 10*3/uL (ref 0.1–1.0)
NEUTROS ABS: 2.7 10*3/uL (ref 1.4–7.7)
Neutrophils Relative %: 52.1 % (ref 43.0–77.0)
PLATELETS: 235 10*3/uL (ref 150.0–400.0)
RBC: 4.5 Mil/uL (ref 3.87–5.11)
RDW: 13.3 % (ref 11.5–15.5)
WBC: 5.2 10*3/uL (ref 4.0–10.5)

## 2016-10-26 NOTE — Assessment & Plan Note (Signed)
Managed with fiorinal with codeine .  She is cautioned to limit her use to 6 daily .  Liver enzymes are normal today.  Lab Results  Component Value Date   ALT 12 10/25/2016   AST 18 10/25/2016   ALKPHOS 114 10/25/2016   BILITOT 0.2 10/25/2016

## 2016-10-26 NOTE — Assessment & Plan Note (Signed)
Diagnosed with bipolar disorder in her late 20's  Several family members also with bipolar disorder.   remeron caused an increase in manic symptoms.

## 2016-10-26 NOTE — Assessment & Plan Note (Signed)
Trial of gabapentin 100mg at bedtime 

## 2016-11-30 MED FILL — PROCHLORPERAZINE 5 MG TAB: 5 | 8 days supply | Qty: 60 | Fill #5

## 2016-12-15 MED FILL — PANTOPRAZOLE SOD DR 40 MG T: 40 | 90 days supply | Qty: 90 | Fill #2

## 2017-01-02 MED FILL — VIT D2 1.25 MG (50,000 UNIT: 1.25 MG | 84 days supply | Qty: 12 | Fill #1

## 2017-01-03 ENCOUNTER — Encounter: Payer: Self-pay | Admitting: Internal Medicine

## 2017-01-04 MED ORDER — ZOLMITRIPTAN 2.5 MG PO TABS: ORAL_TABLET | ORAL | 5 refills | Status: DC

## 2017-01-04 MED ORDER — PROCHLORPERAZINE MALEATE 5 MG PO TABS: ORAL_TABLET | ORAL | 5 refills | Status: DC

## 2017-01-04 MED ORDER — DIAZEPAM 2 MG PO TABS: 2.0000 mg | ORAL_TABLET | Freq: Four times a day (QID) | ORAL | 5 refills | Status: DC | PRN

## 2017-01-04 MED FILL — PROCHLORPERAZINE 5 MG TAB: 5 | 7 days supply | Qty: 60 | Fill #0

## 2017-01-04 MED FILL — diazePAM 2 MG TABS: 2 | 7 days supply | Qty: 30 | Fill #0

## 2017-01-05 MED FILL — ZOLMitriptan 2.5 MG TABS: 2.5 | 30 days supply | Qty: 8 | Fill #0

## 2017-01-05 MED FILL — MIRTAZAPINE 15 MG TABLET: 15 | 30 days supply | Qty: 30 | Fill #1

## 2017-01-15 ENCOUNTER — Telehealth: Payer: Self-pay

## 2017-01-15 NOTE — Telephone Encounter (Signed)
Attempted to call patient to clarify insurance info for prior authorization. Left message to return call to office.

## 2017-01-18 ENCOUNTER — Encounter: Payer: Self-pay | Admitting: Internal Medicine

## 2017-01-18 MED ORDER — DIVALPROEX SODIUM 125 MG PO DR TAB: 125.0000 mg | DELAYED_RELEASE_TABLET | Freq: Every day | ORAL | 2 refills | Status: DC

## 2017-01-18 MED FILL — DEPAKOTE DR 125 MG TABLET: 125 | 90 days supply | Qty: 90 | Fill #0

## 2017-01-24 NOTE — Telephone Encounter (Signed)
Spoke with [employer]. Awaiting approval or denial of PA

## 2017-01-24 NOTE — Telephone Encounter (Signed)
[employer] called and had a couple of questions in regards to this medication. Please advise, thank you!  Call [phone]  Ref # [id number]

## 2017-01-26 MED FILL — ZOLMitriptan 2.5 MG TABS: 2.5 | 30 days supply | Qty: 8 | Fill #1

## 2017-03-12 ENCOUNTER — Other Ambulatory Visit: Payer: Self-pay | Admitting: Internal Medicine

## 2017-03-12 DIAGNOSIS — T39095S Adverse effect of salicylates, sequela: Secondary | ICD-10-CM

## 2017-03-12 MED FILL — PANTOPRAZOLE SOD DR 40 MG T: 40 | 90 days supply | Qty: 90 | Fill #0

## 2017-03-18 MED FILL — PROCHLORPERAZINE 5 MG TAB: 5 | 7 days supply | Qty: 60 | Fill #1

## 2017-03-23 ENCOUNTER — Other Ambulatory Visit: Payer: Self-pay

## 2017-03-23 MED ORDER — BUTALBITAL-ASA-CAFF-CODEINE 50-325-40-30 MG PO CAPS: 5.0000 | ORAL_CAPSULE | ORAL | 5 refills | Status: DC | PRN

## 2017-03-23 NOTE — Telephone Encounter (Signed)
refilled 

## 2017-03-23 NOTE — Telephone Encounter (Signed)
Refilled: 10/25/2016 Last OV: 10/25/2016 Next OV: 03/27/2017

## 2017-03-27 ENCOUNTER — Telehealth: Payer: Self-pay | Admitting: Internal Medicine

## 2017-03-27 ENCOUNTER — Ambulatory Visit: Payer: Medicare Other | Admitting: Internal Medicine

## 2017-03-27 MED FILL — BUTALBITAL COMP-CODEINE #3: 50-325-40-3 | 15 days supply | Qty: 180 | Fill #0 | Status: TO

## 2017-03-27 NOTE — Telephone Encounter (Signed)
Copied from [facility] #[id number]. Topic: Quick Communication - Appointment Cancellation >> Mar 27, 2017  2:07 PM Burnis Medin, NT wrote: Patient called to cancel appointment scheduled for today. Pt. Is too sick to come in.  Route to department's PEC pool.

## 2017-03-28 MED FILL — VIT D2 1.25 MG (50,000 UNIT: 1.25 MG | 84 days supply | Qty: 12 | Fill #2

## 2017-03-28 MED FILL — diazePAM 2 MG TABS: 2 | 7 days supply | Qty: 30 | Fill #1

## 2017-04-17 MED FILL — DEPAKOTE DR 125 MG TABLET: 125 | 90 days supply | Qty: 90 | Fill #1

## 2017-06-12 MED FILL — PROCHLORPERAZINE 5 MG TAB: 5 | 7 days supply | Qty: 60 | Fill #2

## 2017-06-12 MED FILL — PANTOPRAZOLE SOD DR 40 MG T: 40 | 90 days supply | Qty: 90 | Fill #1

## 2017-06-19 ENCOUNTER — Encounter: Payer: Self-pay | Admitting: Internal Medicine

## 2017-06-19 ENCOUNTER — Ambulatory Visit (INDEPENDENT_AMBULATORY_CARE_PROVIDER_SITE_OTHER): Payer: 59 | Admitting: Internal Medicine

## 2017-06-19 VITALS — BP 108/64 | HR 68 | Temp 98.0°F | Resp 15 | Ht 64.0 in | Wt 89.8 lb

## 2017-06-19 DIAGNOSIS — K911 Postgastric surgery syndromes: Secondary | ICD-10-CM

## 2017-06-19 DIAGNOSIS — E559 Vitamin D deficiency, unspecified: Secondary | ICD-10-CM | POA: Diagnosis not present

## 2017-06-19 DIAGNOSIS — Z23 Encounter for immunization: Secondary | ICD-10-CM | POA: Diagnosis not present

## 2017-06-19 DIAGNOSIS — G43001 Migraine without aura, not intractable, with status migrainosus: Secondary | ICD-10-CM

## 2017-06-19 DIAGNOSIS — M818 Other osteoporosis without current pathological fracture: Secondary | ICD-10-CM

## 2017-06-19 DIAGNOSIS — F39 Unspecified mood [affective] disorder: Secondary | ICD-10-CM

## 2017-06-19 DIAGNOSIS — Z1231 Encounter for screening mammogram for malignant neoplasm of breast: Secondary | ICD-10-CM

## 2017-06-19 DIAGNOSIS — Z1239 Encounter for other screening for malignant neoplasm of breast: Secondary | ICD-10-CM

## 2017-06-19 DIAGNOSIS — Z79899 Other long term (current) drug therapy: Secondary | ICD-10-CM

## 2017-06-19 MED ORDER — DIAZEPAM 2 MG PO TABS: 2.0000 mg | ORAL_TABLET | Freq: Four times a day (QID) | ORAL | 5 refills | Status: DC | PRN

## 2017-06-19 MED ORDER — BUTALBITAL-ASA-CAFF-CODEINE 50-325-40-30 MG PO CAPS: 2.0000 | ORAL_CAPSULE | ORAL | 5 refills | Status: DC | PRN

## 2017-06-19 MED ORDER — ERGOCALCIFEROL 1.25 MG (50000 UT) PO CAPS: 50000.0000 [IU] | ORAL_CAPSULE | ORAL | 3 refills | Status: DC

## 2017-06-19 MED ORDER — RALOXIFENE HCL 60 MG PO TABS: 60.0000 mg | ORAL_TABLET | Freq: Every day | ORAL | 11 refills | Status: DC

## 2017-06-19 NOTE — Assessment & Plan Note (Addendum)
Occurring 2-3 times per week weather related/triggered. sometims waking her up at nigh  USING  Fiorinal and zomig . Refills given

## 2017-06-19 NOTE — Progress Notes (Signed)
Subjective:  Patient ID: Alexandra Lewis, female    DOB: 10-25-1955  Age: 62 y.o. MRN: [id number]  CC: The primary encounter diagnosis was Vitamin D deficiency. Diagnoses of Long-term use of high-risk medication, Other osteoporosis without current pathological fracture, Migraine without aura and with status migrainosus, not intractable, Breast cancer screening, Need for immunization against influenza, Postgastric surgery syndromes, and Episodic mood disorder ([place]) were also pertinent to this visit.  HPI Alexandra Lewis presents for follow up on chronic migraine headaches and other chronic issues.   Still underweight  nauseated by smell of food .  Weight stable   Osteoporosis   Does not want prolia due to risk of OCN.  Took forteo, fosomax, (c/i gastritis) . No prior trial of evista   Needs dEXA and mammogram NOT ON  A Monday .  AFTERNOON NEEDED  Outpatient Medications Prior to Visit  Medication Sig Dispense Refill  . cyanocobalamin (,VITAMIN B-12,) 1000 MCG/ML injection Inject 1 mL (1,000 mcg total) into the skin every 14 (fourteen) days. 10 mL 5  . divalproex (DEPAKOTE) 125 MG DR tablet Take 1 tablet (125 mg total) by mouth daily. DO NOT SUBSTITUTE 90 tablet 2  . INS SYRINGE/NEEDLE 1CC/28G (B-D INSULIN SYRINGE 1CC/28G) 28G X 1/2" 1 ML MISC For use with b12 injections 10 each 3  . pantoprazole (PROTONIX) 40 MG tablet TAKE 1 TABLET BY MOUTH ONCE DAILY 90 tablet 3  . prochlorperazine (COMPAZINE) 5 MG tablet Taking 1-2 tablets every 6 hours as needed for nausea and vomiting. 60 tablet 5  . ZOLMitriptan (ZOMIG) 2.5 MG tablet 1/2 to 1 tablet as needed for migraine headaches 8 tablet 5  . butalbital-aspirin-caffeine-codeine (FIORINAL WITH CODEINE) 50-325-40-30 MG capsule Take 5 capsules by mouth every 4 (four) hours as needed for pain. 180 capsule 5  . diazepam (VALIUM) 2 MG tablet Take 1 tablet (2 mg total) by mouth every 6 (six) hours as needed for anxiety. 30 tablet 5  . ergocalciferol  (DRISDOL) 50000 units capsule Take 1 capsule (50,000 Units total) by mouth once a week. 12 capsule 3  . gabapentin (NEURONTIN) 100 MG capsule Take 1 capsule (100 mg total) by mouth 3 (three) times daily. 90 capsule 3   No facility-administered medications prior to visit.     Review of Systems;  Patient denies headache, fevers, malaise, unintentional weight loss, skin rash, eye pain, sinus congestion and sinus pain, sore throat, dysphagia,  hemoptysis , cough, dyspnea, wheezing, chest pain, palpitations, orthopnea, edema, abdominal pain, nausea, melena, diarrhea, constipation, flank pain, dysuria, hematuria, urinary  Frequency, nocturia, numbness, tingling, seizures,  Focal weakness, Loss of consciousness,  Tremor, insomnia, depression, anxiety, and suicidal ideation.      Objective:  BP 108/64 (BP Location: Left Arm, Patient Position: Sitting, Cuff Size: Normal)   Pulse 68   Temp 98 F (36.7 C) (Oral)   Resp 15   Ht 5\' 4"  (1.626 m)   Wt 89 lb 12.8 oz (40.7 kg)   SpO2 98%   BMI 15.41 kg/m   BP Readings from Last 3 Encounters:  06/19/17 108/64  10/25/16 110/70  03/31/16 124/70    Wt Readings from Last 3 Encounters:  06/19/17 89 lb 12.8 oz (40.7 kg)  10/25/16 94 lb 6.4 oz (42.8 kg)  03/31/16 93 lb 3.2 oz (42.3 kg)    General appearance: alert, cooperative and appears stated age Ears: normal TM's and external ear canals both ears Throat: lips, mucosa, and tongue normal; teeth and gums normal Neck: no  adenopathy, no carotid bruit, supple, symmetrical, trachea midline and thyroid not enlarged, symmetric, no tenderness/mass/nodules Back: symmetric, no curvature. ROM normal. No CVA tenderness. Lungs: clear to auscultation bilaterally Heart: regular rate and rhythm, S1, S2 normal, no murmur, click, rub or gallop Abdomen: soft, non-tender; bowel sounds normal; no masses,  no organomegaly Pulses: 2+ and symmetric Skin: Skin color, texture, turgor normal. No rashes or lesions Lymph  nodes: Cervical, supraclavicular, and axillary nodes normal.  No results found for: HGBA1C  Lab Results  Component Value Date   CREATININE 0.82 06/19/2017   CREATININE 0.94 10/25/2016   CREATININE 0.77 03/31/2016    Lab Results  Component Value Date   WBC 5.2 10/25/2016   HGB 13.3 10/25/2016   HCT 39.6 10/25/2016   PLT 235.0 10/25/2016   GLUCOSE 90 06/19/2017   CHOL 252 (H) 12/29/2015   TRIG 79.0 12/29/2015   HDL 104.30 12/29/2015   LDLDIRECT 112.0 12/29/2015   LDLCALC 132 (H) 12/29/2015   ALT 15 06/19/2017   AST 14 06/19/2017   NA 137 06/19/2017   K 3.7 06/19/2017   CL 100 06/19/2017   CREATININE 0.82 06/19/2017   BUN 19 06/19/2017   CO2 27 06/19/2017   TSH 1.54 12/29/2015    Mr Brain W Wo Contrast  Result Date: 05/18/2015 CLINICAL DATA:  62 year old female with posterior headaches with visual aura. Symptoms for 6 months. Initial encounter. 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 EXAM: MRI HEAD WITHOUT AND WITH CONTRAST TECHNIQUE: Multiplanar, multiecho pulse sequences of the brain and surrounding structures were obtained without and with intravenous contrast. CONTRAST:  60mL MULTIHANCE GADOBENATE DIMEGLUMINE 529 MG/ML IV SOLN COMPARISON:  Brain MRI 12/08/2011. FINDINGS: No restricted diffusion to suggest acute infarction. No midline shift, mass effect, evidence of mass lesion, ventriculomegaly, extra-axial collection or acute intracranial hemorrhage. Cervicomedullary junction and pituitary are within normal limits. Negative visualized cervical spine. Major intracranial vascular flow voids are stable. Cerebral volume is stable since 2013. Expected evolution of the small posterior left corona radiata lacunar infarct. No associated hemosiderin. No cortical encephalomalacia, and elsewhere Stable and normal for age gray and white matter signal. No abnormal enhancement identified. No dural thickening identified. Visible internal auditory structures appear normal.  Mastoids are clear. Mild paranasal sinus mucosal thickening is stable. Orbit and scalp soft tissues are within normal limits. IMPRESSION: 1.  No acute intracranial abnormality. 2. Negative for age MRI appearance of the brain aside from expected chronic appearance of the 2013 posterior left corona radiata lacunar infarct. Electronically Signed   By: Genevie Ann M.D.   On: 05/18/2015 16:19    Assessment & Plan:   Problem List Items Addressed This Visit    Long-term use of high-risk medication   Relevant Orders   Comprehensive metabolic panel (Completed)   Episodic mood disorder ([place])    Diagnosed with bipolar disorder in her late 20's  Several family members also with bipolar disorder.   remeron caused an increase in manic symptoms.  mood is currently stable.       Migraine headache    Occurring 2-3 times per week weather related/triggered. sometims waking her up at nigh  USING  Fiorinal and zomig . Refills given       Relevant Medications   butalbital-aspirin-caffeine-codeine (FIORINAL WITH CODEINE) 50-325-40-30 MG capsule   Osteoporosis    Discussed available options  She is willing to try Evista .  Prior  use of forteoand alendronate not tolerated       Relevant Medications  ergocalciferol (DRISDOL) 50000 units capsule   raloxifene (EVISTA) 60 MG tablet   Other Relevant Orders   DG Bone Density   POSTGASTRIC SURGERY SYNDROMES    Trial of gabapentin 100 mg at bedtime was not tolerated.        Other Visit Diagnoses    Vitamin D deficiency    -  Primary   Relevant Orders   VITAMIN D 25 Hydroxy (Vit-D Deficiency, Fractures) (Completed)   Breast cancer screening       Relevant Orders   MM SCREENING BREAST TOMO BILATERAL   Need for immunization against influenza       Relevant Orders   Flu Vaccine QUAD 36+ mos IM (Completed)     A total of 25 minutes of face to face time was spent with patient more than half of which was spent in counselling about the above mentioned conditions   and coordination of care  I have discontinued Alexandra Lewis "Chris"'s gabapentin. I have also changed her butalbital-aspirin-caffeine-codeine. Additionally, I am having her start on raloxifene. Lastly, I am having her maintain her cyanocobalamin, INS SYRINGE/NEEDLE 1CC/28G, ZOLMitriptan, prochlorperazine, divalproex, pantoprazole, diazepam, and ergocalciferol.  Meds ordered this encounter  Medications  . butalbital-aspirin-caffeine-codeine (FIORINAL WITH CODEINE) 50-325-40-30 MG capsule    Sig: Take 2 capsules by mouth every 4 (four) hours as needed for pain.    Dispense:  180 capsule    Refill:  5  . diazepam (VALIUM) 2 MG tablet    Sig: Take 1 tablet (2 mg total) by mouth every 6 (six) hours as needed for anxiety.    Dispense:  30 tablet    Refill:  5  . ergocalciferol (DRISDOL) 50000 units capsule    Sig: Take 1 capsule (50,000 Units total) by mouth once a week.    Dispense:  12 capsule    Refill:  3    90 day supply if allowed,  Will continue monthly  For a year  . raloxifene (EVISTA) 60 MG tablet    Sig: Take 1 tablet (60 mg total) by mouth daily.    Dispense:  30 tablet    Refill:  11    Medications Discontinued During This Encounter  Medication Reason  . butalbital-aspirin-caffeine-codeine (FIORINAL WITH CODEINE) 50-325-40-30 MG capsule Reorder  . diazepam (VALIUM) 2 MG tablet Reorder  . ergocalciferol (DRISDOL) 50000 units capsule Reorder  . gabapentin (NEURONTIN) 100 MG capsule     Follow-up: Return in about 6 months (around 12/17/2017) for FOLLOW UP HEADACHES,  OSTEOPOROSISS.   Crecencio Mc, MD

## 2017-06-19 NOTE — Patient Instructions (Signed)
We discussed a trial of of Evista  For treatment of osteoporosis and the added benefit of breast cancer prevention  We should delay starting it until after your mammogram and DEXA scan have been done

## 2017-06-19 NOTE — Assessment & Plan Note (Addendum)
Discussed available options  She is willing to try Evista .  Prior  use of forteoand alendronate not tolerated

## 2017-06-20 LAB — COMPREHENSIVE METABOLIC PANEL
ALK PHOS: 108 U/L (ref 39–117)
ALT: 15 U/L (ref 0–35)
AST: 14 U/L (ref 0–37)
Albumin: 4.3 g/dL (ref 3.5–5.2)
BILIRUBIN TOTAL: 0.3 mg/dL (ref 0.2–1.2)
BUN: 19 mg/dL (ref 6–23)
CALCIUM: 8.8 mg/dL (ref 8.4–10.5)
CO2: 27 meq/L (ref 19–32)
CREATININE: 0.82 mg/dL (ref 0.40–1.20)
Chloride: 100 mEq/L (ref 96–112)
GFR: 75.39 mL/min (ref >60.00)
GLUCOSE: 90 mg/dL (ref 70–99)
Potassium: 3.7 mEq/L (ref 3.5–5.1)
Sodium: 137 mEq/L (ref 135–145)
TOTAL PROTEIN: 6.5 g/dL (ref 6.0–8.3)

## 2017-06-20 LAB — VITAMIN D 25 HYDROXY (VIT D DEFICIENCY, FRACTURES): VITD: 61.62 ng/mL (ref 30.00–100.00)

## 2017-06-20 MED FILL — diazePAM 2 MG TABS: 2 | 7 days supply | Qty: 30 | Fill #2

## 2017-06-20 NOTE — Assessment & Plan Note (Signed)
Diagnosed with bipolar disorder in her late 20's  Several family members also with bipolar disorder.   remeron caused an increase in manic symptoms.  mood is currently stable.

## 2017-06-20 NOTE — Assessment & Plan Note (Signed)
Trial of gabapentin 100 mg at bedtime was not tolerated.

## 2017-06-21 ENCOUNTER — Encounter: Payer: Self-pay | Admitting: Internal Medicine

## 2017-07-18 MED FILL — DEPAKOTE DR 125 MG TABLET: 125 | 90 days supply | Qty: 90 | Fill #2

## 2017-07-20 MED FILL — VIT D2 1.25 MG (50,000 UNIT: 1.25 MG | 84 days supply | Qty: 12 | Fill #0

## 2017-07-20 MED FILL — diazePAM 2 MG TABS: 2 | 8 days supply | Qty: 30 | Fill #0

## 2017-09-09 MED FILL — PROCHLORPERAZINE 5 MG TAB: 5 | 7 days supply | Qty: 60 | Fill #3

## 2017-09-09 MED FILL — PANTOPRAZOLE SOD DR 40 MG T: 40 | 90 days supply | Qty: 90 | Fill #2

## 2017-10-05 MED FILL — VIT D2 1.25 MG (50,000 UNIT: 1.25 MG | 84 days supply | Qty: 12 | Fill #1

## 2017-10-09 ENCOUNTER — Other Ambulatory Visit: Payer: Self-pay | Admitting: Internal Medicine

## 2017-10-09 NOTE — Telephone Encounter (Signed)
Refilled: 01/18/2017 Last OV: 06/19/2017 Next OV: 11/14/2017

## 2017-10-10 MED FILL — DEPAKOTE DR 125 MG TABLET: 125 | 90 days supply | Qty: 90 | Fill #0

## 2017-11-03 ENCOUNTER — Encounter (HOSPITAL_COMMUNITY): Payer: Self-pay | Admitting: Dentistry

## 2017-11-03 ENCOUNTER — Ambulatory Visit (HOSPITAL_COMMUNITY): Payer: Medicare Other | Admitting: Dentistry

## 2017-11-03 VITALS — BP 138/79 | HR 94 | Temp 98.2°F

## 2017-11-03 NOTE — Patient Instructions (Signed)

## 2017-11-03 NOTE — Progress Notes (Signed)
11/03/2017  Patient:            Alexandra Lewis Date of Birth:  March 06, 1956 MRN:                [id number]  BP 138/79   Pulse 94   Temp 98.2 F (36.8 C)    Alexandra Lewis is a 62year old female that presents for periodic oral exam, dental radiograph, and evaluation of upper right molar toothache and upper left molar amalgam restoration fracture.  Patient indicates that she has intermittent toothache symptoms that reached an intensity of 8 out of 10 and is sharp and spontaneous in nature.  Upper left molar does not bother her since she does not eat in the area but is sensitive when she eats in the area.  The patient was last seen for periodic oral examination, periodontal care, and dental restorations on 05/09/2013.   Medical Hx Update:  Past Medical History:  Diagnosis Date  . Adverse effect of salicylate- ulcer 01/22/2354  . Chronic back pain   . Chronic Migraine Headaches   . CVA (cerebrovascular accident) ([place]) 12/05/11   Left CVA(corona radiata); Right sided weakness  . Depression   . GERD (gastroesophageal reflux disease)   . History of peptic ulcer disease   . Loss of weight   . 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 Osteoporosis    T6 compression fracture  . Partial gastric outlet obstruction 07/04/2006  . Postgastric surgery syndromes   . Pyloric stenosis 11/25/2008   S/P Pyloroplasty- 06/29/09 - Dr. Hassell Lewis  .  ALLERGIES/ADVERSE DRUG REACTIONS: Allergies  Allergen Reactions  . Promethazine Hcl     Dystonic reaction  . Celecoxib Rash  . Tape Rash    MEDICATIONS: Current Outpatient Medications  Medication Sig Dispense Refill  . butalbital-aspirin-caffeine-codeine (FIORINAL WITH CODEINE) 50-325-40-30 MG capsule Take 2 capsules by mouth every 4 (four) hours as needed for pain. 180 capsule 5  . DEPAKOTE 125 MG DR tablet TAKE 1 TABLET BY MOUTH DAILY 90 tablet 2  . diazepam (VALIUM) 2 MG tablet Take 1 tablet (2 mg total) by mouth every 6 (six) hours as  needed for anxiety. 30 tablet 5  . ergocalciferol (DRISDOL) 50000 units capsule Take 1 capsule (50,000 Units total) by mouth once a week. 12 capsule 3  . pantoprazole (PROTONIX) 40 MG tablet TAKE 1 TABLET BY MOUTH ONCE DAILY 90 tablet 3  . prochlorperazine (COMPAZINE) 5 MG tablet Taking 1-2 tablets every 6 hours as needed for nausea and vomiting. 60 tablet 5  . cyanocobalamin (,VITAMIN B-12,) 1000 MCG/ML injection Inject 1 mL (1,000 mcg total) into the skin every 14 (fourteen) days. (Patient not taking: Reported on 11/03/2017) 10 mL 5  . INS SYRINGE/NEEDLE 1CC/28G (B-D INSULIN SYRINGE 1CC/28G) 28G X 1/2" 1 ML MISC For use with b12 injections (Patient not taking: Reported on 11/03/2017) 10 each 3  . raloxifene (EVISTA) 60 MG tablet Take 1 tablet (60 mg total) by mouth daily. (Patient not taking: Reported on 11/03/2017) 30 tablet 11  . ZOLMitriptan (ZOMIG) 2.5 MG tablet 1/2 to 1 tablet as needed for migraine headaches (Patient not taking: Reported on 11/03/2017) 8 tablet 5   No current facility-administered medications for this visit.     DENTAL EXAM: General: The patient is a well-developed, slightly build female in no acute distress. Vitals: BP 138/79   Pulse 94   Temp 98.2 F (36.8 C)  Extraoral Exam:  There is no palpable lymphadenopathy.  The patient denies acute  TMJ Symptoms. Intraoral  Exam: The patient has normal saliva. There are no soft tissue lesions. Dentition: The patient is missing tooth numbers 1, 16, 19, 30. Patient has had previous orthodontic therapy moving tooth numbers 17 and 18 and tooth numbers 31 and 32 mesially to close diastemas. The patient has lower anterior crowding. Periodontal: The patient has chronic periodontitis with plaque and calculus accumulations, selective areas gingival recession and no significant tooth mobility. Caries:  Extensive dental caries #2 and #15 with probable pulpal involvement. Endodontic:  Patient has a history of acute pulpitis symptoms involving  tooth #2 and possibly #15. There do not appear to be any periapical radiolucencies.  There does appear to be a mid root radiolucenc involving #11 that may represent internal/external resorption. C&B: Patient has crown the tooth numbers 3, 18, and 31. The patient also has porcelain veneers from tooth numbers 5 through 12. Prosthodontic: No partial dentures. Occlusion:  The occlusion is stable at this time. Radiographic Interpretation: An orthopantogram was taken and supplemented with a full series of dental radiographs. There are multiple missing teeth. There is supra-eruption and drifting of the unopposed teeth into the edentulous areas. There is incipient to moderate bone loss. There are no obvious periapical radiolucencies. Dental caries #2 on the mesial and # 15 mesial with probable pulpal involvement. Questionable internal or external resorption involving mid root of tooth #11.  Assessments: 1. Extensive dental caries #2 and #15 with probable pulpal involvement 2  History of acute pulpitis symptoms 3. Chronic periodontitis with incipient to moderate bone loss 4. No significant tooth mobility 5. Selective areas of gingival recession 6. Accretions 7. Multiple flexure lesions 8. Multiple missing teeth 9. History of orthodontic therapy with extraction of tooth numbers 19 and 30 and closure of space as indicated. 10. Poor occlusal scheme but a stable occlusion 11. Questionable external or internal results and associated with tooth #11.  Plan:  I discussed the risks, benefits, His various treatment after the patient in relationship to her medical and dental conditions. We discussed various treatment options to include no treatment, extraction of tooth numbers 2 and 15 with alveoloplasty, pre-prosthetic surgery as needed, periodontal therapy, dental restorations, root canal therapy, crown and bridge therapy, implant therapy, and replacement of missing teeth is indicated. We also discussed referral  to an oral surgeon and/or endodontist as indicated.  The patient currently agrees to proceed with periodontal therapy along with extraction of tooth numbers 2 and 15 with alveoloplasty and pre-prosthetic surgery as needed. The patient will then need to follow-up with an endodontist for evaluation for endodontic or surgical treatment of tooth #11. #11 may not be able to be restored and may need to be extracted with subsequent placement of an implant and prosthodontic rehabilitation with a crown.  Procedure: 1. Periodic oral exam 2. Orthopantogram 3. Full series of dental radiographs 4. Adult prophylaxis with Cavitron without complications. 5. Extraction of tooth numbers 2 and 15, see separate operative report. 6. Vicodin 5/325 take 1-2 tablets by mouth every 6 hours as needed for dental pain. #20. No refills. PMP aware was reviewed. 7. Return to clinic for suture removal as needed.  Patient tolerated procedures well. Postop vital signs: BP: 113/69 pulse: 82. Patient dismissed in stable condition.   Lenn Cal, DDS

## 2017-11-03 NOTE — Progress Notes (Signed)
OPERATIVE REPORT  Patient:            Alexandra Lewis Date of Birth:  [birth year] MRN:                [id number]   DATE OF PROCEDURE:  11/03/2017  PREOPERATIVE DIAGNOSES: 1. History of acute pulpitis 2. Dental caries 3. Fractured dental restorations 4. Chronic periodontitis 5. Accretions  POSTOPERATIVE DIAGNOSES: 1. History of acute pulpitis 2. Dental caries 3. Fractured dental restorations 4. Chronic periodontitis 5. Accretions  OPERATIONS: 1. Multiple extraction of tooth numbers 2 and 15 with minor alveoloplasty. 2. Adult prophylaxis   SURGEON: Lenn Cal, DDS  ASSISTANT: Arnoldo Hooker (dental assistant)  ANESTHESIA: Local anesthesia only.  MEDICATIONS: 1. Local anesthesia with a total utilization of 2 carpules each containing 34 mg of lidocaine with 0.017 mg of epinephrine as well as 3 carpules each containing 54 mg of mepivicaine with no epinephrine.  SPECIMENS: There are 2 teeth that were discarded.  DRAINS: None  CULTURES: None  COMPLICATIONS: None  ESTIMATED BLOOD LOSS: less than 15 mLs.  INTRAVENOUS FLUIDS: None  INDICATIONS: The patient was recently diagnosed with history of acute pulpitis.  A dental examination was then performed.  The patient was examined and treatment planned for extraction of tooth numbers 2 and 15 with alveoloplasty and adult prophylaxis of remaining dentition.  This treatment plan was formulated to decrease the risks and complications associated with dental infection from affecting the patient's systemic health.  OPERATIVE FINDINGS: Patient was examined and dental operatory #1.  The teeth were identified for extraction. The patient was noted be affected by history of acute pulpitis, dental caries, fractured dental restorations,  chronic periodontitis, and accretions.   DESCRIPTION OF PROCEDURE: Patient was brought to dental operative for #1.  Preop vitals were obtained in a sitting position. Informed consent  was obtained for extraction tooth numbers 2 and 15 with alveoloplasty and initial adult prophylaxis. Patient was then placed in the supine position in the operatory. The patient was then prepped and draped in the usual manner for dental medicine procedure. A timeout was performed. The patient was identified and procedures were verified. The oral cavity was then thoroughly examined with the findings noted above. The patient was then ready for dental medicine procedure as follows:  Local anesthesia was then administered sequentially with a total utilization of 2 carpules each containing 34 mg of lidocaine with 0.017 mg of epinephrine as well as 3 carpules  each containing 54 mg of mepivicaine with NO epinephrine.  The Maxillary left and right quadrants first approached. Anesthesia was then delivered utilizing infiltration with lidocaine with epinephrine. Additional bupivacaine was infiltrated as needed.  Tooth #2 was then subluxated and removed with a 150 forceps without complications. The sockets were curetted and compressed appropriately. Surgical site was then irrigated with copious amounts sterile saline. A piece of Surgifoam was placed in the extraction socket. The surgical site was then closed utilizing 3-0 chromic gut suture in a continuous interrupted suture technique 1.  Tooth #15 was then approached.The tissues were removed from the tooth utilizing a elevator. Tooth #15 was then subluxated with a series straight elevators. Tooth #15 was then removed with a 150 forceps without complications. 15 blade incision was then made from the maxillary left tuberosity and extended to the mesial of #15. A series of 15 blade incisions were made to remove redundant tissue. The surgical site was then irrigated with copious amounts sterile saline. Piece of Surgifoam was placed in  the extraction sockets appropriately. The surgical site was then closed from the maxillary left tuberosity and extended the distal of #14  utilizing 3-0 chromic gut suture in a continuous interrupted suture technique 1. One additional interrupted sutures then placed further closed surgical site.  At this point time, the remaining dentition was approached. A Cavitron was used to remove accretions as needed. A series of hand curettes were then used to further remove accretions. This completed the adult prophylaxis procedure. Oral hygiene instructions was provided.  At this point time, the entire mouth was irrigated with copious amounts of sterile saline. The patient was examined for complications, seeing none, the dental medicine procedure was deemed to be complete. The throat pack was removed at this time. A series of 4 x 4 gauze were placed in the mouth to aid hemostasis. The patient was then Had postoperative vital signs obtained in the sitting position.  Postop blood pressure was  113/69  P:  82 After an appropriate amount of time, the patient was discharged to the care of her caregiver. All counts were correct for the dental medicine procedure.   Lenn Cal, DDS.

## 2017-11-14 ENCOUNTER — Ambulatory Visit: Payer: Medicare Other | Admitting: Internal Medicine

## 2017-11-14 DIAGNOSIS — L821 Other seborrheic keratosis: Secondary | ICD-10-CM | POA: Diagnosis not present

## 2017-11-14 DIAGNOSIS — Z1283 Encounter for screening for malignant neoplasm of skin: Secondary | ICD-10-CM | POA: Diagnosis not present

## 2017-11-14 DIAGNOSIS — L905 Scar conditions and fibrosis of skin: Secondary | ICD-10-CM | POA: Diagnosis not present

## 2017-11-14 DIAGNOSIS — L853 Xerosis cutis: Secondary | ICD-10-CM | POA: Diagnosis not present

## 2017-11-14 DIAGNOSIS — D229 Melanocytic nevi, unspecified: Secondary | ICD-10-CM | POA: Diagnosis not present

## 2017-11-14 DIAGNOSIS — L820 Inflamed seborrheic keratosis: Secondary | ICD-10-CM | POA: Diagnosis not present

## 2017-11-14 DIAGNOSIS — D485 Neoplasm of uncertain behavior of skin: Secondary | ICD-10-CM | POA: Diagnosis not present

## 2017-11-14 DIAGNOSIS — D1800 Hemangioma unspecified site: Secondary | ICD-10-CM | POA: Diagnosis not present

## 2017-11-20 ENCOUNTER — Other Ambulatory Visit: Payer: Self-pay | Admitting: Internal Medicine

## 2017-11-20 MED ORDER — BUTALBITAL-ASA-CAFF-CODEINE 50-325-40-30 MG PO CAPS: 2.0000 | ORAL_CAPSULE | ORAL | 5 refills | Status: DC | PRN

## 2017-11-26 MED FILL — PROCHLORPERAZINE 5 MG TAB: 5 | 7 days supply | Qty: 60 | Fill #4

## 2017-12-06 MED FILL — PANTOPRAZOLE SOD DR 40 MG T: 40 | 90 days supply | Qty: 90 | Fill #3

## 2017-12-18 ENCOUNTER — Other Ambulatory Visit: Payer: Self-pay | Admitting: Internal Medicine

## 2017-12-19 ENCOUNTER — Other Ambulatory Visit: Payer: Self-pay | Admitting: Internal Medicine

## 2017-12-19 ENCOUNTER — Ambulatory Visit: Payer: Medicare Other | Admitting: Internal Medicine

## 2017-12-19 MED FILL — diazePAM 2 MG TABS: 2 | 7 days supply | Qty: 30 | Fill #0

## 2017-12-19 MED FILL — PROCHLORPERAZINE 5 MG TAB: 5 | 7 days supply | Qty: 60 | Fill #5

## 2017-12-19 NOTE — Telephone Encounter (Signed)
Refilled: 06/19/2017 Last OV: 06/19/2017 Next OV: 02/13/2018

## 2017-12-20 NOTE — Telephone Encounter (Signed)
Printed, signed and faxed.  

## 2018-01-15 MED FILL — VIT D2 1.25 MG (50,000 UNIT: 1.25 MG | 84 days supply | Qty: 12 | Fill #2

## 2018-01-15 MED FILL — DEPAKOTE DR 125 MG TABLET: 125 | 90 days supply | Qty: 90 | Fill #1

## 2018-02-13 ENCOUNTER — Ambulatory Visit: Payer: Medicare Other | Admitting: Internal Medicine

## 2018-02-13 DIAGNOSIS — Z0289 Encounter for other administrative examinations: Secondary | ICD-10-CM

## 2018-03-01 ENCOUNTER — Other Ambulatory Visit: Payer: Self-pay | Admitting: Internal Medicine

## 2018-03-01 ENCOUNTER — Ambulatory Visit: Payer: 59 | Admitting: Internal Medicine

## 2018-03-01 MED ORDER — BUTALBITAL-ASA-CAFF-CODEINE 50-325-40-30 MG PO CAPS: 2.0000 | ORAL_CAPSULE | ORAL | 5 refills | Status: DC | PRN

## 2018-03-01 MED ORDER — ONDANSETRON HCL 4 MG PO TABS: 4.0000 mg | ORAL_TABLET | Freq: Three times a day (TID) | ORAL | 5 refills | Status: DC | PRN

## 2018-03-04 ENCOUNTER — Other Ambulatory Visit: Payer: Self-pay | Admitting: Internal Medicine

## 2018-03-04 DIAGNOSIS — T39095S Adverse effect of salicylates, sequela: Secondary | ICD-10-CM

## 2018-03-04 MED FILL — PANTOPRAZOLE SOD DR 40 MG T: 40 | 90 days supply | Qty: 90 | Fill #0

## 2018-03-27 ENCOUNTER — Encounter: Payer: Self-pay | Admitting: Internal Medicine

## 2018-03-27 ENCOUNTER — Ambulatory Visit (INDEPENDENT_AMBULATORY_CARE_PROVIDER_SITE_OTHER): Payer: 59 | Admitting: Internal Medicine

## 2018-03-27 VITALS — BP 96/74 | HR 64 | Temp 97.6°F | Resp 15 | Ht 64.0 in | Wt 89.1 lb

## 2018-03-27 DIAGNOSIS — Z0000 Encounter for general adult medical examination without abnormal findings: Secondary | ICD-10-CM | POA: Diagnosis not present

## 2018-03-27 DIAGNOSIS — R5383 Other fatigue: Secondary | ICD-10-CM | POA: Diagnosis not present

## 2018-03-27 DIAGNOSIS — Z23 Encounter for immunization: Secondary | ICD-10-CM

## 2018-03-27 DIAGNOSIS — R0602 Shortness of breath: Secondary | ICD-10-CM

## 2018-03-27 DIAGNOSIS — Z8673 Personal history of transient ischemic attack (TIA), and cerebral infarction without residual deficits: Secondary | ICD-10-CM

## 2018-03-27 DIAGNOSIS — R634 Abnormal weight loss: Secondary | ICD-10-CM | POA: Diagnosis not present

## 2018-03-27 DIAGNOSIS — M810 Age-related osteoporosis without current pathological fracture: Secondary | ICD-10-CM

## 2018-03-27 DIAGNOSIS — E538 Deficiency of other specified B group vitamins: Secondary | ICD-10-CM | POA: Diagnosis not present

## 2018-03-27 DIAGNOSIS — Z1239 Encounter for other screening for malignant neoplasm of breast: Secondary | ICD-10-CM | POA: Diagnosis not present

## 2018-03-27 LAB — COMPREHENSIVE METABOLIC PANEL
ALK PHOS: 95 U/L (ref 39–117)
ALT: 12 U/L (ref 0–35)
AST: 15 U/L (ref 0–37)
Albumin: 4.3 g/dL (ref 3.5–5.2)
BUN: 13 mg/dL (ref 6–23)
CO2: 28 mEq/L (ref 19–32)
CREATININE: 1.09 mg/dL (ref 0.40–1.20)
Calcium: 9.3 mg/dL (ref 8.4–10.5)
Chloride: 100 mEq/L (ref 96–112)
GFR: 54.15 mL/min — ABNORMAL LOW (ref >60.00)
GLUCOSE: 122 mg/dL — AB (ref 70–99)
Potassium: 4.3 mEq/L (ref 3.5–5.1)
Sodium: 136 mEq/L (ref 135–145)
TOTAL PROTEIN: 6.3 g/dL (ref 6.0–8.3)
Total Bilirubin: 0.3 mg/dL (ref 0.2–1.2)

## 2018-03-27 LAB — CBC WITH DIFFERENTIAL/PLATELET
BASOS ABS: 0.1 10*3/uL (ref 0.0–0.1)
Basophils Relative: 1.2 % (ref 0.0–3.0)
Eosinophils Absolute: 0 10*3/uL (ref 0.0–0.7)
Eosinophils Relative: 0.9 % (ref 0.0–5.0)
HCT: 40.3 % (ref 36.0–46.0)
Hemoglobin: 13.9 g/dL (ref 12.0–15.0)
LYMPHS ABS: 0.7 10*3/uL (ref 0.7–4.0)
Lymphocytes Relative: 16.7 % (ref 12.0–46.0)
MCHC: 34.6 g/dL (ref 30.0–36.0)
MCV: 88.2 fl (ref 78.0–100.0)
MONOS PCT: 7.5 % (ref 3.0–12.0)
Monocytes Absolute: 0.3 10*3/uL (ref 0.1–1.0)
NEUTROS ABS: 3.1 10*3/uL (ref 1.4–7.7)
NEUTROS PCT: 73.7 % (ref 43.0–77.0)
PLATELETS: 196 10*3/uL (ref 150.0–400.0)
RBC: 4.57 Mil/uL (ref 3.87–5.11)
RDW: 13 % (ref 11.5–15.5)
WBC: 4.2 10*3/uL (ref 4.0–10.5)

## 2018-03-27 LAB — VITAMIN B12: Vitamin B-12: 809 pg/mL (ref 211–911)

## 2018-03-27 LAB — TSH: TSH: 1.32 u[IU]/mL (ref 0.35–4.50)

## 2018-03-27 MED ORDER — CYANOCOBALAMIN 1000 MCG/ML IJ SOLN: 1000.0000 ug | INTRAMUSCULAR | 5 refills | Status: DC

## 2018-03-27 MED ORDER — ZOLMITRIPTAN 2.5 MG PO TABS: ORAL_TABLET | ORAL | 5 refills | Status: DC

## 2018-03-27 MED FILL — CYANOCOBALAMIN 1,000 MCG/ML: 1000 | 90 days supply | Qty: 6 | Fill #0

## 2018-03-27 NOTE — Progress Notes (Signed)
Subjective:  Patient ID: Alexandra Lewis, female    DOB: 04-Sep-1955  Age: 62 y.o. MRN: [id number]  CC: The primary encounter diagnosis was Fatigue, unspecified type. Diagnoses of B12 deficiency, Shortness of breath, Breast cancer screening, Age-related osteoporosis without current pathological fracture, Need for immunization against influenza, Encounter for Medicare annual wellness exam, History of lacunar cerebrovascular accident, and WEIGHT LOSS-ABNORMAL were also pertinent to this visit.  HPI Alexandra Lewis presents for follow up on multiple issues including mood disorder,  Migraine disorder, underweight secondary to IBS and chronic nausea and vomiting  Weight appears to be stable at 89 lbs despite having had recent dental surgery   Had dental surgery in June ; had extraction of 2 teeth due to dental caries and acute pulpitis.  Bone loss was noted  .  Blames her inability to tolerate good nutrition  . Can't tolerate chocolate Ensure, only the mixed berry flavor.   Has not had mammogram or dexa  Done . Last mammogram  in 2016  At [facility]   Mother had BRCA and patient's BRCA 1 & 2  screen was negative through commercial testing (" 24 [employer]"). .requests that we reschedule for [facility] at [facility]  On a Thursday or Friday afternoon  Has had Prior treatment for osteoporosis:  Did not tolerate forteo or alendronate .  trial of Forteo was only for 3 doses. Never started the Evista .  Taking vitamin D , no calcium supplements.    Exertional Fatigue:    Has to sit down and rest  after her shower feels short of breath.  Has been sedentary for years. Doesn't sleep well, mother has been sick,  Having nightmares but not nightly .   Still bothered by her father's death ,  Gets bothered by everyone else's  Problems. Does a few  household chores,  Heavy ones make her joints hurt. No orthopnea , chest pain or leg swelling.  Migraines:  chronoic since 1973  Develops transient vision loss in left  eye   As an aura.  MANAGED BY [facility]   history of left sided posterior corona radiata infarct in 2013    Outpatient Medications Prior to Visit  Medication Sig Dispense Refill  . butalbital-aspirin-caffeine-codeine (FIORINAL WITH CODEINE) 50-325-40-30 MG capsule Take 2 capsules by mouth every 4 (four) hours as needed for pain. 180 capsule 5  . DEPAKOTE 125 MG DR tablet TAKE 1 TABLET BY MOUTH DAILY 90 tablet 2  . diazepam (VALIUM) 2 MG tablet TAKE 1 TABLET BY MOUTH EVERY 6 HOURS AS NEEDED FOR ANXIETY 30 tablet 5  . ergocalciferol (DRISDOL) 50000 units capsule Take 1 capsule (50,000 Units total) by mouth once a week. 12 capsule 3  . INS SYRINGE/NEEDLE 1CC/28G (B-D INSULIN SYRINGE 1CC/28G) 28G X 1/2" 1 ML MISC For use with b12 injections 10 each 3  . ondansetron (ZOFRAN) 4 MG tablet Take 1 tablet (4 mg total) by mouth every 8 (eight) hours as needed for nausea or vomiting. 30 tablet 5  . pantoprazole (PROTONIX) 40 MG tablet TAKE 1 TABLET BY MOUTH ONCE DAILY 90 tablet 3  . cyanocobalamin (,VITAMIN B-12,) 1000 MCG/ML injection Inject 1 mL (1,000 mcg total) into the skin every 14 (fourteen) days. 10 mL 5  . ZOLMitriptan (ZOMIG) 2.5 MG tablet 1/2 to 1 tablet as needed for migraine headaches 8 tablet 5  . prochlorperazine (COMPAZINE) 5 MG tablet Taking 1-2 tablets every 6 hours as needed for nausea and vomiting. 60 tablet 5  .  raloxifene (EVISTA) 60 MG tablet Take 1 tablet (60 mg total) by mouth daily. (Patient not taking: Reported on 11/03/2017) 30 tablet 11   No facility-administered medications prior to visit.     Review of Systems;  Patient denies headache, fevers, malaise, unintentional weight loss, skin rash, eye pain, sinus congestion and sinus pain, sore throat, dysphagia,  hemoptysis , cough, dyspnea, wheezing, chest pain, palpitations, orthopnea, edema, abdominal pain, nausea, melena, diarrhea, constipation, flank pain, dysuria, hematuria, urinary  Frequency, nocturia, numbness, tingling,  seizures,  Focal weakness, Loss of consciousness,  Tremor, insomnia, depression, anxiety, and suicidal ideation.      Objective:  BP 96/74 (BP Location: Left Arm, Patient Position: Sitting, Cuff Size: Normal)   Pulse 64   Temp 97.6 F (36.4 C) (Oral)   Resp 15   Ht 5' 4"  (1.626 m)   Wt 89 lb 1.9 oz (40.4 kg)   SpO2 96%   BMI 15.30 kg/m   BP Readings from Last 3 Encounters:  03/27/18 96/74  11/03/17 138/79  06/19/17 108/64    Wt Readings from Last 3 Encounters:  03/27/18 89 lb 1.9 oz (40.4 kg)  06/19/17 89 lb 12.8 oz (40.7 kg)  10/25/16 94 lb 6.4 oz (42.8 kg)    General appearance: alert, cooperative and appears stated age Ears: normal TM's and external ear canals both ears Throat: lips, mucosa, and tongue normal; teeth and gums normal Neck: no adenopathy, no carotid bruit, supple, symmetrical, trachea midline and thyroid not enlarged, symmetric, no tenderness/mass/nodules Back: symmetric, no curvature. ROM normal. No CVA tenderness. Lungs: clear to auscultation bilaterally Heart: regular rate and rhythm, S1, S2 normal, no murmur, click, rub or gallop Abdomen: soft, non-tender; bowel sounds normal; no masses,  no organomegaly Pulses: 2+ and symmetric Skin: Skin color, texture, turgor normal. No rashes or lesions Lymph nodes: Cervical, supraclavicular, and axillary nodes normal.  No results found for: HGBA1C  Lab Results  Component Value Date   CREATININE 1.09 03/27/2018   CREATININE 0.82 06/19/2017   CREATININE 0.94 10/25/2016    Lab Results  Component Value Date   WBC 4.2 03/27/2018   HGB 13.9 03/27/2018   HCT 40.3 03/27/2018   PLT 196.0 03/27/2018   GLUCOSE 122 (H) 03/27/2018   CHOL 252 (H) 12/29/2015   TRIG 79.0 12/29/2015   HDL 104.30 12/29/2015   LDLDIRECT 112.0 12/29/2015   LDLCALC 132 (H) 12/29/2015   ALT 12 03/27/2018   AST 15 03/27/2018   NA 136 03/27/2018   K 4.3 03/27/2018   CL 100 03/27/2018   CREATININE 1.09 03/27/2018   BUN 13 03/27/2018    CO2 28 03/27/2018   TSH 1.32 03/27/2018    Mr Brain W Wo Contrast  Result Date: 05/18/2015 CLINICAL DATA:  62 year old female with posterior headaches with visual aura. Symptoms for 6 months. Initial encounter. 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 EXAM: MRI HEAD WITHOUT AND WITH CONTRAST TECHNIQUE: Multiplanar, multiecho pulse sequences of the brain and surrounding structures were obtained without and with intravenous contrast. CONTRAST:  2m MULTIHANCE GADOBENATE DIMEGLUMINE 529 MG/ML IV SOLN COMPARISON:  Brain MRI 12/08/2011. FINDINGS: No restricted diffusion to suggest acute infarction. No midline shift, mass effect, evidence of mass lesion, ventriculomegaly, extra-axial collection or acute intracranial hemorrhage. Cervicomedullary junction and pituitary are within normal limits. Negative visualized cervical spine. Major intracranial vascular flow voids are stable. Cerebral volume is stable since 2013. Expected evolution of the small posterior left corona radiata lacunar infarct. No associated hemosiderin. No cortical encephalomalacia, and  elsewhere Stable and normal for age gray and white matter signal. No abnormal enhancement identified. No dural thickening identified. Visible internal auditory structures appear normal. Mastoids are clear. Mild paranasal sinus mucosal thickening is stable. Orbit and scalp soft tissues are within normal limits. IMPRESSION: 1.  No acute intracranial abnormality. 2. Negative for age MRI appearance of the brain aside from expected chronic appearance of the 2013 posterior left corona radiata lacunar infarct. Electronically Signed   By: Genevie Ann M.D.   On: 05/18/2015 16:19    Assessment & Plan:   Problem List Items Addressed This Visit    Fatigue - Primary    She has fatigue with minimal exertion (showering) with no signs of systolic dysfunction (BNP normal ,  Exam normal).  Labs are normal as well ruling out anemia,  Thyroid,  Renal disease.  Will  refer to cardiology per husband's request for cardiac evaluation, followed by referral to rehab if normal.       Relevant Orders   TSH (Completed)   CBC with Differential/Platelet (Completed)   Comprehensive metabolic panel (Completed)   ECHOCARDIOGRAM COMPLETE   Ambulatory referral to Cardiology   History of lacunar cerebrovascular accident    Left posterior corona radiata 2013.  No deficits.       Osteoporosis    SECONDARY TO UNDERWEIGHT AND NUTRITIONAL DEFICIENCIES.  Awaiting baseline DEXA.  Will initiate trial of Evista once baseline is determined.        Relevant Orders   DG Bone Density   WEIGHT LOSS-ABNORMAL    Secondary to vagus nerve damage.  She did not tolerate remeron for appetite stimulation       Other Visit Diagnoses    B12 deficiency       Relevant Medications   cyanocobalamin (,VITAMIN B-12,) 1000 MCG/ML injection   Other Relevant Orders   Vitamin B12 (Completed)   Shortness of breath       Relevant Orders   B Nat Peptide (Completed)   ECHOCARDIOGRAM COMPLETE   Ambulatory referral to Cardiology   Breast cancer screening       Relevant Orders   MM 3D SCREEN BREAST BILATERAL   Need for immunization against influenza       Relevant Orders   Flu Vaccine QUAD 6+ mos IM (Fluarix) (Completed)   Encounter for Medicare annual wellness exam         A total of 25 minutes of face to face time was spent with patient more than half of which was spent in counselling about the above mentioned conditions  and coordination of care  I have discontinued Alexandra Lewis "Chris"'s prochlorperazine and raloxifene. I am also having her maintain her INS SYRINGE/NEEDLE 1CC/28G, ergocalciferol, DEPAKOTE, diazepam, ondansetron, butalbital-aspirin-caffeine-codeine, pantoprazole, ZOLMitriptan, and cyanocobalamin.  Meds ordered this encounter  Medications  . ZOLMitriptan (ZOMIG) 2.5 MG tablet    Sig: 1/2 to 1 tablet as needed for migraine headaches    Dispense:  8 tablet     Refill:  5  . cyanocobalamin (,VITAMIN B-12,) 1000 MCG/ML injection    Sig: Inject 1 mL (1,000 mcg total) into the skin every 14 (fourteen) days.    Dispense:  10 mL    Refill:  5    Please supply 12 1 cc tuberculin syringes also    Medications Discontinued During This Encounter  Medication Reason  . prochlorperazine (COMPAZINE) 5 MG tablet Error  . ZOLMitriptan (ZOMIG) 2.5 MG tablet Reorder  . cyanocobalamin (,VITAMIN B-12,) 1000 MCG/ML injection Reorder  .  raloxifene (EVISTA) 60 MG tablet     Follow-up: No follow-ups on file.   Crecencio Mc, MD

## 2018-03-27 NOTE — Patient Instructions (Addendum)
You need 1800 mg calcium daily.  I recommend  That you start taking 2 Viactiv  Daily  And get the rest of your calcium through the Ensure  DEXA and mammogram will be scheduled for you   REFERRAL FOR ECHO AND FOLLOW UP WITH DR Rockey Situ

## 2018-03-28 DIAGNOSIS — R5382 Chronic fatigue, unspecified: Secondary | ICD-10-CM | POA: Insufficient documentation

## 2018-03-28 DIAGNOSIS — R5383 Other fatigue: Secondary | ICD-10-CM | POA: Insufficient documentation

## 2018-03-28 DIAGNOSIS — Z8673 Personal history of transient ischemic attack (TIA), and cerebral infarction without residual deficits: Secondary | ICD-10-CM | POA: Insufficient documentation

## 2018-03-28 LAB — BRAIN NATRIURETIC PEPTIDE: Brain Natriuretic Peptide: 109 pg/mL — ABNORMAL HIGH (ref <100)

## 2018-03-28 NOTE — Assessment & Plan Note (Signed)
Left posterior corona radiata 2013.  No deficits.

## 2018-03-28 NOTE — Assessment & Plan Note (Signed)
SECONDARY TO UNDERWEIGHT AND NUTRITIONAL DEFICIENCIES.  Awaiting baseline DEXA.  Will initiate trial of Evista once baseline is determined.

## 2018-03-28 NOTE — Assessment & Plan Note (Signed)
She has fatigue with minimal exertion (showering) with no signs of systolic dysfunction (BNP normal ,  Exam normal).  Labs are normal as well ruling out anemia,  Thyroid,  Renal disease.  Will refer to cardiology per husband's request for cardiac evaluation, followed by referral to rehab if normal.

## 2018-03-28 NOTE — Assessment & Plan Note (Signed)
Secondary to vagus nerve damage.  She did not tolerate remeron for appetite stimulation

## 2018-04-02 ENCOUNTER — Telehealth: Payer: Self-pay

## 2018-04-02 DIAGNOSIS — G43109 Migraine with aura, not intractable, without status migrainosus: Secondary | ICD-10-CM

## 2018-04-02 NOTE — Telephone Encounter (Signed)
PA for Zolmitriptan 2.5mg  has been approved through 04/01/2019.

## 2018-04-03 MED FILL — ZOLMitriptan 2.5 MG TBDP: 2.5 | 20 days supply | Qty: 8 | Fill #0

## 2018-04-05 MED FILL — diazePAM 2 MG TABS: 2 | 7 days supply | Qty: 30 | Fill #1

## 2018-04-09 MED FILL — DEPAKOTE DR 125 MG TABLET: 125 | 90 days supply | Qty: 90 | Fill #2

## 2018-04-11 ENCOUNTER — Other Ambulatory Visit: Payer: Medicare Other

## 2018-04-12 ENCOUNTER — Telehealth: Payer: Self-pay

## 2018-04-12 NOTE — Telephone Encounter (Signed)
Patient spouse has called now twice very rude to staff. I have received two calls from patient spouse Eupha Lobb   He is upset that we called the night before patient had Echocardiogram  He was upset for he cancelled a whole day of  clinic in order to bring patient  We explained to him calm that our Technician is under the weather and that was the reason and typically we find out the day of, this was a plus that we were able to call the night before the appointment so they would not make a trip out to office and find out.   He refused to understand was upset and when we offered the next available he was not willing to take it. Stating under his breath but loud enough for me to hear that "we are not even sure if we will still use you guys" and "not even sure if we want to have echo here anymore" he than said to keep the rescheduled appointment for 04/23/18 and he would wait to hear back from our Office manager  Not sure how else we could have help patient or how to help patient going forward.  Please advise

## 2018-04-23 ENCOUNTER — Encounter

## 2018-04-23 ENCOUNTER — Other Ambulatory Visit: Payer: Medicare Other

## 2018-04-23 NOTE — Telephone Encounter (Signed)
Patient spouse called needing to cancel today's appointment for patient has a Migraine will call back to reschedule after looking at his own schedule

## 2018-05-06 MED FILL — diazePAM 2 MG TABS: 2 | 7 days supply | Qty: 30 | Fill #2

## 2018-05-06 MED FILL — ONDANSETRON HCL 4 MG TABLET: 4 | 10 days supply | Qty: 30 | Fill #0

## 2018-05-23 MED FILL — ONDANSETRON HCL 4 MG TABLET: 4 | 10 days supply | Qty: 30 | Fill #1

## 2018-06-05 MED FILL — PANTOPRAZOLE SOD DR 40 MG T: 40 | 90 days supply | Qty: 90 | Fill #1

## 2018-06-05 MED FILL — VIT D2 1.25 MG (50,000 UNIT: 1.25 MG | 84 days supply | Qty: 12 | Fill #3

## 2018-06-28 ENCOUNTER — Telehealth: Payer: Self-pay

## 2018-06-28 ENCOUNTER — Other Ambulatory Visit: Payer: Self-pay | Admitting: Internal Medicine

## 2018-06-28 MED ORDER — ACETAMINOPHEN-CODEINE #3 300-30 MG PO TABS: 1.0000 | ORAL_TABLET | Freq: Four times a day (QID) | ORAL | 0 refills | Status: DC | PRN

## 2018-06-28 MED ORDER — BUTALBITAL-ASA-CAFF-CODEINE 50-325-40-30 MG PO CAPS: 2.0000 | ORAL_CAPSULE | ORAL | 5 refills | Status: DC | PRN

## 2018-06-28 NOTE — Telephone Encounter (Signed)
PLEASE LET HIM KNOW THAT I AM SEEING 4:30    I will call him after that,  But I am concerned that his wife has used up her #180 fioricet in less than 2 weeks and is not due for a refill until feb 21.    Can he shed any light on this?

## 2018-06-28 NOTE — Progress Notes (Signed)
Discussed patient's chronic use of fioricet with husband Ron . She has been advised that I will start weaning her down to a maximum of 4 to 6 daily and will be using Tylenol #3  To manage her pain

## 2018-06-28 NOTE — Telephone Encounter (Signed)
Spoke with Dr. Enrique Sack to let him know that you are seeing pt's until 4:30 or a little after but that you would give him a call after that. He also stated that the pt is taking eight fioricet per day but that they are trying to cut that done so that her tablets will last her 30 days.

## 2018-06-28 NOTE — Telephone Encounter (Signed)
Copied from [place] [phone]. Topic: General - Inquiry >> Jun 28, 2018 12:23 PM Vernona Rieger wrote: Reason for CRM: Patient's husband would like Dr Derrel Nip to give him a call about some things with her. He can be reached at his office for another hour [phone] or his cell [phone]

## 2018-07-11 ENCOUNTER — Other Ambulatory Visit: Payer: Self-pay | Admitting: Internal Medicine

## 2018-07-11 MED FILL — DIVALPROEX SOD DR 125 MG TA: 125 | 90 days supply | Qty: 90 | Fill #0

## 2018-07-19 MED FILL — ONDANSETRON HCL 4 MG TABLET: 4 | 10 days supply | Qty: 30 | Fill #2

## 2018-08-02 ENCOUNTER — Other Ambulatory Visit: Payer: Self-pay | Admitting: Internal Medicine

## 2018-08-08 ENCOUNTER — Other Ambulatory Visit: Payer: Medicare Other

## 2018-08-08 ENCOUNTER — Telehealth: Payer: Self-pay

## 2018-08-08 NOTE — Telephone Encounter (Signed)
Patient spouse calling Patient had an ECHO scheduled for 3/19 Wanted to cancel due to COVID-19

## 2018-08-08 NOTE — Telephone Encounter (Signed)
     Cardiac Questionnaire:    Since your last visit or hospitalization:    1. Have you been having new or worsening chest pain? NO   2. Have you been having new or worsening shortness of breath? NO 3. Have you been having new or worsening leg swelling, wt gain, or increase in abdominal girth (pants fitting more tightly)? NO   4. Have you had any passing out spells? NO    *A YES to any of these questions would result in the appointment being kept. *If all the answers to these questions are NO, we should indicate that given the current situation regarding the worldwide coronarvirus pandemic, at the recommendation of the CDC, we are looking to limit gatherings in our waiting area, and thus will reschedule their appointment beyond four weeks from today.   _____________       Primary Cardiologist:  [place] appointment SCHEDULED AT [place].  Patient contacted.  History reviewed.  No symptoms to suggest any unstable cardiac conditions.  Based on discussion, with current pandemic situation, we will be postponing this appointment for @PATIENTNAME @.  If symptoms change, she has been instructed to contact our office.  Routing to C19 CANCEL pool for tracking (P CV DIV CV19 CANCEL).  Roney Jaffe, RN  08/08/2018 12:51 PM         .

## 2018-08-09 MED FILL — ONDANSETRON HCL 4 MG TABLET: 4 | 10 days supply | Qty: 30 | Fill #3

## 2018-08-12 ENCOUNTER — Other Ambulatory Visit: Payer: Self-pay | Admitting: Internal Medicine

## 2018-08-12 MED FILL — diazePAM 2 MG TABS: 2 | 8 days supply | Qty: 30 | Fill #0

## 2018-08-12 NOTE — Telephone Encounter (Signed)
Refilled: 12/19/2017 Last OV: 03/27/2018 Next OV: not scheduled

## 2018-08-20 MED FILL — PANTOPRAZOLE SOD DR 40 MG T: 40 | 90 days supply | Qty: 90 | Fill #2

## 2018-08-28 NOTE — Telephone Encounter (Signed)
Patient cancelled previous echo due to Covid Risk.  Attempted to reschedule.  LMOV to call office to obtain Echo

## 2018-09-20 NOTE — Telephone Encounter (Signed)
No ans no vm   °

## 2018-09-26 ENCOUNTER — Other Ambulatory Visit: Payer: Self-pay | Admitting: Internal Medicine

## 2018-09-27 NOTE — Telephone Encounter (Signed)
Patient uses both for migraines and alternates the Florinal and the Tylenol #3 was recommended by PCP to reduce the amount of aspirin by alternating. Last OV 11/19 can we fill?

## 2018-09-27 NOTE — Telephone Encounter (Signed)
-----   Message from Gordy Councilman, [place] sent at 09/27/2018  3:02 PM EDT ----- Returning a call back from you.  [phone].  Nina,cma

## 2018-09-27 NOTE — Telephone Encounter (Signed)
Left message for patient to return call to offic epatient has 2 medications with codeine on chart is she taking both and for migraines?

## 2018-09-30 NOTE — Telephone Encounter (Signed)
Call pt I am helping cover for [place] and let patient that I refilled her Tylenol #3 for 2 weeks ; or 60 tablets versus the 120 in the past since it is a controlled substance.   Please advise she has a f/u with PCP as she needs to see her every 3 months since on a controlled substance.   Her  Last appt was 03/2018   Gifford Shave   I looked up patient on [place] Controlled Substances Reporting System and saw no activity that raised concern of inappropriate use.

## 2018-10-01 MED ORDER — ACETAMINOPHEN-CODEINE #3 300-30 MG PO TABS: 1.0000 | ORAL_TABLET | Freq: Four times a day (QID) | ORAL | 1 refills | Status: DC | PRN

## 2018-10-01 NOTE — Telephone Encounter (Signed)
Thanks Joycelyn Schmid .  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  So I sent a refill for #120 in today in the event that she hasn't filled it yet.

## 2018-10-02 MED FILL — diazePAM 2 MG TABS: 2 | 8 days supply | Qty: 30 | Fill #1

## 2018-10-02 MED FILL — ONDANSETRON HCL 4 MG TABLET: 4 | 10 days supply | Qty: 30 | Fill #4

## 2018-10-02 MED FILL — DIVALPROEX SOD DR 125 MG TA: 125 | 90 days supply | Qty: 90 | Fill #1

## 2018-10-16 NOTE — Telephone Encounter (Signed)
3 attempts to schedule this echo unable to reach  will mail letter .    Closing encounter

## 2018-11-04 MED FILL — CYANOCOBALAMIN 1,000 MCG/ML: 1000 | 90 days supply | Qty: 6 | Fill #1

## 2018-11-05 ENCOUNTER — Other Ambulatory Visit: Payer: 59

## 2018-11-15 ENCOUNTER — Other Ambulatory Visit: Payer: Self-pay | Admitting: Internal Medicine

## 2018-11-18 NOTE — Telephone Encounter (Signed)
Refilled: 07/11/2018 Last OV: 03/27/2018 Next OV: not scheduled

## 2018-11-19 MED FILL — PANTOPRAZOLE SOD DR 40 MG T: 40 | 90 days supply | Qty: 90 | Fill #3

## 2018-11-19 MED FILL — ZOLMitriptan 2.5 MG TBDP: 2.5 | 20 days supply | Qty: 8 | Fill #1

## 2018-11-21 DIAGNOSIS — Z20828 Contact with and (suspected) exposure to other viral communicable diseases: Secondary | ICD-10-CM | POA: Diagnosis not present

## 2018-11-29 ENCOUNTER — Ambulatory Visit (INDEPENDENT_AMBULATORY_CARE_PROVIDER_SITE_OTHER): Payer: 59

## 2018-11-29 ENCOUNTER — Other Ambulatory Visit: Payer: Self-pay

## 2018-11-29 DIAGNOSIS — R0602 Shortness of breath: Secondary | ICD-10-CM

## 2018-11-29 DIAGNOSIS — R5383 Other fatigue: Secondary | ICD-10-CM

## 2018-12-03 ENCOUNTER — Other Ambulatory Visit: Payer: Self-pay | Admitting: Internal Medicine

## 2018-12-04 MED FILL — VIT D2 1.25 MG (50,000 UNIT: 1.25 MG | 84 days supply | Qty: 12 | Fill #0

## 2018-12-04 NOTE — Telephone Encounter (Signed)
Refilled: 06/19/2017 Last OV: 03/27/2018 Next OV: not scheduled

## 2018-12-24 MED FILL — DIVALPROEX SOD DR 125 MG TA: 125 | 90 days supply | Qty: 90 | Fill #2

## 2019-01-06 ENCOUNTER — Other Ambulatory Visit: Payer: Self-pay

## 2019-01-06 ENCOUNTER — Telehealth: Payer: Self-pay | Admitting: *Deleted

## 2019-01-06 NOTE — Telephone Encounter (Signed)
Copied from [place] [phone]. Topic: General - Other >> Jan 06, 2019 12:26 PM Yvette Rack wrote: Reason for CRM: Pt returned call to office for screening. Attempted to transfer call to office but call was placed on hold. Pt requests call back.

## 2019-01-07 ENCOUNTER — Ambulatory Visit (INDEPENDENT_AMBULATORY_CARE_PROVIDER_SITE_OTHER): Payer: 59 | Admitting: Internal Medicine

## 2019-01-07 ENCOUNTER — Other Ambulatory Visit: Payer: Self-pay

## 2019-01-07 ENCOUNTER — Encounter: Payer: Self-pay | Admitting: Internal Medicine

## 2019-01-07 VITALS — BP 90/62 | HR 84 | Temp 97.6°F | Resp 16 | Ht 64.0 in | Wt 89.4 lb

## 2019-01-07 DIAGNOSIS — G8929 Other chronic pain: Secondary | ICD-10-CM | POA: Diagnosis not present

## 2019-01-07 DIAGNOSIS — M778 Other enthesopathies, not elsewhere classified: Secondary | ICD-10-CM | POA: Diagnosis not present

## 2019-01-07 DIAGNOSIS — Z23 Encounter for immunization: Secondary | ICD-10-CM

## 2019-01-07 DIAGNOSIS — M25531 Pain in right wrist: Secondary | ICD-10-CM

## 2019-01-07 DIAGNOSIS — R5383 Other fatigue: Secondary | ICD-10-CM | POA: Diagnosis not present

## 2019-01-07 DIAGNOSIS — M1990 Unspecified osteoarthritis, unspecified site: Secondary | ICD-10-CM

## 2019-01-07 MED FILL — diazePAM 2 MG TABS: 2 | 8 days supply | Qty: 30 | Fill #2

## 2019-01-07 NOTE — Assessment & Plan Note (Addendum)
Positive Tinel's sign, negative Phalen's. ddx includes CTS   And tendonitis and arthritis .  Wrist brace given,  And referral to [facility] for CTS

## 2019-01-07 NOTE — Patient Instructions (Signed)
Your pain may be coming from tendonitis,  Or from carpal tunnel syndrome  The brace will be helpful in both conditions  Referral to DR Manuella Ghazi for EMG/nerve conduction studies

## 2019-01-07 NOTE — Progress Notes (Signed)
Subjective:  Patient ID: Alexandra Lewis, female    DOB: 1956/04/14  Age: 63 y.o. MRN: [id number]  CC: The primary encounter diagnosis was Arthritis. Diagnoses of Fatigue, unspecified type, Tendonitis of wrist, right, Wrist pain, chronic, right, and Need for immunization against influenza were also pertinent to this visit.  HPI Alexandra Lewis presents for evaluation of right wrist pain that has been present for 2 months  Worse in the morning ("excruciating"), started her middle finger  Of dominant hand fingers and moved to  The wrist,  And then to the elbow.   Pain improves later on in the day but is never gone ; currently  2/10  Improves with tylenol but doesn't take it every day    Outpatient Medications Prior to Visit  Medication Sig Dispense Refill   acetaminophen-codeine (TYLENOL #3) 300-30 MG tablet Take 1 tablet by mouth every 6 (six) hours as needed for moderate pain. 120 tablet 1   butalbital-aspirin-caffeine-codeine (FIORINAL WITH CODEINE) 50-325-40-30 MG capsule TAKE TWO CAPSULES BY MOUTH EVERY 4 HOURS AS NEEDED FOR PAIN 180 capsule 4   cyanocobalamin (,VITAMIN B-12,) 1000 MCG/ML injection Inject 1 mL (1,000 mcg total) into the skin every 14 (fourteen) days. 10 mL 5   DEPAKOTE 125 MG DR tablet TAKE 1 TABLET BY MOUTH DAILY 90 tablet 2   diazepam (VALIUM) 2 MG tablet TAKE 1 TABLET BY MOUTH EVERY 6 HOURS AS NEEDED ANXIETY 30 tablet 2   INS SYRINGE/NEEDLE 1CC/28G (B-D INSULIN SYRINGE 1CC/28G) 28G X 1/2" 1 ML MISC For use with b12 injections 10 each 3   ondansetron (ZOFRAN) 4 MG tablet Take 1 tablet (4 mg total) by mouth every 8 (eight) hours as needed for nausea or vomiting. 30 tablet 5   pantoprazole (PROTONIX) 40 MG tablet TAKE 1 TABLET BY MOUTH ONCE DAILY 90 tablet 3   Vitamin D, Ergocalciferol, (DRISDOL) 1.25 MG (50000 UT) CAPS capsule TAKE 1 CAPSULE BY MOUTH ONCE A WEEK 12 capsule 3   ZOLMitriptan (ZOMIG) 2.5 MG tablet 1/2 to 1 tablet as needed for migraine  headaches 8 tablet 5   No facility-administered medications prior to visit.     Review of Systems;  Patient denies headache, fevers, malaise, unintentional weight loss, skin rash, eye pain, sinus congestion and sinus pain, sore throat, dysphagia,  hemoptysis , cough, dyspnea, wheezing, chest pain, palpitations, orthopnea, edema, abdominal pain, nausea, melena, diarrhea, constipation, flank pain, dysuria, hematuria, urinary  Frequency, nocturia, numbness, tingling, seizures,  Focal weakness, Loss of consciousness,  Tremor, insomnia, depression, anxiety, and suicidal ideation.      Objective:  BP 90/62 (BP Location: Left Arm, Patient Position: Sitting, Cuff Size: Normal)    Pulse 84    Temp 97.6 F (36.4 C) (Oral)    Resp 16    Ht 5\' 4"  (1.626 m)    Wt 89 lb 6.4 oz (40.6 kg)    SpO2 99%    BMI 15.35 kg/m   BP Readings from Last 3 Encounters:  01/07/19 90/62  03/27/18 96/74  11/03/17 138/79    Wt Readings from Last 3 Encounters:  01/07/19 89 lb 6.4 oz (40.6 kg)  03/27/18 89 lb 1.9 oz (40.4 kg)  06/19/17 89 lb 12.8 oz (40.7 kg)    General appearance: alert, cooperative and appears stated age Ears: normal TM's and external ear canals both ears Throat: lips, mucosa, and tongue normal; teeth and gums normal Neck: no adenopathy, no carotid bruit, supple, symmetrical, trachea midline and thyroid not enlarged, symmetric,  no tenderness/mass/nodules Back: symmetric, no curvature. ROM normal. No CVA tenderness. Lungs: clear to auscultation bilaterally Heart: regular rate and rhythm, S1, S2 normal, no murmur, click, rub or gallop Abdomen: soft, non-tender; bowel sounds normal; no masses,  no organomegaly Pulses: 2+ and symmetric Ext: right wrist without synovitis , erythema.  Pain elicited with forced supination  Skin: Skin color, texture, turgor normal. No rashes or lesions Lymph nodes: Cervical, supraclavicular, and axillary nodes normal.  No results found for: HGBA1C  Lab Results    Component Value Date   CREATININE 1.09 03/27/2018   CREATININE 0.82 06/19/2017   CREATININE 0.94 10/25/2016    Lab Results  Component Value Date   WBC 4.2 03/27/2018   HGB 13.9 03/27/2018   HCT 40.3 03/27/2018   PLT 196.0 03/27/2018   GLUCOSE 122 (H) 03/27/2018   CHOL 252 (H) 12/29/2015   TRIG 79.0 12/29/2015   HDL 104.30 12/29/2015   LDLDIRECT 112.0 12/29/2015   LDLCALC 132 (H) 12/29/2015   ALT 12 03/27/2018   AST 15 03/27/2018   NA 136 03/27/2018   K 4.3 03/27/2018   CL 100 03/27/2018   CREATININE 1.09 03/27/2018   BUN 13 03/27/2018   CO2 28 03/27/2018   TSH 1.32 03/27/2018    Mr Brain W Wo Contrast  Result Date: 05/18/2015 CLINICAL DATA:  63 year old female with posterior headaches with visual aura. Symptoms for 6 months. Initial encounter. 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 EXAM: MRI HEAD WITHOUT AND WITH CONTRAST TECHNIQUE: Multiplanar, multiecho pulse sequences of the brain and surrounding structures were obtained without and with intravenous contrast. CONTRAST:  87mL MULTIHANCE GADOBENATE DIMEGLUMINE 529 MG/ML IV SOLN COMPARISON:  Brain MRI 12/08/2011. FINDINGS: No restricted diffusion to suggest acute infarction. No midline shift, mass effect, evidence of mass lesion, ventriculomegaly, extra-axial collection or acute intracranial hemorrhage. Cervicomedullary junction and pituitary are within normal limits. Negative visualized cervical spine. Major intracranial vascular flow voids are stable. Cerebral volume is stable since 2013. Expected evolution of the small posterior left corona radiata lacunar infarct. No associated hemosiderin. No cortical encephalomalacia, and elsewhere Stable and normal for age gray and white matter signal. No abnormal enhancement identified. No dural thickening identified. Visible internal auditory structures appear normal. Mastoids are clear. Mild paranasal sinus mucosal thickening is stable. Orbit and scalp soft tissues are within  normal limits. IMPRESSION: 1.  No acute intracranial abnormality. 2. Negative for age MRI appearance of the brain aside from expected chronic appearance of the 2013 posterior left corona radiata lacunar infarct. Electronically Signed   By: Genevie Ann M.D.   On: 05/18/2015 16:19    Assessment & Plan:   Problem List Items Addressed This Visit      Unprioritized   Wrist pain, chronic, right    Positive Tinel's sign, negative Phalen's. ddx includes CTS   And tendonitis and arthritis .  Wrist brace given,  And referral to [facility] for CTS      Relevant Orders   Ambulatory referral to Neurology   Fatigue   Relevant Orders   Comprehensive metabolic panel    Other Visit Diagnoses    Arthritis    -  Primary   Relevant Orders   Sedimentation rate   C-reactive protein   CBC with Differential/Platelet   Comprehensive metabolic panel   Tendonitis of wrist, right       Need for immunization against influenza       Relevant Orders   Flu Vaccine QUAD 36+ mos IM (Completed)  I am having Alexandra Lewis "Gerald Stabs" maintain her INS SYRINGE/NEEDLE 1CC/28G, ondansetron, pantoprazole, ZOLMitriptan, cyanocobalamin, Depakote, diazepam, acetaminophen-codeine, butalbital-aspirin-caffeine-codeine, and Vitamin D (Ergocalciferol).  No orders of the defined types were placed in this encounter.   There are no discontinued medications.  Follow-up: No follow-ups on file.   Crecencio Mc, MD

## 2019-01-08 LAB — CBC WITH DIFFERENTIAL/PLATELET
Basophils Absolute: 0 10*3/uL (ref 0.0–0.1)
Basophils Relative: 0.7 % (ref 0.0–3.0)
Eosinophils Absolute: 0 10*3/uL (ref 0.0–0.7)
Eosinophils Relative: 0.2 % (ref 0.0–5.0)
HCT: 41.5 % (ref 36.0–46.0)
Hemoglobin: 14.1 g/dL (ref 12.0–15.0)
Lymphocytes Relative: 15.9 % (ref 12.0–46.0)
Lymphs Abs: 1.1 10*3/uL (ref 0.7–4.0)
MCHC: 33.9 g/dL (ref 30.0–36.0)
MCV: 89 fl (ref 78.0–100.0)
Monocytes Absolute: 0.6 10*3/uL (ref 0.1–1.0)
Monocytes Relative: 8.2 % (ref 3.0–12.0)
Neutro Abs: 5.4 10*3/uL (ref 1.4–7.7)
Neutrophils Relative %: 75 % (ref 43.0–77.0)
Platelets: 225 10*3/uL (ref 150.0–400.0)
RBC: 4.66 Mil/uL (ref 3.87–5.11)
RDW: 13.1 % (ref 11.5–15.5)
WBC: 7.2 10*3/uL (ref 4.0–10.5)

## 2019-01-08 LAB — C-REACTIVE PROTEIN: CRP: <1 mg/dL (ref 0.5–20.0)

## 2019-01-08 LAB — COMPREHENSIVE METABOLIC PANEL
ALT: 15 U/L (ref 0–35)
AST: 15 U/L (ref 0–37)
Albumin: 4.5 g/dL (ref 3.5–5.2)
Alkaline Phosphatase: 114 U/L (ref 39–117)
BUN: 18 mg/dL (ref 6–23)
CO2: 26 mEq/L (ref 19–32)
Calcium: 9.2 mg/dL (ref 8.4–10.5)
Chloride: 99 mEq/L (ref 96–112)
Creatinine, Ser: 0.97 mg/dL (ref 0.40–1.20)
GFR: 58.13 mL/min — ABNORMAL LOW (ref >60.00)
Glucose, Bld: 73 mg/dL (ref 70–99)
Potassium: 3.9 mEq/L (ref 3.5–5.1)
Sodium: 137 mEq/L (ref 135–145)
Total Bilirubin: 0.3 mg/dL (ref 0.2–1.2)
Total Protein: 6.4 g/dL (ref 6.0–8.3)

## 2019-01-08 LAB — SEDIMENTATION RATE: Sed Rate: 1 mm/hr (ref 0–30)

## 2019-01-28 ENCOUNTER — Other Ambulatory Visit: Payer: Self-pay | Admitting: Internal Medicine

## 2019-01-28 MED FILL — ONDANSETRON HCL 4 MG TABLET: 4 | 10 days supply | Qty: 30 | Fill #0

## 2019-01-28 MED FILL — diazePAM 2 MG TABS: 2 | 30 days supply | Qty: 60 | Fill #0

## 2019-01-28 NOTE — Telephone Encounter (Signed)
Refilled: 08/12/2018 Last OV: 01/07/2019 Next OV: not scheduled

## 2019-03-01 ENCOUNTER — Telehealth: Payer: 59 | Admitting: Physician Assistant

## 2019-03-01 DIAGNOSIS — R300 Dysuria: Secondary | ICD-10-CM

## 2019-03-01 MED ORDER — NITROFURANTOIN MONOHYD MACRO 100 MG PO CAPS: 100.0000 mg | ORAL_CAPSULE | Freq: Two times a day (BID) | ORAL | 0 refills | Status: DC

## 2019-03-01 NOTE — Progress Notes (Signed)
I have spent 5 minutes in review of e-visit questionnaire, review and updating patient chart, medical decision making and response to patient.   Alexandra Perillo Cody Cyle Kenyon, PA-C    

## 2019-03-01 NOTE — Progress Notes (Signed)

## 2019-03-03 ENCOUNTER — Other Ambulatory Visit: Payer: Self-pay | Admitting: Internal Medicine

## 2019-03-04 ENCOUNTER — Other Ambulatory Visit: Payer: Self-pay | Admitting: Internal Medicine

## 2019-03-04 DIAGNOSIS — T39095S Adverse effect of salicylates, sequela: Secondary | ICD-10-CM

## 2019-03-04 MED FILL — ZOLMitriptan 2.5 MG TBDP: 2.5 | 20 days supply | Qty: 8 | Fill #2

## 2019-03-04 MED FILL — PANTOPRAZOLE SOD DR 40 MG T: 40 | 90 days supply | Qty: 90 | Fill #0

## 2019-03-04 MED FILL — diazePAM 2 MG TABS: 2 | 30 days supply | Qty: 60 | Fill #1

## 2019-03-04 NOTE — Telephone Encounter (Signed)
Refilled: 10/01/2018 Last OV: 01/07/2019 Next OV: not scheduled

## 2019-03-07 ENCOUNTER — Other Ambulatory Visit: Payer: Self-pay

## 2019-03-07 DIAGNOSIS — Z20828 Contact with and (suspected) exposure to other viral communicable diseases: Secondary | ICD-10-CM | POA: Diagnosis not present

## 2019-03-07 DIAGNOSIS — Z20822 Contact with and (suspected) exposure to covid-19: Secondary | ICD-10-CM

## 2019-03-09 LAB — NOVEL CORONAVIRUS, NAA: SARS-CoV-2, NAA: NOT DETECTED

## 2019-03-17 ENCOUNTER — Other Ambulatory Visit: Payer: Self-pay | Admitting: Internal Medicine

## 2019-03-17 MED FILL — ONDANSETRON HCL 4 MG TABLET: 4 | 10 days supply | Qty: 30 | Fill #1

## 2019-03-17 MED FILL — VIT D2 1.25 MG (50,000 UNIT: 1.25 MG | 84 days supply | Qty: 12 | Fill #1

## 2019-03-17 NOTE — Telephone Encounter (Signed)
Refilled: 07/11/2018 Last OV: 01/07/2019 Next OV: not scheduled

## 2019-03-19 MED FILL — DIVALPROEX SOD DR 125 MG TA: 125 | 90 days supply | Qty: 90 | Fill #0

## 2019-03-26 ENCOUNTER — Other Ambulatory Visit: Payer: Self-pay | Admitting: Internal Medicine

## 2019-03-26 NOTE — Telephone Encounter (Signed)
Refilled: 11/19/2018 Last OV: 01/07/2019 Next OV: not scheduled

## 2019-05-17 MED ORDER — ZOLMITRIPTAN 2.5 MG PO TABS: ORAL_TABLET | ORAL | 5 refills | Status: DC

## 2019-05-26 MED FILL — diazePAM 2 MG TABS: 2 | 30 days supply | Qty: 60 | Fill #2

## 2019-05-26 MED FILL — PANTOPRAZOLE SOD DR 40 MG T: 40 | 90 days supply | Qty: 90 | Fill #1

## 2019-06-09 ENCOUNTER — Other Ambulatory Visit: Payer: 59

## 2019-06-09 ENCOUNTER — Ambulatory Visit: Payer: 59 | Attending: Internal Medicine

## 2019-06-09 ENCOUNTER — Other Ambulatory Visit: Payer: Self-pay

## 2019-06-09 ENCOUNTER — Telehealth: Payer: Self-pay

## 2019-06-09 DIAGNOSIS — M79604 Pain in right leg: Secondary | ICD-10-CM | POA: Insufficient documentation

## 2019-06-09 NOTE — Assessment & Plan Note (Signed)
Redness swelling x 2 weeks.  D Dimer and [place] ordered urgent to rule out DVT

## 2019-06-09 NOTE — Telephone Encounter (Signed)
Pt is scheduled for [place] at [facility] this afternoon.

## 2019-06-09 NOTE — Telephone Encounter (Signed)
Lab appt made for 3:15 today.

## 2019-06-09 NOTE — Telephone Encounter (Signed)
Order has been corrected.  

## 2019-06-09 NOTE — Telephone Encounter (Signed)
Pt's husband called and stated that he is concerned that the pt may have a DVT in her right leg. He stated that for about a week the pt has had right calf pain, warmth, redness, decreased mobility and no SOBr. He stated that the pain is worse in the morning until she gets up moving around. Pt's husband would like to see about getting an [place] or a D-dimer done today but needs to have it done after 3pm.

## 2019-06-10 NOTE — Telephone Encounter (Signed)
Pt husband called about the appts that was set up for his wife yesterday. Pt husband did not take her to the appts. He states he did not want to sit and wait with covid out here. I went to speak with nurse placed husband on hold he hung up.

## 2019-06-10 NOTE — Telephone Encounter (Signed)
Called and spoke with patients husband as he hung up while awaiting transfer to speak with me. I again explained all the processes that led up to this as he continued to be rude. At one point I did say to pt's husband that our office and the provider did everything we could to assist the patient even though the original "stat" request was not being handled in our office and that at no point did we "refuse" to assist he and our patient. I did advise the pt's husband that I witnessed his brutal conversation with the CMA and certainly this is no way to treat anyone. I also mentioned to him about his behavior to the [facility] staff as well. Pt's husband basically said he will continue to believe that this was our fault and rudely continued to blame the clinic. I advised him that we can agree to disagree respectfully. I again asked the pt's husband how would he like [place] to assist him as the CMA had already offered to make all the calls herself just to calm him down. Pt's husband was not hearing any of this he stated " I will let you all know what I decide to do" and hung up on me. 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

## 2019-06-10 NOTE — Telephone Encounter (Signed)
Called pt's husband back and he was very upset. He stated that we were very unprofessional because his wife never got her Korea and lab work done yesterday because no one called him back and his wife could be sitting there with a DVT. I apologized to the pt's husband for the miscommunication that occurred yesterday but that we did not know that when you order something as STAT that it goes into a different ordering system and no longer shows up in epic until scheduled. I explained to him that as soon as I realized the pt was scheduled for lab appt here in the office that something didn't seem right because she was supposed to have the lab work done at the same place as the [place], so I spoke with Dr. Derrel Nip about that verbal asking her if she ordered an [place] and she stated that she did. That is when we figured out everything about the STAT ordering system. Explained to the husband that Dr. Derrel Nip got on the phone immediately with our referral coordinator trying to figure out what was going on and get it fixed and scheduled. I reordered the STAT US and Melissa got it scheduled for 4pm yesterday. The husband stated well nobody called me back about the appt or else I would have taken her, he stated that he just assumed that since a lab appt was scheduled that Dr. Derrel Nip did not want to do the [place] so he brought his wife up here for the lab appt. He then said that the lab would not draw his wife's blood and told her that they would need to go over to [facility] and wait to get the [place] and lab work done but that he did not want to do that because of Covid. Husband just kept placing blame on our office and was disrespectful to me on the phone. I told the husband that I would do what ever I needed to do to get the pt an appt for today and I offered to transfer him to our office manager, Debbra Riding. He stated that he didn't know what that was going to do because he had already taken a day of off of work. He then processed to ask if we were going  to give him any money back for having to miss work because we sure can charge our patients $50 for a no show fee. I explained to him that I did not know the answer to that and that's when he stated that he would like to talk with the Geneticist, molecular. I then asked again and told him that I would make the calls myself to get the [place] rescheduled and he stated that he didn't know what he wanted to do. I placed him on hold to figure out Kerrica's office number to transfer and speak with Debbra Riding about what was going on and he hung up.

## 2019-06-16 MED FILL — DIVALPROEX SOD DR 125 MG TA: 125 | 90 days supply | Qty: 90 | Fill #1

## 2019-06-26 MED FILL — VIT D2 1.25 MG (50,000 UNIT: 1.25 MG | 84 days supply | Qty: 12 | Fill #2

## 2019-06-30 MED FILL — diazePAM 2 MG TABS: 2 | 30 days supply | Qty: 60 | Fill #3

## 2019-07-10 ENCOUNTER — Other Ambulatory Visit: Payer: Self-pay | Admitting: Internal Medicine

## 2019-07-10 NOTE — Telephone Encounter (Signed)
Butalbital   Refilled: 03/27/2019 Tylenol #3   Refilled: 03/04/2019  Last OV: 01/07/2019 Next OV: not scheduled

## 2019-08-04 ENCOUNTER — Telehealth: Payer: Self-pay | Admitting: Internal Medicine

## 2019-08-04 NOTE — Telephone Encounter (Signed)
Tuesday THE 23RD  AT 12:30 IS THE EARLIEST I CAN SEE HER

## 2019-08-04 NOTE — Telephone Encounter (Signed)
Pt husband called wanting to get his wife worked in for a sooner appt and labs work He stated that she is having swelling in the joints of her hand

## 2019-08-04 NOTE — Telephone Encounter (Signed)
Are you willing to work her in any sooner?

## 2019-08-05 NOTE — Telephone Encounter (Signed)
Spoke with pt's husband and pt has be rescheduled for a virtual visit on 08/12/2019.

## 2019-08-07 ENCOUNTER — Telehealth: Payer: Self-pay | Admitting: Internal Medicine

## 2019-08-07 NOTE — Telephone Encounter (Signed)
Pt wants to change 08/12/19 appt to in person-it would not allow me to change it. Please advise

## 2019-08-07 NOTE — Telephone Encounter (Signed)
Pt's appt has been changed to in office. Pt's husband is aware.

## 2019-08-12 ENCOUNTER — Ambulatory Visit: Payer: 59 | Admitting: Internal Medicine

## 2019-08-18 ENCOUNTER — Other Ambulatory Visit: Payer: Self-pay

## 2019-08-18 ENCOUNTER — Ambulatory Visit (INDEPENDENT_AMBULATORY_CARE_PROVIDER_SITE_OTHER): Payer: 59 | Admitting: Internal Medicine

## 2019-08-18 ENCOUNTER — Encounter: Payer: Self-pay | Admitting: Internal Medicine

## 2019-08-18 VITALS — BP 130/74 | HR 72 | Temp 97.2°F | Ht 64.0 in | Wt 90.8 lb

## 2019-08-18 DIAGNOSIS — M25541 Pain in joints of right hand: Secondary | ICD-10-CM

## 2019-08-18 DIAGNOSIS — G43109 Migraine with aura, not intractable, without status migrainosus: Secondary | ICD-10-CM

## 2019-08-18 DIAGNOSIS — M130 Polyarthritis, unspecified: Secondary | ICD-10-CM | POA: Diagnosis not present

## 2019-08-18 DIAGNOSIS — F1120 Opioid dependence, uncomplicated: Secondary | ICD-10-CM | POA: Diagnosis not present

## 2019-08-18 NOTE — Patient Instructions (Addendum)
We are running labs today to look for signs of inflammation  Return for x rays of right hand  If the sedimentation rate is elevated,  We can try a prednisone taper to get your pain under control

## 2019-08-18 NOTE — Progress Notes (Signed)
Subjective:  Patient ID: Alexandra Lewis, female    DOB: 01-Jan-1956  Age: 64 y.o. MRN: [id number]  CC: The primary encounter diagnosis was Polyarthritis. Diagnoses of Arthralgia of right hand, Polyarthritis of multiple sites, Migraine with aura and without status migrainosus, not intractable, and Narcotic dependency, continuous ([place]) were also pertinent to this visit.  HPI Alexandra Lewis presents for evaluation of diffuse joint pain.   This visit occurred during the SARS-CoV-2 public health emergency.  Safety protocols were in place, including screening questions prior to the visit, additional usage of staff PPE, and extensive cleaning of exam room while observing appropriate contact time as indicated for disinfecting solutions.   Patient is a 64 yr old female with a history of osteoporosis,  Migraine headaches,  Chronic pain with narcotic dependence , history of peptic ulcer disease , fatigue and underweight who presents with diffuse joint pain of 2 weeks duration. Symptoms Started in right hand and wrist several weeks ago, with swelling and redness of the MCPs accompanied by a drawing up of the fingers on the right hand.  The symptoms then began to involve "every joint in my body."  She has also noted mild swelling of both ankles. Joints have been tender swollen and most pronounced in the hands and feet .  Migrates to different joints . Several severe headaches during the past 2 weeks   She denies fevers,,  Travel,  Changes in medication or diet    Present for the past 2 weeks ankles swellin   Has not had any contact with anyone except a very brief interaction with a neighbor who was not ill,  And her  husband Ron who is a Pharmacist, community for the hospital and double masks at work    [place] of RA in a maternal aunt and a cousin from same family .     Outpatient Medications Prior to Visit  Medication Sig Dispense Refill  . diazepam (VALIUM) 2 MG tablet Take 1 tablet (2 mg total) by mouth every  12 (twelve) hours as needed for anxiety. 60 tablet 5  . divalproex (DEPAKOTE) 125 MG DR tablet TAKE 1 TABLET BY MOUTH ONCE A DAY 90 tablet 2  . Famotidine (PEPCID PO) Take by mouth.    . nitrofurantoin, macrocrystal-monohydrate, (MACROBID) 100 MG capsule Take 1 capsule (100 mg total) by mouth 2 (two) times daily. 10 capsule 0  . ondansetron (ZOFRAN) 4 MG tablet TAKE 1 TABLET BY MOUTH EVERY 8 HOURS AS NEEDED FOR NAUSEA OR VOMITING 30 tablet 4  . pantoprazole (PROTONIX) 40 MG tablet TAKE 1 TABLET BY MOUTH ONCE DAILY 90 tablet 3  . Vitamin D, Ergocalciferol, (DRISDOL) 1.25 MG (50000 UT) CAPS capsule TAKE 1 CAPSULE BY MOUTH ONCE A WEEK 12 capsule 3  . ZOLMitriptan (ZOMIG) 2.5 MG tablet 1/2 to 1 tablet as needed for migraine headaches 8 tablet 5  . ZOLMitriptan (ZOMIG) 2.5 MG tablet 1/2 to 1 tablet as needed for migraine headaches 8 tablet 5  . acetaminophen-codeine (TYLENOL #3) 300-30 MG tablet TAKE ONE TABLET BY MOUTH EVERY 6 HOURS AS NEEDED FOR MODERATE PAIN MAX OF 4 DAILY 120 tablet 1  . butalbital-aspirin-caffeine-codeine (FIORINAL WITH CODEINE) 50-325-40-30 MG capsule TAKE TWO CAPSULES BY MOUTH EVERY 4 HOURS AS NEEDED FOR PAIN 180 capsule 2  . cyanocobalamin (,VITAMIN B-12,) 1000 MCG/ML injection Inject 1 mL (1,000 mcg total) into the skin every 14 (fourteen) days. (Patient not taking: Reported on 08/18/2019) 10 mL 5  . INS SYRINGE/NEEDLE 1CC/28G (B-D  INSULIN SYRINGE 1CC/28G) 28G X 1/2" 1 ML MISC For use with b12 injections (Patient not taking: Reported on 08/18/2019) 10 each 3   No facility-administered medications prior to visit.    Review of Systems;  Patient denies , fevers, malaise, unintentional weight loss, skin rash, eye pain, sinus congestion and sinus pain, sore throat, dysphagia,  hemoptysis , cough, dyspnea, wheezing, chest pain, palpitations, orthopnea, , abdominal pain,, melena, diarrhea, constipation, flank pain, dysuria, hematuria, urinary  Frequency, nocturia, numbness, tingling,  seizures,  Focal weakness, Loss of consciousness,  Tremor, insomnia, depression, anxiety, and suicidal ideation.      Objective:  BP 130/74   Pulse 72   Temp (!) 97.2 F (36.2 C) (Temporal)   Ht _0  (1.626 m)   Wt 90 lb 12.8 oz (41.2 kg)   SpO2 99%   BMI 15.59 kg/m   BP Readings from Last 3 Encounters:  08/18/19 130/74  01/07/19 90/62  03/27/18 96/74    Wt Readings from Last 3 Encounters:  08/18/19 90 lb 12.8 oz (41.2 kg)  01/07/19 89 lb 6.4 oz (40.6 kg)  03/27/18 89 lb 1.9 oz (40.4 kg)    General appearance: alert, cooperative and appears stated age Ears: normal TM's and external ear canals both ears Throat: lips, mucosa, and tongue normal; teeth and gums normal Neck: no adenopathy, no carotid bruit, supple, symmetrical, trachea midline and thyroid not enlarged, symmetric, no tenderness/mass/nodules Back: symmetric, no curvature. ROM normal. No CVA tenderness. Lungs: clear to auscultation bilaterally Heart: regular rate and rhythm, S1, S2 normal, no murmur, click, rub or gallop Abdomen: soft, non-tender; bowel sounds normal; no masses,  no organomegaly Pulses: 2+ and symmetric Ext:  Synovitis of right hand MCPs, most pronounced 1st MCP with hypertrophied joint  Skin: Skin color, texture, turgor normal. No rashes or lesions.  Mild non pitting edema both ankles.  Lymph nodes: Cervical, supraclavicular, and axillary nodes normal.  No results found for: HGBA1C  Lab Results  Component Value Date   CREATININE 0.97 01/07/2019   CREATININE 1.09 03/27/2018   CREATININE 0.82 06/19/2017    Lab Results  Component Value Date   WBC 7.2 01/07/2019   HGB 14.1 01/07/2019   HCT 41.5 01/07/2019   PLT 225.0 01/07/2019   GLUCOSE 73 01/07/2019   CHOL 252 (H) 12/29/2015   TRIG 79.0 12/29/2015   HDL 104.30 12/29/2015   LDLDIRECT 112.0 12/29/2015   LDLCALC 132 (H) 12/29/2015   ALT 15 01/07/2019   AST 15 01/07/2019   NA 137 01/07/2019   K 3.9 01/07/2019   CL 99 01/07/2019    CREATININE 0.97 01/07/2019   BUN 18 01/07/2019   CO2 26 01/07/2019   TSH 1.32 03/27/2018     Assessment & Plan:   Problem List Items Addressed This Visit      Unprioritized   Migraine headache    Occurring 2-3 times per week, often  weather related/triggered. sometimes waking her up at night  USING  Fiorinal and zomig . Refills given       Relevant Medications   butalbital-aspirin-caffeine-codeine (FIORINAL WITH CODEINE) 50-325-40-30 MG capsule (Start on 09/10/2019)   acetaminophen-codeine (TYLENOL #3) 300-30 MG tablet (Start on 09/05/2019)   Polyarthritis of multiple sites    ddx includes SLE, Scleroderma, RA,  PMR, Hemochromatosis , gout .  Previous workup in august was normal .  ESR, CRP, uric acid,  Iron studies ordered along with plain films of tight hand given appearance to look for joint erosion.  She cannot  take NSAIDs due to history of ulcer .  Will prescribe prednisone taper if ESR is elevated. . Rheumatology referral will be recommended regardless of serologic testing      Narcotic dependency, continuous ([place])    Refill history confirmed via [place] Controlled Substance databas, accessed by me today.. she has been refilling the Fiorinal every 25 days.  I have rewritten the prescription to prevent use of more than 6 daily or #180 per month and will begin tapering monthly        Other Visit Diagnoses    Polyarthritis    -  Primary   Relevant Orders   Sedimentation rate   C-reactive protein   Uric acid   Comprehensive metabolic panel   CBC with Differential/Platelet   ANA w/Reflex if Positive   Arthralgia of right hand       Relevant Orders   DG Hand Complete Left      I have changed Alexandra Lewis "Chris"'s butalbital-aspirin-caffeine-codeine and acetaminophen-codeine. I am also having her maintain her INS SYRINGE/NEEDLE 1CC/28G, cyanocobalamin, Vitamin D (Ergocalciferol), diazepam, ondansetron, nitrofurantoin (macrocrystal-monohydrate), pantoprazole, divalproex,  ZOLMitriptan, ZOLMitriptan, and Famotidine (PEPCID PO).  Meds ordered this encounter  Medications  . butalbital-aspirin-caffeine-codeine (FIORINAL WITH CODEINE) 50-325-40-30 MG capsule    Sig: TAKE TWO CAPSULES BY MOUTH EVERY 6 HOURS AS NEEDED FOR PAIN. Max dose 6 daily    Dispense:  180 capsule    Refill:  2  . acetaminophen-codeine (TYLENOL #3) 300-30 MG tablet    Sig: TAKE ONE TABLET BY MOUTH EVERY 6 HOURS AS NEEDED FOR MODERATE PAIN . MAX OF 4 DAILY    Dispense:  120 tablet    Refill:  5   I provided  30 minutes of  face-to-face time during this encounter reviewing patient's current problems and past surgeries, labs and imaging studies, providing counseling on the above mentioned problems , and coordination  of care .  Medications Discontinued During This Encounter  Medication Reason  . butalbital-aspirin-caffeine-codeine (FIORINAL WITH CODEINE) 50-325-40-30 MG capsule   . acetaminophen-codeine (TYLENOL #3) 300-30 MG tablet Reorder    Follow-up: No follow-ups on file.   Crecencio Mc, MD

## 2019-08-19 ENCOUNTER — Telehealth: Payer: Self-pay | Admitting: Internal Medicine

## 2019-08-19 ENCOUNTER — Other Ambulatory Visit: Payer: Self-pay | Admitting: Internal Medicine

## 2019-08-19 DIAGNOSIS — M130 Polyarthritis, unspecified: Secondary | ICD-10-CM | POA: Insufficient documentation

## 2019-08-19 DIAGNOSIS — M069 Rheumatoid arthritis, unspecified: Secondary | ICD-10-CM | POA: Insufficient documentation

## 2019-08-19 DIAGNOSIS — F1120 Opioid dependence, uncomplicated: Secondary | ICD-10-CM | POA: Insufficient documentation

## 2019-08-19 LAB — COMPREHENSIVE METABOLIC PANEL
ALT: 10 U/L (ref 0–35)
AST: 15 U/L (ref 0–37)
Albumin: 3.8 g/dL (ref 3.5–5.2)
Alkaline Phosphatase: 107 U/L (ref 39–117)
BUN: 10 mg/dL (ref 6–23)
CO2: 28 mEq/L (ref 19–32)
Calcium: 9.1 mg/dL (ref 8.4–10.5)
Chloride: 96 mEq/L (ref 96–112)
Creatinine, Ser: 0.84 mg/dL (ref 0.40–1.20)
GFR: 68.5 mL/min (ref >60.00)
Glucose, Bld: 88 mg/dL (ref 70–99)
Potassium: 4.4 mEq/L (ref 3.5–5.1)
Sodium: 132 mEq/L — ABNORMAL LOW (ref 135–145)
Total Bilirubin: 0.2 mg/dL (ref 0.2–1.2)
Total Protein: 6.1 g/dL (ref 6.0–8.3)

## 2019-08-19 LAB — CBC WITH DIFFERENTIAL/PLATELET
Basophils Absolute: 0 10*3/uL (ref 0.0–0.1)
Basophils Relative: 0.7 % (ref 0.0–3.0)
Eosinophils Absolute: 0.1 10*3/uL (ref 0.0–0.7)
Eosinophils Relative: 1.1 % (ref 0.0–5.0)
HCT: 36.8 % (ref 36.0–46.0)
Hemoglobin: 12.7 g/dL (ref 12.0–15.0)
Lymphocytes Relative: 14.9 % (ref 12.0–46.0)
Lymphs Abs: 1.1 10*3/uL (ref 0.7–4.0)
MCHC: 34.5 g/dL (ref 30.0–36.0)
MCV: 88.5 fl (ref 78.0–100.0)
Monocytes Absolute: 0.5 10*3/uL (ref 0.1–1.0)
Monocytes Relative: 7.1 % (ref 3.0–12.0)
Neutro Abs: 5.6 10*3/uL (ref 1.4–7.7)
Neutrophils Relative %: 76.2 % (ref 43.0–77.0)
Platelets: 341 10*3/uL (ref 150.0–400.0)
RBC: 4.16 Mil/uL (ref 3.87–5.11)
RDW: 13.1 % (ref 11.5–15.5)
WBC: 7.4 10*3/uL (ref 4.0–10.5)

## 2019-08-19 LAB — C-REACTIVE PROTEIN: CRP: 4 mg/dL (ref 0.5–20.0)

## 2019-08-19 LAB — SEDIMENTATION RATE: Sed Rate: 5 mm/hr (ref 0–30)

## 2019-08-19 LAB — URIC ACID: Uric Acid, Serum: 3.5 mg/dL (ref 2.4–7.0)

## 2019-08-19 MED ORDER — PREDNISONE 10 MG PO TABS: ORAL_TABLET | ORAL | 0 refills | Status: DC

## 2019-08-19 MED ORDER — ACETAMINOPHEN-CODEINE #3 300-30 MG PO TABS: ORAL_TABLET | ORAL | 5 refills | Status: DC

## 2019-08-19 MED ORDER — BUTALBITAL-ASA-CAFF-CODEINE 50-325-40-30 MG PO CAPS: ORAL_CAPSULE | ORAL | 2 refills | Status: DC

## 2019-08-19 MED FILL — PANTOPRAZOLE SOD DR 40 MG T: 40 | 90 days supply | Qty: 90 | Fill #2

## 2019-08-19 NOTE — Assessment & Plan Note (Signed)
Occurring 2-3 times per week, often  weather related/triggered. sometimes waking her up at night  USING  Fiorinal and zomig . Refills given

## 2019-08-19 NOTE — Assessment & Plan Note (Signed)
Refill history confirmed via  Controlled Substance databas, accessed by me today.. she has been refilling the Fiorinal every 25 days.  I have rewritten the prescription to prevent use of more than 6 daily or #180 per month and will begin tapering monthly

## 2019-08-19 NOTE — Assessment & Plan Note (Addendum)
ddx includes SLE, Scleroderma, RA,  PMR, Hemochromatosis , gout .  Previous workup in august was normal .  ESR, CRP, uric acid,  Iron studies ordered along with plain films of tight hand given appearance to look for joint erosion.  She cannot take NSAIDs due to history of ulcer .  Will prescribe prednisone taper if ESR is elevated. . Rheumatology referral will be recommended regardless of serologic testing

## 2019-08-20 MED FILL — ONDANSETRON HCL 4 MG TABLET: 4 | 10 days supply | Qty: 30 | Fill #2

## 2019-08-23 DIAGNOSIS — Z03818 Encounter for observation for suspected exposure to other biological agents ruled out: Secondary | ICD-10-CM | POA: Diagnosis not present

## 2019-08-23 DIAGNOSIS — Z20828 Contact with and (suspected) exposure to other viral communicable diseases: Secondary | ICD-10-CM | POA: Diagnosis not present

## 2019-08-26 ENCOUNTER — Telehealth: Payer: 59 | Admitting: Internal Medicine

## 2019-08-29 ENCOUNTER — Other Ambulatory Visit: Payer: 59

## 2019-08-29 ENCOUNTER — Other Ambulatory Visit: Payer: Self-pay | Admitting: Internal Medicine

## 2019-08-29 ENCOUNTER — Ambulatory Visit (INDEPENDENT_AMBULATORY_CARE_PROVIDER_SITE_OTHER): Payer: 59

## 2019-08-29 ENCOUNTER — Other Ambulatory Visit: Payer: Self-pay

## 2019-08-29 DIAGNOSIS — M7989 Other specified soft tissue disorders: Secondary | ICD-10-CM | POA: Diagnosis not present

## 2019-08-29 DIAGNOSIS — M25541 Pain in joints of right hand: Secondary | ICD-10-CM | POA: Diagnosis not present

## 2019-08-29 DIAGNOSIS — M79641 Pain in right hand: Secondary | ICD-10-CM | POA: Diagnosis not present

## 2019-09-08 ENCOUNTER — Other Ambulatory Visit: Payer: Self-pay | Admitting: Internal Medicine

## 2019-09-08 MED FILL — ONDANSETRON HCL 4 MG TABS: 4 | 10 days supply | Qty: 30 | Fill #3

## 2019-09-08 MED FILL — DIVALPROEX SOD DR 125 MG TA: 125 | 90 days supply | Qty: 90 | Fill #2

## 2019-09-08 NOTE — Telephone Encounter (Signed)
Refill request for valium, last seen 01-28-19, last filled [phone].  Please advise.

## 2019-09-09 MED FILL — diazePAM 2 MG TABS: 2 | 30 days supply | Qty: 60 | Fill #0

## 2019-09-09 NOTE — Telephone Encounter (Signed)
INCORRECT  She was seen march 29 .Marland Kitchen

## 2019-10-02 DIAGNOSIS — Z1321 Encounter for screening for nutritional disorder: Secondary | ICD-10-CM | POA: Diagnosis not present

## 2019-10-02 DIAGNOSIS — M810 Age-related osteoporosis without current pathological fracture: Secondary | ICD-10-CM | POA: Diagnosis not present

## 2019-10-02 DIAGNOSIS — R682 Dry mouth, unspecified: Secondary | ICD-10-CM | POA: Diagnosis not present

## 2019-10-02 DIAGNOSIS — M2550 Pain in unspecified joint: Secondary | ICD-10-CM | POA: Diagnosis not present

## 2019-10-13 MED FILL — VIT D2 1.25 MG (50,000 UNIT: 1.25 MG | 84 days supply | Qty: 12 | Fill #3

## 2019-10-13 MED FILL — ONDANSETRON HCL 4 MG TABS: 4 | 10 days supply | Qty: 30 | Fill #4

## 2019-10-21 DIAGNOSIS — M2550 Pain in unspecified joint: Secondary | ICD-10-CM | POA: Diagnosis not present

## 2019-10-21 DIAGNOSIS — M810 Age-related osteoporosis without current pathological fracture: Secondary | ICD-10-CM | POA: Diagnosis not present

## 2019-10-21 DIAGNOSIS — Z30430 Encounter for insertion of intrauterine contraceptive device: Secondary | ICD-10-CM | POA: Diagnosis not present

## 2019-10-21 DIAGNOSIS — R682 Dry mouth, unspecified: Secondary | ICD-10-CM | POA: Diagnosis not present

## 2019-10-21 DIAGNOSIS — R5382 Chronic fatigue, unspecified: Secondary | ICD-10-CM | POA: Diagnosis not present

## 2019-10-30 DIAGNOSIS — M79642 Pain in left hand: Secondary | ICD-10-CM | POA: Diagnosis not present

## 2019-10-30 DIAGNOSIS — M0579 Rheumatoid arthritis with rheumatoid factor of multiple sites without organ or systems involvement: Secondary | ICD-10-CM | POA: Diagnosis not present

## 2019-10-30 DIAGNOSIS — M79671 Pain in right foot: Secondary | ICD-10-CM | POA: Diagnosis not present

## 2019-10-30 DIAGNOSIS — M79672 Pain in left foot: Secondary | ICD-10-CM | POA: Diagnosis not present

## 2019-10-30 DIAGNOSIS — M810 Age-related osteoporosis without current pathological fracture: Secondary | ICD-10-CM | POA: Diagnosis not present

## 2019-11-06 DIAGNOSIS — M810 Age-related osteoporosis without current pathological fracture: Secondary | ICD-10-CM | POA: Diagnosis not present

## 2019-11-10 MED FILL — PANTOPRAZOLE SOD DR 40 MG T: 40 | 90 days supply | Qty: 90 | Fill #3

## 2019-11-11 ENCOUNTER — Other Ambulatory Visit: Payer: Self-pay | Admitting: Internal Medicine

## 2019-11-11 MED FILL — ONDANSETRON HCL 4 MG TABS: 4 | 10 days supply | Qty: 30 | Fill #0

## 2019-11-12 ENCOUNTER — Other Ambulatory Visit: Payer: Self-pay | Admitting: Internal Medicine

## 2019-11-12 MED ORDER — ONDANSETRON HCL 4 MG PO TABS: ORAL_TABLET | ORAL | 4 refills | Status: DC

## 2019-11-20 DIAGNOSIS — M0579 Rheumatoid arthritis with rheumatoid factor of multiple sites without organ or systems involvement: Secondary | ICD-10-CM | POA: Diagnosis not present

## 2019-11-24 MED FILL — FOLIC ACID 1 MG TABS: 1 | 30 days supply | Qty: 30 | Fill #1

## 2019-11-27 ENCOUNTER — Other Ambulatory Visit (HOSPITAL_COMMUNITY): Payer: Self-pay | Admitting: Rheumatology

## 2019-11-27 DIAGNOSIS — M0579 Rheumatoid arthritis with rheumatoid factor of multiple sites without organ or systems involvement: Secondary | ICD-10-CM | POA: Diagnosis not present

## 2019-11-27 DIAGNOSIS — D809 Immunodeficiency with predominantly antibody defects, unspecified: Secondary | ICD-10-CM | POA: Diagnosis not present

## 2019-11-27 DIAGNOSIS — Z79899 Other long term (current) drug therapy: Secondary | ICD-10-CM | POA: Diagnosis not present

## 2019-11-27 DIAGNOSIS — M810 Age-related osteoporosis without current pathological fracture: Secondary | ICD-10-CM | POA: Diagnosis not present

## 2019-11-27 DIAGNOSIS — M25561 Pain in right knee: Secondary | ICD-10-CM | POA: Diagnosis not present

## 2019-12-06 ENCOUNTER — Telehealth: Payer: 59 | Admitting: Physician Assistant

## 2019-12-06 DIAGNOSIS — N390 Urinary tract infection, site not specified: Secondary | ICD-10-CM

## 2019-12-06 MED ORDER — NITROFURANTOIN MONOHYD MACRO 100 MG PO CAPS: 100.0000 mg | ORAL_CAPSULE | Freq: Two times a day (BID) | ORAL | 0 refills | Status: DC

## 2019-12-06 NOTE — Progress Notes (Signed)
We are sorry that you are not feeling well.  Here is how we plan to help!  Based on what you shared with me it looks like you most likely have a simple urinary tract infection.  A UTI (Urinary Tract Infection) is a bacterial infection of the bladder.  Most cases of urinary tract infections are simple to treat but a key part of your care is to encourage you to drink plenty of fluids and watch your symptoms carefully.  I have prescribed MacroBid 100 mg twice a day for 5 days.  Your symptoms should gradually improve. Call us if the burning in your urine worsens, you develop worsening fever, back pain or pelvic pain or if your symptoms do not resolve after completing the antibiotic.  Urinary tract infections can be prevented by drinking plenty of water to keep your body hydrated.  Also be sure when you wipe, wipe from front to back and don't hold it in!  If possible, empty your bladder every 4 hours.  Your e-visit answers were reviewed by a board certified advanced clinical practitioner to complete your personal care plan.  Depending on the condition, your plan could have included both over the counter or prescription medications.  If there is a problem please reply  once you have received a response from your provider.  Your safety is important to us.  If you have drug allergies check your prescription carefully.    You can use MyChart to ask questions about today's visit, request a non-urgent call back, or ask for a work or school excuse for 24 hours related to this e-Visit. If it has been greater than 24 hours you will need to follow up with your provider, or enter a new e-Visit to address those concerns.   You will get an e-mail in the next two days asking about your experience.  I hope that your e-visit has been valuable and will speed your recovery. Thank you for using e-visits.   Greater than 5 minutes, yet less than 10 minutes of time have been spent researching, coordinating, and  implementing care for this patient today  

## 2019-12-08 MED FILL — ONDANSETRON HCL 4 MG TABS: 4 | 20 days supply | Qty: 60 | Fill #0

## 2019-12-08 MED FILL — FOLIC ACID 1 MG TABS: 1 | 30 days supply | Qty: 60 | Fill #0

## 2019-12-19 ENCOUNTER — Other Ambulatory Visit: Payer: Self-pay | Admitting: Internal Medicine

## 2019-12-19 NOTE — Telephone Encounter (Signed)
Refill request for butalbital-aspirin-caffeine-codeine  , last seen 08-18-19, last filled 09-10-19.  Please advise.

## 2019-12-30 ENCOUNTER — Other Ambulatory Visit: Payer: Self-pay | Admitting: Internal Medicine

## 2019-12-30 ENCOUNTER — Other Ambulatory Visit: Payer: Self-pay

## 2019-12-30 ENCOUNTER — Telehealth: Payer: Self-pay | Admitting: Pharmacist

## 2019-12-30 ENCOUNTER — Ambulatory Visit (HOSPITAL_BASED_OUTPATIENT_CLINIC_OR_DEPARTMENT_OTHER): Payer: 59 | Admitting: Pharmacist

## 2019-12-30 DIAGNOSIS — Z79899 Other long term (current) drug therapy: Secondary | ICD-10-CM

## 2019-12-30 MED ORDER — HUMIRA (2 PEN) 40 MG/0.4ML ~~LOC~~ AJKT: 40.0000 mg | AUTO-INJECTOR | SUBCUTANEOUS | 1 refills | Status: DC

## 2019-12-30 NOTE — Progress Notes (Signed)
  S: Patient presents for review of their specialty medication therapy.  Patient is currently taking Humira for RA. Patient is managed by Dr. Jefm Bryant for this.   Adherence: has not yet started  Efficacy: has not yet started  Dosing:  Rheumatoid arthritis: SubQ: 40 mg every other week (may continue methotrexate, other nonbiologic DMARDS, corticosteroids, NSAIDs, and/or analgesics)  Dose adjustments: Renal: no dose adjustments (has not been studied) Hepatic: no dose adjustments (has not been studied)  Drug-drug interactions: none identified   Screening: TB test: completed per chart review  Hepatitis: completed per chart review   Monitoring: S/sx of infection: none  CBC: see Care Everywhere  S/sx of hypersensitivity: none  S/sx of malignancy: none  S/sx of heart failure: none   Other side effects: none   O:     Lab Results  Component Value Date   WBC 7.4 08/18/2019   HGB 12.7 08/18/2019   HCT 36.8 08/18/2019   MCV 88.5 08/18/2019   PLT 341.0 08/18/2019      Chemistry      Component Value Date/Time   NA 132 (L) 08/18/2019 1731   NA 138 12/08/2011 1827   K 4.4 08/18/2019 1731   K 4.2 12/08/2011 1827   CL 96 08/18/2019 1731   CL 104 12/08/2011 1827   CO2 28 08/18/2019 1731   CO2 28 12/08/2011 1827   BUN 10 08/18/2019 1731   BUN 10 12/08/2011 1827   CREATININE 0.84 08/18/2019 1731   CREATININE 0.77 03/31/2016 1544      Component Value Date/Time   CALCIUM 9.1 08/18/2019 1731   CALCIUM 8.3 (L) 12/08/2011 1827   ALKPHOS 107 08/18/2019 1731   AST 15 08/18/2019 1731   ALT 10 08/18/2019 1731   BILITOT 0.2 08/18/2019 1731       A/P: 1. Medication review: Patient currently prescribed Humira for RA. Reviewed the medication with the patient, including the following: Humira is a TNF blocking agent indicated for ankylosing spondylitis, Crohn's disease, Hidradenitis suppurativa, psoriatic arthritis, plaque psoriasis, ulcerative colitis, and uveitis. Patient  educated on purpose, proper use and potential adverse effects of Humira. Possible adverse effects are increased risk of infections, headache, and injection site reactions. There is the possibility of an increased risk of malignancy but it is not well understood if this increased risk is due to there medication or the disease state. There are rare cases of pancytopenia and aplastic anemia. For SubQ injection at separate sites in the thigh or lower abdomen (avoiding areas within 2 inches of navel); rotate injection sites. May leave at room temperature for ~15 to 30 minutes prior to use; do not remove cap or cover while allowing product to reach room temperature. Do not use if solution is discolored or contains particulate matter. Do not administer to skin which is red, tender, bruised, hard, or that has scars, stretch marks, or psoriasis plaques. Needle cap of the prefilled syringe or needle cover for the adalimumab pen may contain latex. Prefilled pens and syringes are available for use by patients and the full amount of the syringe should be injected (self-administration); the vial is intended for institutional use only. Vials do not contain a preservative; discard unused portion. No recommendations for any changes at this time.   Benard Halsted, PharmD, [place] [phone]

## 2019-12-30 NOTE — Telephone Encounter (Signed)
Called patient to schedule an appointment for the [facility]. I was unable to reach the patient so I left a HIPAA-compliant message requesting that the patient return my call.   

## 2019-12-31 ENCOUNTER — Other Ambulatory Visit: Payer: Self-pay | Admitting: Pharmacist

## 2019-12-31 MED ORDER — HUMIRA PEN 40 MG/0.8ML ~~LOC~~ PNKT: 40.0000 mg | PEN_INJECTOR | SUBCUTANEOUS | 1 refills | Status: DC

## 2019-12-31 MED FILL — ONDANSETRON HCL 4 MG TABS: 4 | 20 days supply | Qty: 60 | Fill #1

## 2019-12-31 MED FILL — HUMIRA PEN 40 MG/0.8ML PNKT: 40 | 28 days supply | Qty: 2 | Fill #0

## 2020-01-05 MED FILL — FOLIC ACID 1 MG TABS: 1 | 30 days supply | Qty: 60 | Fill #1

## 2020-01-19 DIAGNOSIS — Z79899 Other long term (current) drug therapy: Secondary | ICD-10-CM | POA: Diagnosis not present

## 2020-01-19 DIAGNOSIS — R399 Unspecified symptoms and signs involving the genitourinary system: Secondary | ICD-10-CM | POA: Diagnosis not present

## 2020-01-19 DIAGNOSIS — M0579 Rheumatoid arthritis with rheumatoid factor of multiple sites without organ or systems involvement: Secondary | ICD-10-CM | POA: Diagnosis not present

## 2020-01-27 DIAGNOSIS — M0579 Rheumatoid arthritis with rheumatoid factor of multiple sites without organ or systems involvement: Secondary | ICD-10-CM | POA: Diagnosis not present

## 2020-01-27 DIAGNOSIS — M810 Age-related osteoporosis without current pathological fracture: Secondary | ICD-10-CM | POA: Diagnosis not present

## 2020-01-27 DIAGNOSIS — R399 Unspecified symptoms and signs involving the genitourinary system: Secondary | ICD-10-CM | POA: Diagnosis not present

## 2020-02-04 MED FILL — FOLIC ACID 1 MG TABS: 1 | 30 days supply | Qty: 60 | Fill #2

## 2020-02-04 MED FILL — ONDANSETRON HCL 4 MG TABS: 4 | 20 days supply | Qty: 60 | Fill #2

## 2020-02-04 MED FILL — diazePAM 2 MG TABS: 2 | 30 days supply | Qty: 60 | Fill #1

## 2020-02-05 ENCOUNTER — Other Ambulatory Visit: Payer: Self-pay | Admitting: Internal Medicine

## 2020-02-05 DIAGNOSIS — T39095S Adverse effect of salicylates, sequela: Secondary | ICD-10-CM

## 2020-02-05 MED FILL — HUMIRA PEN 40 MG/0.8ML PNKT: 40 | 28 days supply | Qty: 2 | Fill #1

## 2020-02-19 ENCOUNTER — Other Ambulatory Visit: Payer: Self-pay

## 2020-02-19 ENCOUNTER — Ambulatory Visit (INDEPENDENT_AMBULATORY_CARE_PROVIDER_SITE_OTHER): Payer: 59 | Admitting: Dermatology

## 2020-02-19 DIAGNOSIS — D1800 Hemangioma unspecified site: Secondary | ICD-10-CM

## 2020-02-19 DIAGNOSIS — L820 Inflamed seborrheic keratosis: Secondary | ICD-10-CM | POA: Diagnosis not present

## 2020-02-19 DIAGNOSIS — L578 Other skin changes due to chronic exposure to nonionizing radiation: Secondary | ICD-10-CM

## 2020-02-19 DIAGNOSIS — L821 Other seborrheic keratosis: Secondary | ICD-10-CM

## 2020-02-19 DIAGNOSIS — D485 Neoplasm of uncertain behavior of skin: Secondary | ICD-10-CM

## 2020-02-19 NOTE — Patient Instructions (Addendum)
Wound Care Instructions  1. Cleanse wound gently with soap and water once a day then pat dry with clean gauze. Apply a thing coat of Petrolatum (petroleum jelly, "Vaseline") over the wound (unless you have an allergy to this). We recommend that you use a new, sterile tube of Vaseline. Do not pick or remove scabs. Do not remove the yellow or white "healing tissue" from the base of the wound.  2. Cover the wound with fresh, clean, nonstick gauze and secure with paper tape. You may use Band-Aids in place of gauze and tape if the would is small enough, but would recommend trimming much of the tape off as there is often too much. Sometimes Band-Aids can irritate the skin.  3. You should call the office for your biopsy report after 1 week if you have not already been contacted.  4. If you experience any problems, such as abnormal amounts of bleeding, swelling, significant bruising, significant pain, or evidence of infection, please call the office immediately.  5. FOR ADULT SURGERY PATIENTS: If you need something for pain relief you may take 1 extra strength Tylenol (acetaminophen) AND 2 Ibuprofen (200mg each) together every 4 hours as needed for pain. (do not take these if you are allergic to them or if you have a reason you should not take them.) Typically, you may only need pain medication for 1 to 3 days.   Cryotherapy Aftercare  . Wash gently with soap and water everyday.   . Apply Vaseline and Band-Aid daily until healed.  Prior to procedure, discussed risks of blister formation, small wound, skin dyspigmentation, or rare scar following cryotherapy.   

## 2020-02-19 NOTE — Progress Notes (Signed)
Follow-Up Visit   Subjective  Alexandra Lewis is a 64 y.o. female who presents for the following: Skin Problem.  Patient here today for some areas on abdomen that are irritated, one on right abdomen has bled. There is also an irritated spot at left breast.   The following portions of the chart were reviewed this encounter and updated as appropriate:  Tobacco  Allergies  Meds  Problems  Med Hx  Surg Hx  Fam Hx      Review of Systems:  No other skin or systemic complaints except as noted in HPI or Assessment and Plan.  Objective  Well appearing patient in no apparent distress; mood and affect are within normal limits.  A focused examination was performed including abdomen, arms, left breast, back. Relevant physical exam findings are noted in the Assessment and Plan.  Objective  Left Flank: 1.1cm thick brown plaque R/o ISK vs other      Objective  Left Lateral Breast: 2.1cm dark brown nodule R/o ISK vs other bx     Objective  Right Lateral Abdomen: Erythematous keratotic or waxy stuck-on papule or plaque.    Assessment & Plan  Neoplasm of uncertain behavior of skin (2) Left Flank  Epidermal / dermal shaving  Lesion diameter (cm):  1.1 Informed consent: discussed and consent obtained   Timeout: patient name, date of birth, surgical site, and procedure verified   Patient was prepped and draped in usual sterile fashion: area prepped with isopropyl alcohol. Anesthesia: the lesion was anesthetized in a standard fashion   Anesthetic:  1% lidocaine w/ epinephrine 1-100,000 buffered w/ 8.4% NaHCO3 Instrument used: flexible razor blade   Hemostasis achieved with: aluminum chloride   Outcome: patient tolerated procedure well   Post-procedure details: wound care instructions given   Additional details:  Mupirocin and a bandage applied  Specimen 1 - Surgical pathology Differential Diagnosis: R/o ISK vs other shave Check Margins: No 1.1cm thick brown  plaque   Left Lateral Breast  Skin / nail biopsy Type of biopsy: tangential   Informed consent: discussed and consent obtained   Timeout: patient name, date of birth, surgical site, and procedure verified   Patient was prepped and draped in usual sterile fashion: Area prepped with isopropyl alcohol. Anesthesia: the lesion was anesthetized in a standard fashion   Anesthetic:  1% lidocaine w/ epinephrine 1-100,000 buffered w/ 8.4% NaHCO3 Instrument used: flexible razor blade   Hemostasis achieved with: aluminum chloride   Outcome: patient tolerated procedure well   Post-procedure details: wound care instructions given   Additional details:  Mupirocin and a bandage applied  Specimen 2 - Surgical pathology Differential Diagnosis: R/o ISK vs other bx Check Margins: No 2.1cm dark brown nodule   Cryotherapy to base of both lesions  Shave removal L flank and debulking with biopsy left breast  Inflamed seborrheic keratosis Right Lateral Abdomen  Prior to procedure, discussed risks of blister formation, small wound, skin dyspigmentation, or rare scar following cryotherapy.    Destruction of lesion - Right Lateral Abdomen Complexity: simple   Destruction method: cryotherapy   Informed consent: discussed and consent obtained   Lesion destroyed using liquid nitrogen: Yes   Cryotherapy cycles:  2 Outcome: patient tolerated procedure well with no complications   Post-procedure details: wound care instructions given    Seborrheic Keratoses - Stuck-on, waxy, tan-brown papules and plaques  - Discussed benign etiology and prognosis. - Observe - Call for any changes  Actinic Damage - diffuse scaly erythematous macules  with underlying dyspigmentation - Recommend daily broad spectrum sunscreen SPF 30+ to sun-exposed areas, reapply every 2 hours as needed.  - Call for new or changing lesions. - Plan f/u FBSE  Hemangiomas - Red papules - Discussed benign nature - Observe - Call for  any changes  Return for TBSE.  Graciella Belton, RMA, am acting as scribe for Forest Gleason, MD .  Documentation: I have reviewed the above documentation for accuracy and completeness, and I agree with the above.  Forest Gleason, MD

## 2020-02-24 ENCOUNTER — Encounter: Payer: Self-pay | Admitting: Dermatology

## 2020-02-25 ENCOUNTER — Other Ambulatory Visit: Payer: Self-pay | Admitting: Internal Medicine

## 2020-02-25 ENCOUNTER — Encounter: Payer: Self-pay | Admitting: Internal Medicine

## 2020-02-25 ENCOUNTER — Other Ambulatory Visit: Payer: Self-pay

## 2020-02-25 ENCOUNTER — Ambulatory Visit (INDEPENDENT_AMBULATORY_CARE_PROVIDER_SITE_OTHER): Payer: 59 | Admitting: Internal Medicine

## 2020-02-25 VITALS — BP 102/60 | HR 82 | Temp 97.9°F | Resp 15 | Ht 64.0 in | Wt 93.0 lb

## 2020-02-25 DIAGNOSIS — E559 Vitamin D deficiency, unspecified: Secondary | ICD-10-CM

## 2020-02-25 DIAGNOSIS — F1120 Opioid dependence, uncomplicated: Secondary | ICD-10-CM | POA: Diagnosis not present

## 2020-02-25 DIAGNOSIS — M130 Polyarthritis, unspecified: Secondary | ICD-10-CM

## 2020-02-25 DIAGNOSIS — T39095S Adverse effect of salicylates, sequela: Secondary | ICD-10-CM | POA: Diagnosis not present

## 2020-02-25 MED ORDER — BUTALBITAL-ASA-CAFF-CODEINE 50-325-40-30 MG PO CAPS
ORAL_CAPSULE | ORAL | 5 refills | Status: DC
Start: 2020-02-25 — End: 2020-09-08

## 2020-02-25 MED ORDER — ACETAMINOPHEN-CODEINE #3 300-30 MG PO TABS
ORAL_TABLET | ORAL | 5 refills | Status: DC
Start: 2020-02-25 — End: 2020-08-25

## 2020-02-25 MED ORDER — PANTOPRAZOLE SODIUM 40 MG PO TBEC: 40.0000 mg | DELAYED_RELEASE_TABLET | Freq: Every day | ORAL | 3 refills | Status: DC

## 2020-02-25 MED FILL — PANTOPRAZOLE SOD DR 40 MG T: 40 | 90 days supply | Qty: 90 | Fill #0

## 2020-02-25 MED FILL — HUMIRA PEN 40 MG/0.4ML PNKT: 40 | 28 days supply | Qty: 2 | Fill #0

## 2020-02-25 NOTE — Progress Notes (Signed)
Subjective:  Patient ID: Alexandra Lewis, female    DOB: 05/31/1955  Age: 64 y.o. MRN: [id number]  CC: The primary encounter diagnosis was Vitamin D deficiency. Diagnoses of Adverse effect of salicylate, sequela, Narcotic dependency, continuous ([place]), and Polyarthritis of multiple sites were also pertinent to this visit.  HPI Alexandra Lewis presents for follow up on chronic pain managed with fioricet and tylenol with codeine  This visit occurred during the SARS-CoV-2 public health emergency.  Safety protocols were in place, including screening questions prior to the visit, additional usage of staff PPE, and extensive cleaning of exam room while observing appropriate contact time as indicated for disinfecting solutions.   Since her last visit she was been evaluated and diagnosed with rheumatoid arthritis by Precious Reel based on positive anti CCP titers and radiographs showing erosive arthritis.  She was prescribed methotrexate,  Which was not tolerated due to nausea.  Switched to Humira but had a very painful reaction,  awaiting the newer less painful one containing no citrate.   Joint pain has improved with Humira    Outpatient Medications Prior to Visit  Medication Sig Dispense Refill  . cyanocobalamin (,VITAMIN B-12,) 1000 MCG/ML injection Inject 1 mL (1,000 mcg total) into the skin every 14 (fourteen) days. 10 mL 5  . diazepam (VALIUM) 2 MG tablet TAKE 1 TABLET BY MOUTH EVERY 12 HOURS AS NEEDED FOR ANXIETY 60 tablet 5  . Famotidine (PEPCID PO) Take by mouth.    Marland Kitchen HUMIRA PEN 40 MG/0.4ML PNKT Inject 40 mg into the muscle every 14 (fourteen) days.     . INS SYRINGE/NEEDLE 1CC/28G (B-D INSULIN SYRINGE 1CC/28G) 28G X 1/2" 1 ML MISC For use with b12 injections 10 each 3  . ondansetron (ZOFRAN) 4 MG tablet TAKE 1 TABLET BY MOUTH EVERY 8 HOURS AS NEEDED FOR NAUSEA OR VOMITING 60 tablet 4  . ZOLMitriptan (ZOMIG) 2.5 MG tablet 1/2 to 1 tablet as needed for migraine headaches 8 tablet 5    . acetaminophen-codeine (TYLENOL #3) 300-30 MG tablet TAKE ONE TABLET BY MOUTH EVERY 6 HOURS AS NEEDED FOR MODERATE PAIN . MAX OF 4 DAILY 120 tablet 5  . butalbital-aspirin-caffeine-codeine (FIORINAL WITH CODEINE) 50-325-40-30 MG capsule TAKE TWO CAPSULES BY MOUTH EVERY 6 HOURS AS NEEDED FOR PAIN . MAX OF 6 CAPSULES DAILY 180 capsule 2  . pantoprazole (PROTONIX) 40 MG tablet TAKE 1 TABLET BY MOUTH ONCE DAILY 90 tablet 3  . Vitamin D, Ergocalciferol, (DRISDOL) 1.25 MG (50000 UT) CAPS capsule TAKE 1 CAPSULE BY MOUTH ONCE A WEEK 12 capsule 3  . folic acid (FOLVITE) 1 MG tablet Take 2 mg by mouth daily.    . Adalimumab (HUMIRA PEN) 40 MG/0.8ML PNKT Inject 40 mg into the skin every 14 (fourteen) days. (Patient not taking: Reported on 02/25/2020) 6 each 1  . divalproex (DEPAKOTE) 125 MG DR tablet TAKE 1 TABLET BY MOUTH ONCE A DAY (Patient not taking: Reported on 02/25/2020) 90 tablet 2  . nitrofurantoin, macrocrystal-monohydrate, (MACROBID) 100 MG capsule Take 1 capsule (100 mg total) by mouth 2 (two) times daily. 10 capsule 0  . predniSONE (DELTASONE) 10 MG tablet 6 tablets daily for 3 days, then reduce by 1 tablet daily until gone 33 tablet 0  . ZOLMitriptan (ZOMIG) 2.5 MG tablet 1/2 to 1 tablet as needed for migraine headaches 8 tablet 5   No facility-administered medications prior to visit.    Review of Systems;  Patient denies headache, fevers, malaise, unintentional weight loss, skin rash,  eye pain, sinus congestion and sinus pain, sore throat, dysphagia,  hemoptysis , cough, dyspnea, wheezing, chest pain, palpitations, orthopnea, edema, abdominal pain, nausea, melena, diarrhea, constipation, flank pain, dysuria, hematuria, urinary  Frequency, nocturia, numbness, tingling, seizures,  Focal weakness, Loss of consciousness,  Tremor, insomnia, depression, anxiety, and suicidal ideation.      Objective:  BP 102/60 (BP Location: Left Arm, Patient Position: Sitting, Cuff Size: Normal)   Pulse 82    Temp 97.9 F (36.6 C) (Oral)   Resp 15   Ht 5' 4"  (1.626 m)   Wt 93 lb (42.2 kg)   SpO2 96%   BMI 15.96 kg/m   BP Readings from Last 3 Encounters:  02/25/20 102/60  08/18/19 130/74  01/07/19 90/62    Wt Readings from Last 3 Encounters:  02/25/20 93 lb (42.2 kg)  08/18/19 90 lb 12.8 oz (41.2 kg)  01/07/19 89 lb 6.4 oz (40.6 kg)    General appearance: alert, cooperative and appears stated age Ears: normal TM's and external ear canals both ears Throat: lips, mucosa, and tongue normal; teeth and gums normal Neck: no adenopathy, no carotid bruit, supple, symmetrical, trachea midline and thyroid not enlarged, symmetric, no tenderness/mass/nodules Back: symmetric, no curvature. ROM normal. No CVA tenderness. Lungs: clear to auscultation bilaterally Heart: regular rate and rhythm, S1, S2 normal, no murmur, click, rub or gallop Abdomen: soft, non-tender; bowel sounds normal; no masses,  no organomegaly Pulses: 2+ and symmetric Skin: Skin color, texture, turgor normal. No rashes or lesions Lymph nodes: Cervical, supraclavicular, and axillary nodes normal.  No results found for: HGBA1C  Lab Results  Component Value Date   CREATININE 0.97 02/25/2020   CREATININE 0.84 08/18/2019   CREATININE 0.97 01/07/2019    Lab Results  Component Value Date   WBC 5.1 02/25/2020   HGB 12.3 02/25/2020   HCT 37.2 02/25/2020   PLT 283.0 02/25/2020   GLUCOSE 49 Repeated and verified X2. (LL) 02/25/2020   CHOL 252 (H) 12/29/2015   TRIG 79.0 12/29/2015   HDL 104.30 12/29/2015   LDLDIRECT 112.0 12/29/2015   LDLCALC 132 (H) 12/29/2015   ALT 10 02/25/2020   AST 14 02/25/2020   NA 135 02/25/2020   K 4.2 02/25/2020   CL 100 02/25/2020   CREATININE 0.97 02/25/2020   BUN 19 02/25/2020   CO2 28 02/25/2020   TSH 1.32 03/27/2018     Assessment & Plan:   Problem List Items Addressed This Visit      Unprioritized   Adverse effect of salicylate- ulcer   Relevant Medications   pantoprazole  (PROTONIX) 40 MG tablet   Other Relevant Orders   Comprehensive metabolic panel (Completed)   CBC with Differential/Platelet (Completed)   Narcotic dependency, continuous ([place])    Refill history confirmed via [place] Controlled Substance databas, accessed by me today..       Polyarthritis of multiple sites    She was initially ruled out for active inflammatory arthritis due to normal ESR/CRP,  But  Due to persistent pain she was reevaluated via second opinion from Dr Jefm Bryant and diagnosed with RA.. now receiving treatment and pain is improving.        Other Visit Diagnoses    Vitamin D deficiency    -  Primary   Relevant Orders   VITAMIN D 25 Hydroxy (Vit-D Deficiency, Fractures) (Completed)      I have discontinued Alexandra Lewis "Chris"'s divalproex, predniSONE, and nitrofurantoin (macrocrystal-monohydrate). I have also changed her pantoprazole. Additionally, I am having  her maintain her INS SYRINGE/NEEDLE 1CC/28G, cyanocobalamin, ZOLMitriptan, Famotidine (PEPCID PO), diazepam, ondansetron, Humira Pen, folic acid, butalbital-aspirin-caffeine-codeine, and acetaminophen-codeine.  Meds ordered this encounter  Medications  . pantoprazole (PROTONIX) 40 MG tablet    Sig: Take 1 tablet (40 mg total) by mouth daily.    Dispense:  90 tablet    Refill:  3  . butalbital-aspirin-caffeine-codeine (FIORINAL WITH CODEINE) 50-325-40-30 MG capsule    Sig: TAKE TWO CAPSULES BY MOUTH EVERY 6 HOURS AS NEEDED FOR PAIN . MAX OF 6 CAPSULES DAILY    Dispense:  180 capsule    Refill:  5  . acetaminophen-codeine (TYLENOL #3) 300-30 MG tablet    Sig: TAKE ONE TABLET BY MOUTH EVERY 6 HOURS AS NEEDED FOR MODERATE PAIN . MAX OF 4 DAILY    Dispense:  120 tablet    Refill:  5    Medications Discontinued During This Encounter  Medication Reason  . ZOLMitriptan (ZOMIG) 2.5 MG tablet Error  . predniSONE (DELTASONE) 10 MG tablet Error  . nitrofurantoin, macrocrystal-monohydrate, (MACROBID) 100 MG capsule  Error  . Adalimumab (HUMIRA PEN) 40 MG/0.8ML PNKT Change in therapy  . divalproex (DEPAKOTE) 125 MG DR tablet   . pantoprazole (PROTONIX) 40 MG tablet Reorder  . acetaminophen-codeine (TYLENOL #3) 300-30 MG tablet Reorder  . butalbital-aspirin-caffeine-codeine (FIORINAL WITH CODEINE) 50-325-40-30 MG capsule Reorder    Follow-up: No follow-ups on file.   Crecencio Mc, MD

## 2020-02-25 NOTE — Progress Notes (Signed)
1. Skin (M), left flank SEBORRHEIC KERATOSIS, IRRITATED  2. Skin (M), left lateral breast SEBORRHEIC KERATOSIS, IRRITATED  These are benign growths or "wisdom spots". No additional treatment is needed unless they are bothersome. If the continue to be irritated or give you trouble, we can treat with liquid nitrogen freezing in clinic.

## 2020-02-26 ENCOUNTER — Other Ambulatory Visit: Payer: Self-pay | Admitting: Internal Medicine

## 2020-02-26 LAB — COMPREHENSIVE METABOLIC PANEL
ALT: 10 U/L (ref 0–35)
AST: 14 U/L (ref 0–37)
Albumin: 4.1 g/dL (ref 3.5–5.2)
Alkaline Phosphatase: 119 U/L — ABNORMAL HIGH (ref 39–117)
BUN: 19 mg/dL (ref 6–23)
CO2: 28 mEq/L (ref 19–32)
Calcium: 9.1 mg/dL (ref 8.4–10.5)
Chloride: 100 mEq/L (ref 96–112)
Creatinine, Ser: 0.97 mg/dL (ref 0.40–1.20)
GFR: 61.97 mL/min (ref >60.00)
Glucose, Bld: 49 mg/dL — CL (ref 70–99)
Potassium: 4.2 mEq/L (ref 3.5–5.1)
Sodium: 135 mEq/L (ref 135–145)
Total Bilirubin: 0.3 mg/dL (ref 0.2–1.2)
Total Protein: 6.3 g/dL (ref 6.0–8.3)

## 2020-02-26 LAB — CBC WITH DIFFERENTIAL/PLATELET
Basophils Absolute: 0 10*3/uL (ref 0.0–0.1)
Basophils Relative: 0.8 % (ref 0.0–3.0)
Eosinophils Absolute: 0.1 10*3/uL (ref 0.0–0.7)
Eosinophils Relative: 1.9 % (ref 0.0–5.0)
HCT: 37.2 % (ref 36.0–46.0)
Hemoglobin: 12.3 g/dL (ref 12.0–15.0)
Lymphocytes Relative: 29.9 % (ref 12.0–46.0)
Lymphs Abs: 1.5 10*3/uL (ref 0.7–4.0)
MCHC: 33.1 g/dL (ref 30.0–36.0)
MCV: 91.8 fl (ref 78.0–100.0)
Monocytes Absolute: 0.7 10*3/uL (ref 0.1–1.0)
Monocytes Relative: 13.7 % — ABNORMAL HIGH (ref 3.0–12.0)
Neutro Abs: 2.7 10*3/uL (ref 1.4–7.7)
Neutrophils Relative %: 53.7 % (ref 43.0–77.0)
Platelets: 283 10*3/uL (ref 150.0–400.0)
RBC: 4.05 Mil/uL (ref 3.87–5.11)
RDW: 12.2 % (ref 11.5–15.5)
WBC: 5.1 10*3/uL (ref 4.0–10.5)

## 2020-02-26 LAB — VITAMIN D 25 HYDROXY (VIT D DEFICIENCY, FRACTURES): VITD: 51.48 ng/mL (ref 30.00–100.00)

## 2020-02-26 MED FILL — VIT D2 1.25 MG (50,000 UNIT: 1.25 MG | 84 days supply | Qty: 12 | Fill #0

## 2020-02-26 NOTE — Assessment & Plan Note (Signed)
Refill history confirmed via [place] Controlled Substance databas, accessed by me today...   

## 2020-02-26 NOTE — Progress Notes (Signed)
Your vitamin D, CBC , liver and kidney function are normal.  You do not need any medication changes. The low blood glucose is often exaggerated due to sampling,  but may suggest that you are not eating often enough . Please continue your current medications and plan to repeat the labs in 3 months.    Regards,   Dr. Derrel Nip

## 2020-02-26 NOTE — Assessment & Plan Note (Signed)
She was initially ruled out for active inflammatory arthritis due to normal ESR/CRP,  But  Due to persistent pain she was reevaluated via second opinion from Dr Jefm Bryant and diagnosed with RA.. now receiving treatment and pain is improving.

## 2020-03-01 ENCOUNTER — Other Ambulatory Visit: Payer: Self-pay

## 2020-03-01 ENCOUNTER — Telehealth: Payer: Self-pay

## 2020-03-01 ENCOUNTER — Other Ambulatory Visit (INDEPENDENT_AMBULATORY_CARE_PROVIDER_SITE_OTHER): Payer: 59

## 2020-03-01 DIAGNOSIS — R300 Dysuria: Secondary | ICD-10-CM

## 2020-03-01 LAB — URINALYSIS, ROUTINE W REFLEX MICROSCOPIC
Bilirubin Urine: NEGATIVE
Ketones, ur: NEGATIVE
Leukocytes,Ua: NEGATIVE
Nitrite: NEGATIVE
Specific Gravity, Urine: 1.01 (ref 1.000–1.030)
Total Protein, Urine: NEGATIVE
Urine Glucose: NEGATIVE
Urobilinogen, UA: 0.2 (ref 0.0–1.0)
WBC, UA: NONE SEEN (ref 0–?)
pH: 7 (ref 5.0–8.0)

## 2020-03-01 MED FILL — FOLIC ACID 1 MG TABS: 1 | 30 days supply | Qty: 60 | Fill #3

## 2020-03-01 NOTE — Telephone Encounter (Signed)
Pt's husband called and stated that his wife was given a urine cup to bring in when they thought she might have a UTI. Husband is on his way to drop off urine sample. Lab orders have been placed. Would you like for me to schedule pt for a virtual to follow up on results?

## 2020-03-01 NOTE — Telephone Encounter (Signed)
Orders have been placed.

## 2020-03-01 NOTE — Telephone Encounter (Signed)
Yes

## 2020-03-01 NOTE — Telephone Encounter (Signed)
Pt's husband states that pt needs a UA and culture

## 2020-03-02 ENCOUNTER — Ambulatory Visit: Payer: 59 | Admitting: Internal Medicine

## 2020-03-02 ENCOUNTER — Telehealth: Payer: Self-pay

## 2020-03-02 DIAGNOSIS — R300 Dysuria: Secondary | ICD-10-CM

## 2020-03-02 LAB — URINE CULTURE
MICRO NUMBER:: 11055635
SPECIMEN QUALITY:: ADEQUATE

## 2020-03-02 NOTE — Telephone Encounter (Signed)
Dr Derrel Nip I have significant pain and burning when I urinate. I also have back pain and lower abdominal pain and intermittent fever. Can you call a prescription in to [facility] until culture is returned? Valla Leaver

## 2020-03-02 NOTE — Telephone Encounter (Signed)
Spoke with pt's husband and rescheduled appt for tomorrow at 2:30. He stated pt was up all night in pain so he thought that tomorrow would be better.

## 2020-03-02 NOTE — Telephone Encounter (Signed)
Spoke with pt's husband and changed appt to in office tomorrow and he stated that if he could get by here in time today he would to pick up a sample cup.

## 2020-03-02 NOTE — Addendum Note (Signed)
Addended by: Adair Laundry on: 03/02/2020 03:00 PM   Modules accepted: Orders

## 2020-03-03 ENCOUNTER — Ambulatory Visit: Payer: 59 | Admitting: Internal Medicine

## 2020-03-03 NOTE — Progress Notes (Signed)
There is no evidence of UTI by culture results. If you are having symptoms of blood in your urine or pain in your bladder , please let me know, because I would  recommend  seeing a urologist to have the lining of your bladder examined with cystoscopy. I will make the referral for you to see Dr Ashley Brandon ( or another urologist if you have a preference). Just let me know.   If your symptoms are limited to burning, please make an appointment with me or your gynecologist for a pelvic exam, because this may be due to atrophic or other forms of vaginitis .  Regards,   Kesler Wickham, MD    

## 2020-03-14 ENCOUNTER — Encounter: Payer: Self-pay | Admitting: Dermatology

## 2020-03-26 MED FILL — HUMIRA PEN 40 MG/0.4ML PNKT: 40 | 28 days supply | Qty: 2 | Fill #1

## 2020-04-04 MED FILL — FOLIC ACID 1 MG TABS: 1 | 30 days supply | Qty: 60 | Fill #4

## 2020-04-09 ENCOUNTER — Other Ambulatory Visit: Payer: Self-pay | Admitting: Internal Medicine

## 2020-04-09 NOTE — Telephone Encounter (Signed)
Medication was discontinued on 02/25/2020. Patient is requesting a refill, Ok to refill?

## 2020-04-20 ENCOUNTER — Telehealth: Payer: Self-pay

## 2020-04-20 MED ORDER — DEPAKOTE 125 MG PO TBEC
125.0000 mg | DELAYED_RELEASE_TABLET | Freq: Every day | ORAL | 2 refills | Status: DC
Start: 2020-04-20 — End: 2021-01-13

## 2020-04-20 NOTE — Telephone Encounter (Signed)
Pt's husband called wanting to see about getting refill on pt's Depakote 125 mg delayed release. He stated that the pt was trying to wean herself off of it but she has realized that she is not going to be able to do that. Medication has been discontinued from chart. Is it okay to refill?

## 2020-04-20 NOTE — Telephone Encounter (Signed)
depakoate ER refilled

## 2020-04-20 NOTE — Telephone Encounter (Signed)
Patients husband aware that refill was sent. He stated that pharmacy may be contracting to ask if generic was okay to fill. He stated that it was. I told him they had not contacted [place], but when we did we could give the verbal okay.

## 2020-04-20 NOTE — Addendum Note (Signed)
Addended by: Crecencio Mc on: 04/20/2020 04:18 PM   Modules accepted: Orders

## 2020-04-27 ENCOUNTER — Telehealth: Payer: Self-pay

## 2020-04-27 NOTE — Telephone Encounter (Signed)
Patient is very resistant to change  In medications due to multiple intolerances and past drug failures.  She is aware of the risks and wants to continue this medication for treatment of migraines

## 2020-04-27 NOTE — Telephone Encounter (Signed)
Alonza Bogus, care coordinator, called to let us know he does not think pt is a candidate for Zomig due to her having a history of CVA. Needing to know if pt is okay to take the medication or if she needs to be put on something different.   [phone]

## 2020-04-28 NOTE — Telephone Encounter (Signed)
Spoke with Alonza Bogus and let him know that pt is very resistant to medication changes due to intolerances and that she is aware of the risk and would like to continue the medication. He gave a verbal understanding.

## 2020-05-02 MED FILL — FOLIC ACID 1 MG TABS: 1 | 30 days supply | Qty: 60 | Fill #5

## 2020-05-03 DIAGNOSIS — M0579 Rheumatoid arthritis with rheumatoid factor of multiple sites without organ or systems involvement: Secondary | ICD-10-CM | POA: Diagnosis not present

## 2020-05-03 DIAGNOSIS — M810 Age-related osteoporosis without current pathological fracture: Secondary | ICD-10-CM | POA: Diagnosis not present

## 2020-05-03 DIAGNOSIS — R399 Unspecified symptoms and signs involving the genitourinary system: Secondary | ICD-10-CM | POA: Diagnosis not present

## 2020-05-06 ENCOUNTER — Other Ambulatory Visit (HOSPITAL_COMMUNITY): Payer: Self-pay | Admitting: Urology

## 2020-05-19 MED FILL — VIT D2 1.25 MG (50,000 UNIT: 1.25 MG | 84 days supply | Qty: 12 | Fill #1

## 2020-05-19 MED FILL — PANTOPRAZOLE SOD DR 40 MG T: 40 | 90 days supply | Qty: 90 | Fill #1

## 2020-06-02 ENCOUNTER — Ambulatory Visit: Payer: 59 | Admitting: Dermatology

## 2020-06-25 MED FILL — MYRBETRIQ ER 25 MG TABLET: 25 | 30 days supply | Qty: 30 | Fill #0

## 2020-07-12 ENCOUNTER — Other Ambulatory Visit: Payer: Self-pay | Admitting: Internal Medicine

## 2020-07-12 DIAGNOSIS — E538 Deficiency of other specified B group vitamins: Secondary | ICD-10-CM

## 2020-07-12 MED FILL — CYANOCOBALAMIN 1,000 MCG/ML: 1000 | 84 days supply | Qty: 6 | Fill #0

## 2020-07-21 MED FILL — MYRBETRIQ ER 25 MG TABLET: 25 | 30 days supply | Qty: 30 | Fill #1

## 2020-08-17 ENCOUNTER — Other Ambulatory Visit (HOSPITAL_COMMUNITY): Payer: Self-pay

## 2020-08-17 MED FILL — MYRBETRIQ ER 25 MG TABLET: 25 | 30 days supply | Qty: 30 | Fill #2

## 2020-08-17 MED FILL — PANTOPRAZOLE SOD DR 40 MG T: 40 | 90 days supply | Qty: 90 | Fill #2

## 2020-08-17 MED FILL — VIT D2 1.25 MG (50,000 UNIT: 1.25 MG | 84 days supply | Qty: 12 | Fill #2

## 2020-08-18 ENCOUNTER — Other Ambulatory Visit: Payer: Self-pay | Admitting: Internal Medicine

## 2020-08-19 ENCOUNTER — Other Ambulatory Visit: Payer: Self-pay | Admitting: Internal Medicine

## 2020-08-19 MED FILL — diazePAM 2 MG TABS: 2 | 30 days supply | Qty: 60 | Fill #0

## 2020-08-24 ENCOUNTER — Other Ambulatory Visit: Payer: Self-pay | Admitting: Internal Medicine

## 2020-08-26 ENCOUNTER — Other Ambulatory Visit (HOSPITAL_COMMUNITY): Payer: Self-pay

## 2020-09-01 ENCOUNTER — Other Ambulatory Visit (HOSPITAL_COMMUNITY): Payer: Self-pay

## 2020-09-07 ENCOUNTER — Other Ambulatory Visit: Payer: Self-pay | Admitting: Internal Medicine

## 2020-09-08 ENCOUNTER — Ambulatory Visit: Payer: 59 | Admitting: Dermatology

## 2020-09-08 NOTE — Telephone Encounter (Signed)
RX Refill:FIORINAL WITH CODEINE Last Seen:02-25-20 Last ordered:02-25-20

## 2020-09-22 DIAGNOSIS — M810 Age-related osteoporosis without current pathological fracture: Secondary | ICD-10-CM | POA: Diagnosis not present

## 2020-09-22 DIAGNOSIS — Z8781 Personal history of (healed) traumatic fracture: Secondary | ICD-10-CM | POA: Diagnosis not present

## 2020-09-22 DIAGNOSIS — R636 Underweight: Secondary | ICD-10-CM | POA: Diagnosis not present

## 2020-09-22 DIAGNOSIS — Z7952 Long term (current) use of systemic steroids: Secondary | ICD-10-CM | POA: Diagnosis not present

## 2020-09-22 DIAGNOSIS — M816 Localized osteoporosis [Lequesne]: Secondary | ICD-10-CM | POA: Diagnosis not present

## 2020-09-23 ENCOUNTER — Other Ambulatory Visit (HOSPITAL_COMMUNITY): Payer: Self-pay

## 2020-09-25 ENCOUNTER — Other Ambulatory Visit (HOSPITAL_COMMUNITY): Payer: Self-pay

## 2020-09-25 MED ORDER — EVENITY 105 MG/1.17ML ~~LOC~~ SOSY
PREFILLED_SYRINGE | SUBCUTANEOUS | 3 refills | Status: DC
  Filled 2020-09-25: qty 7.02, 28d supply, fill #0

## 2020-09-28 ENCOUNTER — Other Ambulatory Visit (HOSPITAL_COMMUNITY): Payer: Self-pay

## 2020-09-28 DIAGNOSIS — M0579 Rheumatoid arthritis with rheumatoid factor of multiple sites without organ or systems involvement: Secondary | ICD-10-CM | POA: Diagnosis not present

## 2020-09-28 DIAGNOSIS — M810 Age-related osteoporosis without current pathological fracture: Secondary | ICD-10-CM | POA: Diagnosis not present

## 2020-09-29 ENCOUNTER — Other Ambulatory Visit (HOSPITAL_COMMUNITY): Payer: Self-pay

## 2020-09-29 MED ORDER — "TUBERCULIN SYRINGE 27G X 1/2"" 1 ML MISC"
1 refills | Status: AC
  Filled 2020-09-29: qty 10, 70d supply, fill #0
  Filled 2020-12-31: qty 10, 70d supply, fill #1

## 2020-09-29 MED ORDER — METHOTREXATE SODIUM CHEMO INJECTION 50 MG/2ML
INTRAMUSCULAR | 1 refills | Status: DC
  Filled 2020-09-29: qty 4, 70d supply, fill #0
  Filled 2021-04-02: qty 4, 70d supply, fill #1

## 2020-10-01 ENCOUNTER — Other Ambulatory Visit: Payer: Self-pay

## 2020-10-01 ENCOUNTER — Ambulatory Visit: Payer: 59 | Attending: Internal Medicine | Admitting: Pharmacist

## 2020-10-01 ENCOUNTER — Other Ambulatory Visit (HOSPITAL_COMMUNITY): Payer: Self-pay

## 2020-10-01 DIAGNOSIS — Z79899 Other long term (current) drug therapy: Secondary | ICD-10-CM

## 2020-10-01 MED ORDER — ERGOCALCIFEROL 1.25 MG (50000 UT) PO CAPS
ORAL_CAPSULE | ORAL | 5 refills | Status: DC
  Filled 2020-10-01 – 2021-04-02 (×3): qty 8, 28d supply, fill #0
  Filled 2021-05-10: qty 8, 28d supply, fill #1
  Filled 2021-05-26: qty 16, 56d supply, fill #2

## 2020-10-01 NOTE — Progress Notes (Signed)
   S: S: Patient presents today for review of their specialty medication.   Patient is currently prescribed Evenity for osteoporosis. Patient is managed by Dr. Doren Custard this.   Dosing: 210 mg once monthly x12 months  Adherence: has not yet started  Efficacy: has not yet started  Monitoring:  -S/Sx of hypersensitivity: none -S/Sx of cardiovascular events: none -Calcium levels: WNL 09/22/2020 - BMD: last performed 10/2019;   Current adverse effects: - has not yet started   O:     Lab Results  Component Value Date   WBC 5.1 02/25/2020   HGB 12.3 02/25/2020   HCT 37.2 02/25/2020   MCV 91.8 02/25/2020   PLT 283.0 02/25/2020      Chemistry      Component Value Date/Time   NA 135 02/25/2020 1651   NA 138 12/08/2011 1827   K 4.2 02/25/2020 1651   K 4.2 12/08/2011 1827   CL 100 02/25/2020 1651   CL 104 12/08/2011 1827   CO2 28 02/25/2020 1651   CO2 28 12/08/2011 1827   BUN 19 02/25/2020 1651   BUN 10 12/08/2011 1827   CREATININE 0.97 02/25/2020 1651   CREATININE 0.77 03/31/2016 1544      Component Value Date/Time   CALCIUM 9.1 02/25/2020 1651   CALCIUM 8.3 (L) 12/08/2011 1827   ALKPHOS 119 (H) 02/25/2020 1651   AST 14 02/25/2020 1651   ALT 10 02/25/2020 1651   BILITOT 0.3 02/25/2020 1651       A/P: 1. Medication review: patient currently prescribed Evenity for osteoporosis. Covered the medication with that patient's husband. Reviewed the medication with the patient's husband. We discussed potential risks and AEs as well as role in therapy. No recommendation for any changes at this time.   Benard Halsted, PharmD, [place], [place] [phone]

## 2020-10-06 ENCOUNTER — Other Ambulatory Visit: Payer: Self-pay | Admitting: Internal Medicine

## 2020-10-06 NOTE — Telephone Encounter (Signed)
RX Refill:FIORINAL WITH CODEINE Last Seen:02-25-20 Last ordered:09-08-20

## 2020-10-08 ENCOUNTER — Other Ambulatory Visit (HOSPITAL_COMMUNITY): Payer: Self-pay

## 2020-10-08 ENCOUNTER — Telehealth: Payer: Self-pay

## 2020-10-08 NOTE — Telephone Encounter (Signed)
[employer] sent over a Prior Authorization for Evenity 105MG /1.17ML SYRINGES. This medication was prescribed by [facility], CPhT.  KEY - [id number]

## 2020-10-09 ENCOUNTER — Other Ambulatory Visit (HOSPITAL_COMMUNITY): Payer: Self-pay

## 2020-10-09 MED ORDER — ENBREL SURECLICK 50 MG/ML ~~LOC~~ SOAJ
50.0000 mg | SUBCUTANEOUS | 12 refills | Status: DC
  Filled 2020-10-09 – 2021-08-18 (×4): qty 4, 28d supply, fill #0

## 2020-10-10 ENCOUNTER — Other Ambulatory Visit (HOSPITAL_COMMUNITY): Payer: Self-pay

## 2020-10-11 ENCOUNTER — Other Ambulatory Visit: Payer: Self-pay | Admitting: Pharmacist

## 2020-10-11 ENCOUNTER — Other Ambulatory Visit: Payer: Self-pay

## 2020-10-11 ENCOUNTER — Ambulatory Visit: Payer: 59 | Attending: Family Medicine | Admitting: Pharmacist

## 2020-10-11 ENCOUNTER — Other Ambulatory Visit (HOSPITAL_COMMUNITY): Payer: Self-pay

## 2020-10-11 DIAGNOSIS — Z79899 Other long term (current) drug therapy: Secondary | ICD-10-CM

## 2020-10-11 MED ORDER — ENBREL SURECLICK 50 MG/ML ~~LOC~~ SOAJ
50.0000 mg | SUBCUTANEOUS | 12 refills | Status: DC
  Filled 2020-10-11 – 2020-10-12 (×3): qty 4, 28d supply, fill #0
  Filled 2020-12-07: qty 4, 28d supply, fill #1
  Filled 2021-01-13: qty 4, 28d supply, fill #2
  Filled 2021-03-10: qty 4, 28d supply, fill #3
  Filled 2021-04-07 (×3): qty 4, 28d supply, fill #4
  Filled 2021-04-28 – 2021-05-06 (×3): qty 4, 28d supply, fill #5
  Filled 2021-05-30: qty 4, 28d supply, fill #6

## 2020-10-11 MED ORDER — EVENITY 105 MG/1.17ML ~~LOC~~ SOSY
PREFILLED_SYRINGE | SUBCUTANEOUS | 3 refills | Status: DC
  Filled 2020-10-11: qty 2.34, 28d supply, fill #0
  Filled 2020-10-11: qty 7.02, 84d supply, fill #0
  Filled 2020-10-12: qty 2.34, 28d supply, fill #0
  Filled 2020-11-10: qty 2.34, 28d supply, fill #1
  Filled 2020-12-06: qty 2.34, 28d supply, fill #2
  Filled 2020-12-29: qty 2.34, 28d supply, fill #3
  Filled 2021-02-10: qty 2.34, 28d supply, fill #4
  Filled 2021-03-10: qty 2.34, 28d supply, fill #5

## 2020-10-11 NOTE — Progress Notes (Signed)
   S: Patient presents for review of their specialty medication therapy.  Patient is currently prescribed Enbrel for RA. Patient is managed by Dr. Jefm Bryant for this.   Adherence: has not yet started  Efficacy: has not yet started   Dosing:   Ankylosing spondylitis, psoriatic arthritis, rheumatoid arthritis: SubQ: Note: May continue methotrexate, glucocorticoids, salicylates, NSAIDs, or analgesics during etanercept therapy. Once-weekly dosing: 50 mg once weekly; maximum dose (rheumatoid arthritis): 50 mg/week. Twice-weekly dosing (off-label dose): 25 mg twice weekly (Bathon 2000; Calin 2004; Rosana Hoes 2003; Genovese 2002; Mease 2000; Mease 2002)  Screening: TB test: completed Hepatitis B: completed   Monitoring: Injection site reactions: has not yet started S/sx of infections:  has not yet started S/sx of malignancy:  has not yet started GI upset:  has not yet started    O:     Lab Results  Component Value Date   WBC 5.1 02/25/2020   HGB 12.3 02/25/2020   HCT 37.2 02/25/2020   MCV 91.8 02/25/2020   PLT 283.0 02/25/2020      Chemistry      Component Value Date/Time   NA 135 02/25/2020 1651   NA 138 12/08/2011 1827   K 4.2 02/25/2020 1651   K 4.2 12/08/2011 1827   CL 100 02/25/2020 1651   CL 104 12/08/2011 1827   CO2 28 02/25/2020 1651   CO2 28 12/08/2011 1827   BUN 19 02/25/2020 1651   BUN 10 12/08/2011 1827   CREATININE 0.97 02/25/2020 1651   CREATININE 0.77 03/31/2016 1544      Component Value Date/Time   CALCIUM 9.1 02/25/2020 1651   CALCIUM 8.3 (L) 12/08/2011 1827   ALKPHOS 119 (H) 02/25/2020 1651   AST 14 02/25/2020 1651   ALT 10 02/25/2020 1651   BILITOT 0.3 02/25/2020 1651       A/P: 1. Medication review: patient is about to start Enbrel for RA. Reviewed the medication with the patient, including the following: Enbrel (etanercept) binds tumor necrosis factor (TNF) and blocks its interaction with cell surface receptors. TNF plays an important role  in the inflammatory processes of many diseases. Patient educated on purpose, proper use and potential adverse effects of Enbrel. SubQ: Administer subcutaneously. Rotate injection sites; may inject into the thigh (preferred), abdomen (avoiding the 2-inch area around the navel), or outer areas of upper arm. New injections should be given at least 1 inch from an old site and never into areas where the skin is tender, bruised, red, or hard; any raised thick, red, or scaly skin patches or lesions; or areas with scars or stretch marks. For a more comfortable injection, allow autoinjectors, prefilled syringes, and dose trays to reach room temperature for 15 to 30 minutes (?30 minutes for autoinjector) prior to injection; do not remove the needle cover while allowing product to reach room temperature. There may be small white particles of protein in the solution; this is not unusual for proteinaceous solutions. Possible adverse effects include rash, GI upset, increased risk of infection, and injection site reactions. Patients should stop Enbrel if they develop a serious infection. There is a possible increased risk in lymphoma and other malignancies. No recommendations for any changes.    Benard Halsted, PharmD, [facility] [place], [place] [phone]

## 2020-10-12 ENCOUNTER — Other Ambulatory Visit (HOSPITAL_COMMUNITY): Payer: Self-pay

## 2020-10-13 ENCOUNTER — Other Ambulatory Visit (HOSPITAL_COMMUNITY): Payer: Self-pay

## 2020-10-13 ENCOUNTER — Ambulatory Visit: Payer: 59 | Admitting: Dermatology

## 2020-10-13 DIAGNOSIS — R399 Unspecified symptoms and signs involving the genitourinary system: Secondary | ICD-10-CM | POA: Diagnosis not present

## 2020-10-15 ENCOUNTER — Other Ambulatory Visit (HOSPITAL_COMMUNITY): Payer: Self-pay

## 2020-10-19 DIAGNOSIS — M810 Age-related osteoporosis without current pathological fracture: Secondary | ICD-10-CM | POA: Diagnosis not present

## 2020-10-23 ENCOUNTER — Other Ambulatory Visit (HOSPITAL_COMMUNITY): Payer: Self-pay

## 2020-10-23 ENCOUNTER — Other Ambulatory Visit: Payer: Self-pay | Admitting: Internal Medicine

## 2020-10-25 ENCOUNTER — Other Ambulatory Visit (HOSPITAL_COMMUNITY): Payer: Self-pay

## 2020-10-25 MED ORDER — ONDANSETRON HCL 4 MG PO TABS
ORAL_TABLET | ORAL | 4 refills | Status: DC
  Filled 2020-10-25: qty 60, 20d supply, fill #0
  Filled 2021-02-10: qty 60, 20d supply, fill #1
  Filled 2021-04-02: qty 60, 20d supply, fill #2
  Filled 2021-05-10: qty 60, 20d supply, fill #3
  Filled 2021-10-19: qty 60, 20d supply, fill #4

## 2020-10-27 ENCOUNTER — Ambulatory Visit: Payer: 59 | Admitting: Internal Medicine

## 2020-10-29 ENCOUNTER — Other Ambulatory Visit (HOSPITAL_COMMUNITY): Payer: Self-pay

## 2020-11-05 ENCOUNTER — Other Ambulatory Visit (HOSPITAL_COMMUNITY): Payer: Self-pay

## 2020-11-05 ENCOUNTER — Ambulatory Visit (INDEPENDENT_AMBULATORY_CARE_PROVIDER_SITE_OTHER): Payer: 59 | Admitting: Internal Medicine

## 2020-11-05 ENCOUNTER — Encounter: Payer: Self-pay | Admitting: Internal Medicine

## 2020-11-05 ENCOUNTER — Other Ambulatory Visit: Payer: Self-pay | Admitting: Internal Medicine

## 2020-11-05 ENCOUNTER — Other Ambulatory Visit: Payer: Self-pay

## 2020-11-05 DIAGNOSIS — G894 Chronic pain syndrome: Secondary | ICD-10-CM | POA: Diagnosis not present

## 2020-11-05 DIAGNOSIS — G43109 Migraine with aura, not intractable, without status migrainosus: Secondary | ICD-10-CM

## 2020-11-05 DIAGNOSIS — M0579 Rheumatoid arthritis with rheumatoid factor of multiple sites without organ or systems involvement: Secondary | ICD-10-CM

## 2020-11-05 MED ORDER — ACETAMINOPHEN-CODEINE #3 300-30 MG PO TABS
ORAL_TABLET | ORAL | 5 refills | Status: DC
  Filled 2020-11-05 – 2020-11-20 (×2): qty 120, 30d supply, fill #0

## 2020-11-05 MED ORDER — BUTALBITAL-ASA-CAFF-CODEINE 50-325-40-30 MG PO CAPS
ORAL_CAPSULE | ORAL | 5 refills | Status: DC
  Filled 2020-11-05: qty 180, 30d supply, fill #0

## 2020-11-05 MED ORDER — ZOLMITRIPTAN 2.5 MG PO TABS
ORAL_TABLET | ORAL | 5 refills | Status: DC
  Filled 2020-11-05 – 2021-04-18 (×4): qty 8, 30d supply, fill #0
  Filled 2021-04-25: qty 8, 28d supply, fill #0
  Filled 2021-06-23: qty 8, 28d supply, fill #1
  Filled 2021-07-16: qty 8, 28d supply, fill #2
  Filled 2021-08-13: qty 8, 28d supply, fill #3
  Filled 2021-08-17: qty 12, 32d supply, fill #4
  Filled 2021-08-19: qty 8, 28d supply, fill #4
  Filled 2021-11-03: qty 6, 6d supply, fill #4
  Filled 2021-11-04: qty 2, 24d supply, fill #4

## 2020-11-05 NOTE — Assessment & Plan Note (Addendum)
Now with taking  Methotrexate and enbrel by dr Jefm Bryant after intolerant of humira and earlier trial of MTX. Pain management with tylenol #3 and Fiorinal due to gastric ulcer history
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
## 2020-11-06 ENCOUNTER — Other Ambulatory Visit (HOSPITAL_COMMUNITY): Payer: Self-pay

## 2020-11-07 DIAGNOSIS — G894 Chronic pain syndrome: Secondary | ICD-10-CM | POA: Insufficient documentation

## 2020-11-07 NOTE — Assessment & Plan Note (Signed)
She has arthritic pain and frequent headaches.  Management with Tylenol #3 and Butalbital due to history of gastric ulcer, NSAID induced.  Refill history confirmed via [place] Controlled Substance databas, accessed by me today.Alexandra Lewis

## 2020-11-07 NOTE — Assessment & Plan Note (Signed)
Occurring 2-3 times per week, often  weather related/triggered. sometimes waking her up at night  USING  Fiorinal, Tyelnol #3  and zomig . Refills given

## 2020-11-08 ENCOUNTER — Other Ambulatory Visit (HOSPITAL_COMMUNITY): Payer: Self-pay

## 2020-11-09 ENCOUNTER — Other Ambulatory Visit (HOSPITAL_COMMUNITY): Payer: Self-pay

## 2020-11-10 ENCOUNTER — Other Ambulatory Visit (HOSPITAL_COMMUNITY): Payer: Self-pay

## 2020-11-20 ENCOUNTER — Other Ambulatory Visit (HOSPITAL_COMMUNITY): Payer: Self-pay

## 2020-11-23 ENCOUNTER — Other Ambulatory Visit (HOSPITAL_COMMUNITY): Payer: Self-pay

## 2020-11-24 ENCOUNTER — Other Ambulatory Visit (HOSPITAL_COMMUNITY): Payer: Self-pay

## 2020-11-25 ENCOUNTER — Ambulatory Visit: Payer: 59 | Admitting: Dermatology

## 2020-11-29 ENCOUNTER — Other Ambulatory Visit (HOSPITAL_COMMUNITY): Payer: Self-pay

## 2020-12-01 ENCOUNTER — Other Ambulatory Visit (HOSPITAL_COMMUNITY): Payer: Self-pay

## 2020-12-06 ENCOUNTER — Other Ambulatory Visit (HOSPITAL_COMMUNITY): Payer: Self-pay

## 2020-12-06 MED FILL — Pantoprazole Sodium EC Tab 40 MG (Base Equiv): ORAL | 90 days supply | Qty: 90 | Fill #0 | Status: AC

## 2020-12-07 ENCOUNTER — Other Ambulatory Visit (HOSPITAL_COMMUNITY): Payer: Self-pay

## 2020-12-08 ENCOUNTER — Other Ambulatory Visit (HOSPITAL_COMMUNITY): Payer: Self-pay

## 2020-12-08 MED ORDER — LIDOCAINE-PRILOCAINE 2.5-2.5 % EX CREA
TOPICAL_CREAM | CUTANEOUS | 0 refills | Status: DC
  Filled 2020-12-08: qty 30, 84d supply, fill #0

## 2020-12-14 DIAGNOSIS — R399 Unspecified symptoms and signs involving the genitourinary system: Secondary | ICD-10-CM | POA: Diagnosis not present

## 2020-12-14 DIAGNOSIS — M810 Age-related osteoporosis without current pathological fracture: Secondary | ICD-10-CM | POA: Diagnosis not present

## 2020-12-14 DIAGNOSIS — M0579 Rheumatoid arthritis with rheumatoid factor of multiple sites without organ or systems involvement: Secondary | ICD-10-CM | POA: Diagnosis not present

## 2020-12-16 ENCOUNTER — Other Ambulatory Visit (HOSPITAL_COMMUNITY): Payer: Self-pay

## 2020-12-21 ENCOUNTER — Other Ambulatory Visit (HOSPITAL_COMMUNITY): Payer: Self-pay

## 2020-12-29 ENCOUNTER — Other Ambulatory Visit (HOSPITAL_COMMUNITY): Payer: Self-pay

## 2020-12-30 ENCOUNTER — Other Ambulatory Visit (HOSPITAL_COMMUNITY): Payer: Self-pay

## 2020-12-31 ENCOUNTER — Other Ambulatory Visit (HOSPITAL_COMMUNITY): Payer: Self-pay

## 2020-12-31 MED FILL — Ergocalciferol Cap 1.25 MG (50000 Unit): ORAL | 84 days supply | Qty: 12 | Fill #0 | Status: AC

## 2020-12-31 MED FILL — Cyanocobalamin Inj 1000 MCG/ML: INTRAMUSCULAR | 90 days supply | Qty: 6 | Fill #0 | Status: AC

## 2021-01-12 ENCOUNTER — Other Ambulatory Visit: Payer: Self-pay | Admitting: Internal Medicine

## 2021-01-13 ENCOUNTER — Other Ambulatory Visit (HOSPITAL_COMMUNITY): Payer: Self-pay

## 2021-01-25 ENCOUNTER — Other Ambulatory Visit (HOSPITAL_COMMUNITY): Payer: Self-pay

## 2021-02-10 ENCOUNTER — Other Ambulatory Visit (HOSPITAL_COMMUNITY): Payer: Self-pay

## 2021-02-11 ENCOUNTER — Other Ambulatory Visit (HOSPITAL_COMMUNITY): Payer: Self-pay

## 2021-02-15 ENCOUNTER — Other Ambulatory Visit (HOSPITAL_COMMUNITY): Payer: Self-pay

## 2021-02-18 ENCOUNTER — Other Ambulatory Visit (HOSPITAL_COMMUNITY): Payer: Self-pay

## 2021-02-22 ENCOUNTER — Other Ambulatory Visit (HOSPITAL_COMMUNITY): Payer: Self-pay

## 2021-03-02 ENCOUNTER — Other Ambulatory Visit (HOSPITAL_COMMUNITY): Payer: Self-pay

## 2021-03-02 ENCOUNTER — Other Ambulatory Visit: Payer: Self-pay | Admitting: Internal Medicine

## 2021-03-02 DIAGNOSIS — T39095S Adverse effect of salicylates, sequela: Secondary | ICD-10-CM

## 2021-03-02 MED ORDER — PANTOPRAZOLE SODIUM 40 MG PO TBEC
DELAYED_RELEASE_TABLET | Freq: Every day | ORAL | 3 refills | Status: DC
  Filled 2021-03-02: qty 90, 90d supply, fill #0
  Filled 2021-05-26: qty 90, 90d supply, fill #1
  Filled 2021-08-29: qty 90, 90d supply, fill #2
  Filled 2021-11-24: qty 90, 90d supply, fill #3

## 2021-03-10 ENCOUNTER — Other Ambulatory Visit (HOSPITAL_COMMUNITY): Payer: Self-pay

## 2021-03-17 ENCOUNTER — Ambulatory Visit: Payer: 59 | Admitting: Dermatology

## 2021-03-31 MED FILL — Cyanocobalamin Inj 1000 MCG/ML: INTRAMUSCULAR | 90 days supply | Qty: 6 | Fill #1 | Status: AC

## 2021-04-01 ENCOUNTER — Other Ambulatory Visit (HOSPITAL_COMMUNITY): Payer: Self-pay

## 2021-04-02 ENCOUNTER — Other Ambulatory Visit (HOSPITAL_COMMUNITY): Payer: Self-pay

## 2021-04-05 ENCOUNTER — Other Ambulatory Visit (HOSPITAL_COMMUNITY): Payer: Self-pay

## 2021-04-06 ENCOUNTER — Other Ambulatory Visit (HOSPITAL_COMMUNITY): Payer: Self-pay

## 2021-04-07 ENCOUNTER — Other Ambulatory Visit: Payer: Self-pay

## 2021-04-07 ENCOUNTER — Other Ambulatory Visit (HOSPITAL_COMMUNITY): Payer: Self-pay

## 2021-04-07 MED ORDER — FOLIC ACID 1 MG PO TABS
2.0000 mg | ORAL_TABLET | Freq: Every day | ORAL | 1 refills | Status: DC
  Filled 2021-04-07: qty 30, 15d supply, fill #0
  Filled 2021-05-10: qty 30, 15d supply, fill #1

## 2021-04-08 ENCOUNTER — Other Ambulatory Visit (HOSPITAL_COMMUNITY): Payer: Self-pay

## 2021-04-08 ENCOUNTER — Encounter (HOSPITAL_COMMUNITY): Payer: Self-pay

## 2021-04-12 ENCOUNTER — Other Ambulatory Visit (HOSPITAL_COMMUNITY): Payer: Self-pay

## 2021-04-19 ENCOUNTER — Other Ambulatory Visit (HOSPITAL_COMMUNITY): Payer: Self-pay

## 2021-04-19 ENCOUNTER — Telehealth: Payer: Self-pay | Admitting: Internal Medicine

## 2021-04-19 NOTE — Telephone Encounter (Signed)
Pt husband called in stating that [place] advise Pt that the medication (ZOLMitriptan (ZOMIG) 2.5 MG tablet) needs doctor approval. Pt stated that pharmacy advise them that they had sent over the information about 2 weeks ago. Pt stated that pharmacy stated that they sent over information today as well. Pt requesting callback at [phone]

## 2021-04-20 ENCOUNTER — Other Ambulatory Visit (HOSPITAL_COMMUNITY): Payer: Self-pay

## 2021-04-20 NOTE — Telephone Encounter (Signed)
PA has been submitted and pt's husband is aware.

## 2021-04-21 ENCOUNTER — Telehealth: Payer: Self-pay

## 2021-04-21 ENCOUNTER — Encounter: Payer: Self-pay | Admitting: Internal Medicine

## 2021-04-21 NOTE — Telephone Encounter (Signed)
Are you able to help with this? I do not know the answer to this question.  Dr. Derrel Nip: I am planning to enroll in a [facility] Advantage Plan.  However, I need to make sure that you accept this insurance at your office. Can you verify that you are a provider for this Medicare Advantage Program? Thank you. Stann Ore

## 2021-04-22 NOTE — Telephone Encounter (Signed)
We do not have a list of all the different insurances that the provider is credentialed with. She has to call her insurance carrier to see if she is in network.

## 2021-04-25 ENCOUNTER — Other Ambulatory Visit (HOSPITAL_COMMUNITY): Payer: Self-pay

## 2021-04-26 ENCOUNTER — Other Ambulatory Visit (HOSPITAL_COMMUNITY): Payer: Self-pay

## 2021-04-27 NOTE — Telephone Encounter (Signed)
PA has been approved through 04/20/2022. Mychart message sent to let pt know.

## 2021-04-28 ENCOUNTER — Other Ambulatory Visit (HOSPITAL_COMMUNITY): Payer: Self-pay

## 2021-04-29 ENCOUNTER — Other Ambulatory Visit: Payer: Self-pay

## 2021-04-29 ENCOUNTER — Ambulatory Visit (INDEPENDENT_AMBULATORY_CARE_PROVIDER_SITE_OTHER): Payer: 59 | Admitting: Internal Medicine

## 2021-04-29 ENCOUNTER — Encounter: Payer: Self-pay | Admitting: Internal Medicine

## 2021-04-29 VITALS — BP 110/62 | HR 75 | Temp 96.6°F | Ht 64.0 in | Wt 91.8 lb

## 2021-04-29 DIAGNOSIS — R7301 Impaired fasting glucose: Secondary | ICD-10-CM | POA: Diagnosis not present

## 2021-04-29 DIAGNOSIS — Z1211 Encounter for screening for malignant neoplasm of colon: Secondary | ICD-10-CM | POA: Diagnosis not present

## 2021-04-29 DIAGNOSIS — E785 Hyperlipidemia, unspecified: Secondary | ICD-10-CM

## 2021-04-29 DIAGNOSIS — T39395A Adverse effect of other nonsteroidal anti-inflammatory drugs [NSAID], initial encounter: Secondary | ICD-10-CM

## 2021-04-29 DIAGNOSIS — Z79899 Other long term (current) drug therapy: Secondary | ICD-10-CM | POA: Diagnosis not present

## 2021-04-29 DIAGNOSIS — E559 Vitamin D deficiency, unspecified: Secondary | ICD-10-CM

## 2021-04-29 DIAGNOSIS — M818 Other osteoporosis without current pathological fracture: Secondary | ICD-10-CM

## 2021-04-29 DIAGNOSIS — M0579 Rheumatoid arthritis with rheumatoid factor of multiple sites without organ or systems involvement: Secondary | ICD-10-CM

## 2021-04-29 DIAGNOSIS — G894 Chronic pain syndrome: Secondary | ICD-10-CM

## 2021-04-29 DIAGNOSIS — K259 Gastric ulcer, unspecified as acute or chronic, without hemorrhage or perforation: Secondary | ICD-10-CM

## 2021-04-29 DIAGNOSIS — Z1231 Encounter for screening mammogram for malignant neoplasm of breast: Secondary | ICD-10-CM

## 2021-04-29 DIAGNOSIS — R634 Abnormal weight loss: Secondary | ICD-10-CM | POA: Diagnosis not present

## 2021-04-29 DIAGNOSIS — M791 Myalgia, unspecified site: Secondary | ICD-10-CM | POA: Diagnosis not present

## 2021-04-29 DIAGNOSIS — T466X5A Adverse effect of antihyperlipidemic and antiarteriosclerotic drugs, initial encounter: Secondary | ICD-10-CM

## 2021-04-29 MED ORDER — QUETIAPINE FUMARATE 25 MG PO TABS: 12.5000 mg | ORAL_TABLET | Freq: Every day | ORAL | 0 refills | Status: DC

## 2021-04-29 MED ORDER — CALCITONIN (SALMON) 200 UNIT/ACT NA SOLN: 1.0000 | Freq: Every day | NASAL | 12 refills | Status: DC

## 2021-04-29 MED ORDER — LIDOCAINE-PRILOCAINE 2.5-2.5 % EX CREA: 1.0000 "application " | TOPICAL_CREAM | CUTANEOUS | 0 refills | Status: DC | PRN

## 2021-04-29 NOTE — Patient Instructions (Signed)
Trial of calcitonin nasal spray for treatment of osteoporosis    Trial of generic Seroquel,  12.5 mg (1/2 tablet) for the nightmars

## 2021-04-29 NOTE — Assessment & Plan Note (Signed)
Intolerant of prior trials

## 2021-04-29 NOTE — Progress Notes (Signed)
Subjective:  Patient ID: Alexandra Lewis, female    DOB: 1956-04-08  Age: 65 y.o. MRN: [id number]  CC: The primary encounter diagnosis was Long-term use of high-risk medication. Diagnoses of Hyperlipidemia, unspecified hyperlipidemia type, Impaired fasting glucose, Rheumatoid arthritis involving multiple sites with positive rheumatoid factor ([place]), Colon cancer screening, Breast cancer screening by mammogram, Myalgia due to statin, Vitamin D deficiency, WEIGHT LOSS-ABNORMAL, Other osteoporosis without current pathological fracture, NSAID-induced gastric ulcer- pylorus, and Chronic pain disorder were also pertinent to this visit.  HPI Alexandra Lewis presents for  Chief Complaint  Patient presents with   Follow-up    6 month follow up    RA:  did not tolerate mTX per Rheumatology note.  Enbrel started and tolerating well  Osteoporosis:  currently untreated due to multiple intolerances and contraindications .  Most recentely she developed severe intolerance to  [facility]  which patient stopped after 4 doses  due to multiple odd symptoms including the development of a  rapidly progressive bunion on both sides of right foot.   History of forteo used for several months  doesn't remember why she stopped giving herself the shots.  Nausea most likely.  Alendronate was not tolerated due to esophageal issues.  Evista contraindicated due to h/o CVA in 2013 . Discussed trial of miacalcin   Not sleeping well due to nightly occurrence of night terrors.  Disussed serqouel 12. 5 mg trial  Frequent headaches aggravated by barometric pressure changes.  Also having ocular migraines once a week .  Using fioricet    Outpatient Medications Prior to Visit  Medication Sig Dispense Refill   cyanocobalamin (,VITAMIN B-12,) 1000 MCG/ML injection INJECT 1 ML (1,000 MCG TOTAL) INTO THE SKIN EVERY 14 (FOURTEEN) DAYS. 6 mL 5   divalproex (DEPAKOTE) 125 MG DR tablet TAKE ONE TABLET BY MOUTH DAILY 90 tablet 2    ergocalciferol (VITAMIN D2) 1.25 MG (50000 UT) capsule Take 2 capsules (100,000 Units total) by mouth once a week 8 capsule 5   etanercept (ENBREL SURECLICK) 50 MG/ML injection Inject 50 mg into the skin once a week as directed. 4 mL 12   Famotidine (PEPCID PO) Take by mouth.     folic acid (FOLVITE) 1 MG tablet Take 2 tablets (2 mg total) by mouth daily. 30 tablet 1   INS SYRINGE/NEEDLE 1CC/28G (B-D INSULIN SYRINGE 1CC/28G) 28G X 1/2" 1 ML MISC For use with b12 injections 10 each 3   lidocaine-prilocaine (EMLA) cream Use as directed before injection. 30 g 0   ondansetron (ZOFRAN) 4 MG tablet TAKE 1 TABLET BY MOUTH EVERY 8 HOURS AS NEEDED FOR NAUSEA OR VOMITING 60 tablet 4   pantoprazole (PROTONIX) 40 MG tablet TAKE 1 TABLET BY MOUTH ONCE A DAY 90 tablet 3   TUBERCULIN SYR 1CC/27GX1/2" (B-D TB SYRINGE 1CC/27GX1/2") 27G X 1/2" 1 ML MISC Use to inject methotrexate 10 each 1   ZOLMitriptan (ZOMIG) 2.5 MG tablet Take 1/2 to 1 tablet as needed for migraine headaches 8 tablet 5   acetaminophen-codeine (TYLENOL #3) 300-30 MG tablet TAKE ONE TABLET BY MOUTH EVERY 6 HOURS AS NEEDED FOR MODERATE PAIN **MAX OF 4 TABLETS PER DAY** 120 tablet 5   butalbital-aspirin-caffeine-codeine (FIORINAL WITH CODEINE) 50-325-40-30 MG capsule TAKE TWO CAPSULES BY MOUTH EVERY 6 HOURS AS NEEDED FOR PAIN. MAX OF 6 PER DAY 180 capsule 5   mirabegron ER (MYRBETRIQ) 25 MG TB24 tablet TAKE 1 TABLET BY MOUTH ONCE A DAY (Patient not taking: Reported on 04/29/2021) 30 tablet  10   methotrexate 50 MG/2ML injection Inject 0.4 mls into the skin once a week (Patient not taking: Reported on 04/29/2021) 10 mL 1   Romosozumab-aqqg (EVENITY) 105 MG/1.17ML SOSY injection Inject 2.34 mLs (210 mg total) subcutaneously every 28 (twenty-eight) days (Patient not taking: Reported on 04/29/2021) 7.02 mL 3   No facility-administered medications prior to visit.    Review of Systems;  Patient denies headache, fevers, malaise, unintentional weight loss,  skin rash, eye pain, sinus congestion and sinus pain, sore throat, dysphagia,  hemoptysis , cough, dyspnea, wheezing, chest pain, palpitations, orthopnea, edema, abdominal pain, nausea, melena, diarrhea, constipation, flank pain, dysuria, hematuria, urinary  Frequency, nocturia, numbness, tingling, seizures,  Focal weakness, Loss of consciousness,  Tremor, insomnia, depression, anxiety, and suicidal ideation.      Objective:  BP 110/62 (BP Location: Left Arm, Patient Position: Sitting, Cuff Size: Normal)   Pulse 75   Temp (!) 96.6 F (35.9 C) (Temporal)   Ht 5' 4"  (1.626 m)   Wt 91 lb 12.8 oz (41.6 kg)   SpO2 94%   BMI 15.76 kg/m   BP Readings from Last 3 Encounters:  04/29/21 110/62  11/05/20 112/74  02/25/20 102/60    Wt Readings from Last 3 Encounters:  04/29/21 91 lb 12.8 oz (41.6 kg)  11/05/20 94 lb 9.6 oz (42.9 kg)  02/25/20 93 lb (42.2 kg)    General appearance: alert, cooperative and appears stated age Ears: normal TM's and external ear canals both ears Throat: lips, mucosa, and tongue normal; teeth and gums normal Neck: no adenopathy, no carotid bruit, supple, symmetrical, trachea midline and thyroid not enlarged, symmetric, no tenderness/mass/nodules Back: symmetric, no curvature. ROM normal. No CVA tenderness. Lungs: clear to auscultation bilaterally Heart: regular rate and rhythm, S1, S2 normal, no murmur, click, rub or gallop Abdomen: soft, non-tender; bowel sounds normal; no masses,  no organomegaly Pulses: 2+ and symmetric Skin: Skin color, texture, turgor normal. No rashes or lesions Lymph nodes: Cervical, supraclavicular, and axillary nodes normal.  No results found for: HGBA1C  Lab Results  Component Value Date   CREATININE 0.97 04/29/2021   CREATININE 0.97 02/25/2020   CREATININE 0.84 08/18/2019    Lab Results  Component Value Date   WBC 5.3 04/29/2021   HGB 12.4 04/29/2021   HCT 36.5 04/29/2021   PLT 246 04/29/2021   GLUCOSE 80 04/29/2021    CHOL 237 (H) 04/29/2021   TRIG 91 04/29/2021   HDL 104 04/29/2021   LDLDIRECT 112.0 12/29/2015   LDLCALC 114 (H) 04/29/2021   ALT 8 04/29/2021   AST 12 04/29/2021   NA 136 04/29/2021   K 4.3 04/29/2021   CL 99 04/29/2021   CREATININE 0.97 04/29/2021   BUN 16 04/29/2021   CO2 26 04/29/2021   TSH 3.95 04/29/2021    MR Brain W Wo Contrast  Result Date: 05/18/2015 CLINICAL DATA:  65 year old female with posterior headaches with visual aura. Symptoms for 6 months. Initial encounter. 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 EXAM: MRI HEAD WITHOUT AND WITH CONTRAST TECHNIQUE: Multiplanar, multiecho pulse sequences of the brain and surrounding structures were obtained without and with intravenous contrast. CONTRAST:  55m MULTIHANCE GADOBENATE DIMEGLUMINE 529 MG/ML IV SOLN COMPARISON:  Brain MRI 12/08/2011. FINDINGS: No restricted diffusion to suggest acute infarction. No midline shift, mass effect, evidence of mass lesion, ventriculomegaly, extra-axial collection or acute intracranial hemorrhage. Cervicomedullary junction and pituitary are within normal limits. Negative visualized cervical spine. Major intracranial vascular flow voids are stable. Cerebral  volume is stable since 2013. Expected evolution of the small posterior left corona radiata lacunar infarct. No associated hemosiderin. No cortical encephalomalacia, and elsewhere Stable and normal for age gray and white matter signal. No abnormal enhancement identified. No dural thickening identified. Visible internal auditory structures appear normal. Mastoids are clear. Mild paranasal sinus mucosal thickening is stable. Orbit and scalp soft tissues are within normal limits. IMPRESSION: 1.  No acute intracranial abnormality. 2. Negative for age MRI appearance of the brain aside from expected chronic appearance of the 2013 posterior left corona radiata lacunar infarct. Electronically Signed   By: Genevie Ann M.D.   On: 05/18/2015 16:19     Assessment & Plan:   Problem List Items Addressed This Visit     WEIGHT LOSS-ABNORMAL    Weight is stable      Rheumatoid arthritis ([place])    Now intolerant of  Methotrexate.  Taking enbrel by dr Jefm Bryant after intolerant of humira and earlier trial of MTX. Pain management with tylenol #3 and Fiorinal due to gastric ulcer history       Osteoporosis    Currently untreated.  Treatment options limited given recent intolerance to Evenity.  Trial of miacalcin       Relevant Medications   calcitonin, salmon, (MIACALCIN) 200 UNIT/ACT nasal spray   NSAID-induced gastric ulcer- pylorus    Last EGD 2014.  Symptoms resolved on current therapy.       Myalgia due to statin    Intolerant of prior trials       Long-term use of high-risk medication - Primary   Relevant Orders   Comp Met (CMET) (Completed)   TSH (Completed)   CBC with Differential/Platelet (Completed)   Chronic pain disorder    She has arthritic pain and frequent headaches.  Management with Tylenol #3 and Butalbital due to history of gastric ulcer, NSAID induced.  Refill history confirmed via [place] Controlled Substance databas, accessed by me today..      Other Visit Diagnoses     Hyperlipidemia, unspecified hyperlipidemia type       Relevant Orders   Lipid Profile (Completed)   Impaired fasting glucose       Colon cancer screening       Relevant Orders   Cologuard   Breast cancer screening by mammogram       Relevant Orders   MM DIGITAL SCREENING BILATERAL   Vitamin D deficiency       Relevant Orders   VITAMIN D 25 Hydroxy (Vit-D Deficiency, Fractures) (Completed)       Meds ordered this encounter  Medications   calcitonin, salmon, (MIACALCIN) 200 UNIT/ACT nasal spray    Sig: Place 1 spray into alternate nostrils daily.    Dispense:  3.7 mL    Refill:  12   QUEtiapine (SEROQUEL) 25 MG tablet    Sig: Take 0.5 tablets (12.5 mg total) by mouth at bedtime.    Dispense:  15 tablet    Refill:  0    lidocaine-prilocaine (EMLA) cream    Sig: Apply 1 application topically as needed.    Dispense:  30 g    Refill:  0     I provided  45 minutes of  face-to-face time during this encounter reviewing patient's current problems and past surgeries, labs and imaging studies, providing counseling on the above mentioned problems , and coordination  of care .   Follow-up: No follow-ups on file.   Crecencio Mc, MD

## 2021-04-29 NOTE — Assessment & Plan Note (Signed)
Now intolerant of  Methotrexate.  Taking enbrel by dr Jefm Bryant after intolerant of humira and earlier trial of MTX. Pain management with tylenol #3 and Fiorinal due to gastric ulcer history

## 2021-04-30 ENCOUNTER — Other Ambulatory Visit: Payer: Self-pay | Admitting: Internal Medicine

## 2021-04-30 DIAGNOSIS — F514 Sleep terrors [night terrors]: Secondary | ICD-10-CM

## 2021-04-30 DIAGNOSIS — F1120 Opioid dependence, uncomplicated: Secondary | ICD-10-CM

## 2021-04-30 DIAGNOSIS — T39395A Adverse effect of other nonsteroidal anti-inflammatory drugs [NSAID], initial encounter: Secondary | ICD-10-CM

## 2021-04-30 DIAGNOSIS — K259 Gastric ulcer, unspecified as acute or chronic, without hemorrhage or perforation: Secondary | ICD-10-CM

## 2021-04-30 DIAGNOSIS — R634 Abnormal weight loss: Secondary | ICD-10-CM

## 2021-04-30 DIAGNOSIS — M818 Other osteoporosis without current pathological fracture: Secondary | ICD-10-CM

## 2021-04-30 LAB — COMPREHENSIVE METABOLIC PANEL
AG Ratio: 2.2 (calc) (ref 1.0–2.5)
ALT: 8 U/L (ref 6–29)
AST: 12 U/L (ref 10–35)
Albumin: 4.1 g/dL (ref 3.6–5.1)
Alkaline phosphatase (APISO): 90 U/L (ref 37–153)
BUN: 16 mg/dL (ref 7–25)
CO2: 26 mmol/L (ref 20–32)
Calcium: 9.1 mg/dL (ref 8.6–10.4)
Chloride: 99 mmol/L (ref 98–110)
Creat: 0.97 mg/dL (ref 0.50–1.05)
Globulin: 1.9 g/dL (calc) (ref 1.9–3.7)
Glucose, Bld: 80 mg/dL (ref 65–99)
Potassium: 4.3 mmol/L (ref 3.5–5.3)
Sodium: 136 mmol/L (ref 135–146)
Total Bilirubin: 0.2 mg/dL (ref 0.2–1.2)
Total Protein: 6 g/dL — ABNORMAL LOW (ref 6.1–8.1)

## 2021-04-30 LAB — LIPID PANEL
Cholesterol: 237 mg/dL — ABNORMAL HIGH
HDL: 104 mg/dL
LDL Cholesterol (Calc): 114 mg/dL — ABNORMAL HIGH
Non-HDL Cholesterol (Calc): 133 mg/dL — ABNORMAL HIGH
Total CHOL/HDL Ratio: 2.3 (calc)
Triglycerides: 91 mg/dL

## 2021-04-30 LAB — CBC WITH DIFFERENTIAL/PLATELET
Absolute Monocytes: 604 cells/uL (ref 200–950)
Basophils Absolute: 80 cells/uL (ref 0–200)
Basophils Relative: 1.5 %
Eosinophils Absolute: 101 cells/uL (ref 15–500)
Eosinophils Relative: 1.9 %
HCT: 36.5 % (ref 35.0–45.0)
Hemoglobin: 12.4 g/dL (ref 11.7–15.5)
Lymphs Abs: 1950 cells/uL (ref 850–3900)
MCH: 29.5 pg (ref 27.0–33.0)
MCHC: 34 g/dL (ref 32.0–36.0)
MCV: 86.7 fL (ref 80.0–100.0)
MPV: 9.3 fL (ref 7.5–12.5)
Monocytes Relative: 11.4 %
Neutro Abs: 2565 cells/uL (ref 1500–7800)
Neutrophils Relative %: 48.4 %
Platelets: 246 10*3/uL (ref 140–400)
RBC: 4.21 10*6/uL (ref 3.80–5.10)
RDW: 12.5 % (ref 11.0–15.0)
Total Lymphocyte: 36.8 %
WBC: 5.3 10*3/uL (ref 3.8–10.8)

## 2021-04-30 LAB — VITAMIN D 25 HYDROXY (VIT D DEFICIENCY, FRACTURES): Vit D, 25-Hydroxy: 99 ng/mL (ref 30–100)

## 2021-04-30 LAB — TSH: TSH: 3.95 m[IU]/L (ref 0.40–4.50)

## 2021-05-01 DIAGNOSIS — F514 Sleep terrors [night terrors]: Secondary | ICD-10-CM | POA: Insufficient documentation

## 2021-05-01 NOTE — Assessment & Plan Note (Signed)
She has arthritic pain and frequent headaches.  Management with Tylenol #3 and Butalbital due to history of gastric ulcer, NSAID induced.  Refill history confirmed via [place] Controlled Substance databas, accessed by me today.Marland Kitchen

## 2021-05-01 NOTE — Assessment & Plan Note (Signed)
Last EGD 2014.  Symptoms resolved on current therapy.

## 2021-05-01 NOTE — Assessment & Plan Note (Signed)
Weight has been stable.

## 2021-05-01 NOTE — Assessment & Plan Note (Signed)
She has arthritic pain and frequent headaches.  Management with Tylenol #3 and Butalbital due to history of gastric ulcer, NSAID induced.  Refill history confirmed via [place] Controlled Substance databas, accessed by me today.Alexandra Lewis

## 2021-05-01 NOTE — Assessment & Plan Note (Signed)
Treatment options now limited to 2nd line therapy due to multiple intolerances and contraindications.  She did not tolerate Evenity prescribed by Dr Gabriel Carina and does not wish to return to her.   She has no history of fractures.   trial of micalcin

## 2021-05-01 NOTE — Assessment & Plan Note (Signed)
Weight is stable

## 2021-05-01 NOTE — Assessment & Plan Note (Signed)
Currently untreated.  Treatment options limited given recent intolerance to Evenity.  Trial of miacalcin

## 2021-05-01 NOTE — Assessment & Plan Note (Signed)
Trial of low dose seroquel 12.5 mg  qhs

## 2021-05-01 NOTE — Assessment & Plan Note (Signed)
Last EGD was in 2014 .  sympotms have been stable and she has had no weight loss.  Continue current therapy.

## 2021-05-03 ENCOUNTER — Other Ambulatory Visit (HOSPITAL_COMMUNITY): Payer: Self-pay

## 2021-05-04 ENCOUNTER — Other Ambulatory Visit (HOSPITAL_COMMUNITY): Payer: Self-pay

## 2021-05-06 ENCOUNTER — Other Ambulatory Visit (HOSPITAL_COMMUNITY): Payer: Self-pay

## 2021-05-09 ENCOUNTER — Other Ambulatory Visit (HOSPITAL_COMMUNITY): Payer: Self-pay

## 2021-05-09 ENCOUNTER — Encounter: Payer: Self-pay | Admitting: Internal Medicine

## 2021-05-10 ENCOUNTER — Other Ambulatory Visit (HOSPITAL_COMMUNITY): Payer: Self-pay

## 2021-05-11 ENCOUNTER — Other Ambulatory Visit: Payer: Self-pay | Admitting: Internal Medicine

## 2021-05-11 ENCOUNTER — Other Ambulatory Visit (HOSPITAL_COMMUNITY): Payer: Self-pay

## 2021-05-11 MED ORDER — DIAZEPAM 2 MG PO TABS
ORAL_TABLET | Freq: Two times a day (BID) | ORAL | 5 refills | Status: AC | PRN
  Filled 2021-05-11: qty 60, 30d supply, fill #0
  Filled 2021-07-21: qty 60, 30d supply, fill #1
  Filled 2021-10-19: qty 60, 30d supply, fill #2

## 2021-05-11 NOTE — Telephone Encounter (Signed)
RX Refill: valium Last Seen: 04-29-21 Last Ordered: 08-19-20 Next Appt: 10-21-21

## 2021-05-12 ENCOUNTER — Other Ambulatory Visit (HOSPITAL_COMMUNITY): Payer: Self-pay

## 2021-05-17 DIAGNOSIS — Z1211 Encounter for screening for malignant neoplasm of colon: Secondary | ICD-10-CM | POA: Diagnosis not present

## 2021-05-20 ENCOUNTER — Other Ambulatory Visit (HOSPITAL_COMMUNITY): Payer: Self-pay

## 2021-05-26 ENCOUNTER — Other Ambulatory Visit (HOSPITAL_COMMUNITY): Payer: Self-pay

## 2021-05-26 ENCOUNTER — Other Ambulatory Visit: Payer: Self-pay | Admitting: Internal Medicine

## 2021-05-27 ENCOUNTER — Other Ambulatory Visit: Payer: Self-pay | Admitting: Internal Medicine

## 2021-05-27 ENCOUNTER — Other Ambulatory Visit (HOSPITAL_COMMUNITY): Payer: Self-pay

## 2021-05-27 LAB — COLOGUARD: COLOGUARD: NEGATIVE

## 2021-05-27 MED ORDER — QUETIAPINE FUMARATE 25 MG PO TABS
12.5000 mg | ORAL_TABLET | Freq: Every day | ORAL | 0 refills | Status: DC
  Filled 2021-05-27: qty 15, 30d supply, fill #0

## 2021-05-30 ENCOUNTER — Other Ambulatory Visit (HOSPITAL_COMMUNITY): Payer: Self-pay

## 2021-06-23 ENCOUNTER — Other Ambulatory Visit (HOSPITAL_COMMUNITY): Payer: Self-pay

## 2021-06-24 ENCOUNTER — Other Ambulatory Visit (HOSPITAL_COMMUNITY): Payer: Self-pay

## 2021-06-28 ENCOUNTER — Other Ambulatory Visit (HOSPITAL_COMMUNITY): Payer: Self-pay

## 2021-06-28 ENCOUNTER — Other Ambulatory Visit: Payer: Self-pay | Admitting: Internal Medicine

## 2021-06-28 MED ORDER — FOLIC ACID 1 MG PO TABS
2.0000 mg | ORAL_TABLET | Freq: Every day | ORAL | 3 refills | Status: DC
Start: 2021-06-28 — End: 2022-11-28
  Filled 2021-06-28: qty 90, 45d supply, fill #0
  Filled 2022-02-14: qty 90, 45d supply, fill #1

## 2021-06-28 MED ORDER — "SYRINGE/NEEDLE (DISP) 25G X 1"" 3 ML MISC"
3.0000 mL | 0 refills | Status: AC
  Filled 2021-06-28: qty 6, 84d supply, fill #0

## 2021-06-28 MED FILL — Cyanocobalamin Inj 1000 MCG/ML: INTRAMUSCULAR | 90 days supply | Qty: 6 | Fill #2 | Status: AC

## 2021-07-05 ENCOUNTER — Other Ambulatory Visit (HOSPITAL_COMMUNITY): Payer: Self-pay

## 2021-07-06 ENCOUNTER — Other Ambulatory Visit (HOSPITAL_COMMUNITY): Payer: Self-pay

## 2021-07-08 ENCOUNTER — Telehealth: Payer: Self-pay | Admitting: Internal Medicine

## 2021-07-08 DIAGNOSIS — R300 Dysuria: Secondary | ICD-10-CM

## 2021-07-08 DIAGNOSIS — R319 Hematuria, unspecified: Secondary | ICD-10-CM

## 2021-07-08 DIAGNOSIS — N390 Urinary tract infection, site not specified: Secondary | ICD-10-CM

## 2021-07-08 MED ORDER — CIPROFLOXACIN HCL 500 MG PO TABS: 500.0000 mg | ORAL_TABLET | Freq: Two times a day (BID) | ORAL | 0 refills | Status: AC

## 2021-07-08 NOTE — Telephone Encounter (Signed)
Labs ordered.

## 2021-07-08 NOTE — Telephone Encounter (Signed)
Pt spouse called in wanting to drop a urine sample off because he thinks the pt has a UTI. Pt states that he know labs need to be ordered to do so. Pt spouse would like to be called.

## 2021-07-08 NOTE — Telephone Encounter (Signed)
Is this okay if I schedule her for a 15 minute virtual on Monday?

## 2021-07-08 NOTE — Telephone Encounter (Signed)
Called by answering service, calling about urinalysis results.  Note reviewed from [facility] system regarding possible urinary tract infection symptoms.  Urinalysis and urine culture were ordered.  I am unable to see results.  I also see order for urinalysis,  culture from Dr. Derrel Nip.   Called patient - spoke with spouse Alexandra Lewis.  Patient with urinary frequency and dysuria past week. He was able to see results of U/A today my Mychart at [facility]. many bacteria, trace LE, Few rbc, nitrite positive on U/A results by report. Urine culture pending.  Has had P. Mirabilis on prior urine cx's, resistant to macrobid (05/03/20). Intolerance to sulfa in past but no allergy.  Has responded to cipro 250mg  BID more recently by Jefm Bryant.  Last UTI about 6 months ago. Chronic nausea, no new vomiting. No new flank or abdominal pain. Low grade temp around 100-101.   Given timing of symptoms, and low grade fever will start Cipro at 500mg  bid dosing for 7 days (pyelo dosing).   Follow culture results.  Hopefully will be able to see those release through care everywhere but spouse also plans to monitor for results.  Potential side effects and risks of quinolone antibiotics discussed including tendinopathy risks.  RTC precautions/ER precautions given. FYI to Dr. Derrel Nip.

## 2021-07-08 NOTE — Telephone Encounter (Signed)
Spoke with pt's husband and he stated that they went to [facility] and gave a urine sample earlier today.

## 2021-07-15 ENCOUNTER — Other Ambulatory Visit (HOSPITAL_COMMUNITY): Payer: Self-pay

## 2021-07-16 ENCOUNTER — Other Ambulatory Visit (HOSPITAL_COMMUNITY): Payer: Self-pay

## 2021-07-18 ENCOUNTER — Other Ambulatory Visit (HOSPITAL_COMMUNITY): Payer: Self-pay

## 2021-07-19 ENCOUNTER — Other Ambulatory Visit (HOSPITAL_COMMUNITY): Payer: Self-pay

## 2021-07-20 ENCOUNTER — Other Ambulatory Visit (HOSPITAL_COMMUNITY): Payer: Self-pay

## 2021-07-21 ENCOUNTER — Other Ambulatory Visit (HOSPITAL_COMMUNITY): Payer: Self-pay

## 2021-07-22 ENCOUNTER — Other Ambulatory Visit (HOSPITAL_COMMUNITY): Payer: Self-pay

## 2021-07-27 ENCOUNTER — Other Ambulatory Visit (HOSPITAL_COMMUNITY): Payer: Self-pay

## 2021-08-05 ENCOUNTER — Other Ambulatory Visit (HOSPITAL_COMMUNITY): Payer: Self-pay

## 2021-08-13 ENCOUNTER — Other Ambulatory Visit (HOSPITAL_COMMUNITY): Payer: Self-pay

## 2021-08-15 ENCOUNTER — Other Ambulatory Visit (HOSPITAL_COMMUNITY): Payer: Self-pay

## 2021-08-16 ENCOUNTER — Other Ambulatory Visit (HOSPITAL_COMMUNITY): Payer: Self-pay

## 2021-08-17 ENCOUNTER — Other Ambulatory Visit (HOSPITAL_COMMUNITY): Payer: Self-pay

## 2021-08-18 ENCOUNTER — Other Ambulatory Visit (HOSPITAL_COMMUNITY): Payer: Self-pay

## 2021-08-18 MED ORDER — ENBREL SURECLICK 50 MG/ML ~~LOC~~ SOAJ
SUBCUTANEOUS | 1 refills | Status: DC
  Filled 2021-08-18: qty 12, 84d supply, fill #0
  Filled 2021-11-03: qty 4, 28d supply, fill #1
  Filled 2021-12-21: qty 4, 28d supply, fill #2
  Filled 2022-03-03: qty 4, 28d supply, fill #3

## 2021-08-19 ENCOUNTER — Other Ambulatory Visit (HOSPITAL_COMMUNITY): Payer: Self-pay

## 2021-08-22 ENCOUNTER — Other Ambulatory Visit (HOSPITAL_COMMUNITY): Payer: Self-pay

## 2021-08-24 ENCOUNTER — Other Ambulatory Visit (HOSPITAL_COMMUNITY): Payer: Self-pay

## 2021-08-29 ENCOUNTER — Other Ambulatory Visit (HOSPITAL_COMMUNITY): Payer: Self-pay

## 2021-09-06 ENCOUNTER — Other Ambulatory Visit (HOSPITAL_COMMUNITY): Payer: Self-pay

## 2021-09-15 ENCOUNTER — Telehealth: Payer: Self-pay

## 2021-09-15 DIAGNOSIS — R3989 Other symptoms and signs involving the genitourinary system: Secondary | ICD-10-CM

## 2021-09-15 NOTE — Telephone Encounter (Signed)
I have pended referral.  

## 2021-09-15 NOTE — Telephone Encounter (Signed)
Referral to [facility] made ? ?

## 2021-09-15 NOTE — Telephone Encounter (Signed)
Per conversation with husband regarding patient, he is requesting a referral for his wife to see Bjorn Loser, MD at [facility], for persistent bladder pain. ?

## 2021-09-16 NOTE — Telephone Encounter (Signed)
Pt's husband is aware that the referral has been placed.  ?

## 2021-09-18 ENCOUNTER — Encounter: Payer: Self-pay | Admitting: Internal Medicine

## 2021-10-03 ENCOUNTER — Ambulatory Visit: Payer: Medicare Other | Admitting: Urology

## 2021-10-08 ENCOUNTER — Other Ambulatory Visit: Payer: Self-pay | Admitting: Internal Medicine

## 2021-10-12 ENCOUNTER — Telehealth: Payer: Self-pay | Admitting: Internal Medicine

## 2021-10-12 NOTE — Telephone Encounter (Signed)
Lft pt vm to call ofc . thanks 

## 2021-10-19 ENCOUNTER — Ambulatory Visit: Payer: Medicare Other | Admitting: Internal Medicine

## 2021-10-20 ENCOUNTER — Other Ambulatory Visit (HOSPITAL_COMMUNITY): Payer: Self-pay

## 2021-10-21 ENCOUNTER — Ambulatory Visit: Payer: 59 | Admitting: Internal Medicine

## 2021-10-21 ENCOUNTER — Other Ambulatory Visit: Payer: Self-pay | Admitting: Internal Medicine

## 2021-10-21 ENCOUNTER — Other Ambulatory Visit (HOSPITAL_COMMUNITY): Payer: Self-pay

## 2021-10-25 ENCOUNTER — Telehealth: Payer: Self-pay

## 2021-10-25 NOTE — Telephone Encounter (Signed)
PA for zomig has been submitted on covermymeds.

## 2021-10-31 ENCOUNTER — Ambulatory Visit: Payer: Self-pay | Admitting: Urology

## 2021-10-31 ENCOUNTER — Other Ambulatory Visit (HOSPITAL_COMMUNITY): Payer: Self-pay

## 2021-11-03 ENCOUNTER — Other Ambulatory Visit (HOSPITAL_COMMUNITY): Payer: Self-pay

## 2021-11-04 ENCOUNTER — Other Ambulatory Visit (HOSPITAL_COMMUNITY): Payer: Self-pay

## 2021-11-09 ENCOUNTER — Other Ambulatory Visit (HOSPITAL_COMMUNITY): Payer: Self-pay

## 2021-11-23 ENCOUNTER — Ambulatory Visit: Payer: Medicare Other | Admitting: Internal Medicine

## 2021-11-23 ENCOUNTER — Telehealth: Payer: Self-pay

## 2021-11-23 NOTE — Telephone Encounter (Signed)
Patient's husband, Lachrisha Ziebarth, returned our call.  Jori Moll states he called our office earlier today to let us know that patient has a severe migraine and won't be able to make it to her appointment.  Jori Moll states that someone from our office called at 3:15pm and he is returning their call.  I spoke with Jenate Martinique, CMA, who is working with Dr. Deborra Medina today and she states that Denita Lung, [place], called him.  I transferred call to [facility].

## 2021-11-24 ENCOUNTER — Other Ambulatory Visit (HOSPITAL_COMMUNITY): Payer: Self-pay

## 2021-11-24 ENCOUNTER — Other Ambulatory Visit: Payer: Self-pay | Admitting: Internal Medicine

## 2021-11-29 ENCOUNTER — Other Ambulatory Visit: Payer: Self-pay | Admitting: Internal Medicine

## 2021-11-29 ENCOUNTER — Other Ambulatory Visit (HOSPITAL_COMMUNITY): Payer: Self-pay

## 2021-11-29 MED ORDER — ZOLMITRIPTAN 2.5 MG PO TABS
ORAL_TABLET | ORAL | 5 refills | Status: DC
  Filled 2021-11-29: qty 2, 2d supply, fill #0
  Filled 2021-12-02: qty 6, 26d supply, fill #0
  Filled 2021-12-31: qty 8, 28d supply, fill #1
  Filled 2022-01-30: qty 8, 28d supply, fill #2
  Filled 2022-03-03: qty 2, 1d supply, fill #3
  Filled 2022-03-08: qty 6, 27d supply, fill #3
  Filled 2022-03-31: qty 8, 28d supply, fill #4
  Filled 2022-05-01: qty 6, 20d supply, fill #5
  Filled 2022-05-03: qty 8, 28d supply, fill #5

## 2021-12-01 ENCOUNTER — Encounter: Payer: Medicare Other | Admitting: Dermatology

## 2021-12-01 ENCOUNTER — Other Ambulatory Visit (HOSPITAL_COMMUNITY): Payer: Self-pay

## 2021-12-02 ENCOUNTER — Other Ambulatory Visit (HOSPITAL_COMMUNITY): Payer: Self-pay

## 2021-12-05 ENCOUNTER — Other Ambulatory Visit (HOSPITAL_COMMUNITY): Payer: Self-pay

## 2021-12-13 ENCOUNTER — Ambulatory Visit: Payer: Medicare Other | Admitting: Internal Medicine

## 2021-12-17 ENCOUNTER — Other Ambulatory Visit: Payer: Self-pay | Admitting: Internal Medicine

## 2021-12-21 ENCOUNTER — Other Ambulatory Visit (HOSPITAL_COMMUNITY): Payer: Self-pay

## 2021-12-26 ENCOUNTER — Telehealth: Payer: Self-pay | Admitting: Internal Medicine

## 2021-12-26 NOTE — Telephone Encounter (Signed)
Copied from [place] [phone]. Topic: Medicare AWV >> Dec 26, 2021 10:30 AM Devoria Glassing wrote: Reason for CRM: Left message for patient to schedule Annual Wellness Visit.  Please schedule with Nurse Health Advisor Denisa O'Brien-Blaney, LPN at [facility]. This appt can be telephone or office visit.  Please call [phone] ask for [facility]

## 2021-12-31 ENCOUNTER — Other Ambulatory Visit (HOSPITAL_COMMUNITY): Payer: Self-pay

## 2022-01-03 ENCOUNTER — Other Ambulatory Visit (HOSPITAL_COMMUNITY): Payer: Self-pay

## 2022-01-19 ENCOUNTER — Other Ambulatory Visit (HOSPITAL_COMMUNITY): Payer: Self-pay

## 2022-01-25 ENCOUNTER — Other Ambulatory Visit: Payer: Self-pay | Admitting: Internal Medicine

## 2022-01-25 ENCOUNTER — Ambulatory Visit: Payer: Medicare Other | Admitting: Internal Medicine

## 2022-01-25 ENCOUNTER — Telehealth: Payer: Self-pay | Admitting: Internal Medicine

## 2022-01-25 NOTE — Telephone Encounter (Signed)
Pt husband would like to be called regarding pt

## 2022-01-26 NOTE — Telephone Encounter (Signed)
Spoke with pt's husband and he stated that he was just wanting to check on the pt's rxs, then he realized that it was already handled.

## 2022-01-31 ENCOUNTER — Other Ambulatory Visit (HOSPITAL_COMMUNITY): Payer: Self-pay

## 2022-02-01 ENCOUNTER — Other Ambulatory Visit (HOSPITAL_COMMUNITY): Payer: Self-pay

## 2022-02-10 ENCOUNTER — Telehealth: Payer: Medicare Other | Admitting: Physician Assistant

## 2022-02-10 DIAGNOSIS — R3989 Other symptoms and signs involving the genitourinary system: Secondary | ICD-10-CM | POA: Diagnosis not present

## 2022-02-10 MED ORDER — CEPHALEXIN 500 MG PO CAPS: 500.0000 mg | ORAL_CAPSULE | Freq: Two times a day (BID) | ORAL | 0 refills | Status: DC

## 2022-02-10 NOTE — Progress Notes (Signed)

## 2022-02-14 ENCOUNTER — Other Ambulatory Visit (HOSPITAL_COMMUNITY): Payer: Self-pay

## 2022-02-15 ENCOUNTER — Other Ambulatory Visit (HOSPITAL_COMMUNITY): Payer: Self-pay

## 2022-02-16 ENCOUNTER — Other Ambulatory Visit (HOSPITAL_COMMUNITY): Payer: Self-pay

## 2022-02-18 ENCOUNTER — Other Ambulatory Visit (HOSPITAL_COMMUNITY): Payer: Self-pay

## 2022-02-18 ENCOUNTER — Other Ambulatory Visit: Payer: Self-pay | Admitting: Internal Medicine

## 2022-02-20 ENCOUNTER — Other Ambulatory Visit (HOSPITAL_COMMUNITY): Payer: Self-pay

## 2022-02-20 MED ORDER — ERGOCALCIFEROL 1.25 MG (50000 UT) PO CAPS
ORAL_CAPSULE | ORAL | 5 refills | Status: DC
  Filled 2022-02-20: qty 8, 28d supply, fill #0
  Filled 2022-04-30: qty 8, 28d supply, fill #1
  Filled 2022-08-09: qty 8, 28d supply, fill #2
  Filled 2022-11-11: qty 8, 28d supply, fill #3
  Filled 2023-01-18: qty 8, 28d supply, fill #4

## 2022-02-21 ENCOUNTER — Other Ambulatory Visit: Payer: Self-pay | Admitting: Internal Medicine

## 2022-02-21 ENCOUNTER — Other Ambulatory Visit (HOSPITAL_COMMUNITY): Payer: Self-pay

## 2022-02-21 DIAGNOSIS — T39095S Adverse effect of salicylates, sequela: Secondary | ICD-10-CM

## 2022-02-21 MED ORDER — PANTOPRAZOLE SODIUM 40 MG PO TBEC
40.0000 mg | DELAYED_RELEASE_TABLET | Freq: Every day | ORAL | 3 refills | Status: DC
  Filled 2022-02-21: qty 90, 90d supply, fill #0
  Filled 2022-05-19: qty 90, 90d supply, fill #1
  Filled 2022-08-16: qty 90, 90d supply, fill #2

## 2022-02-23 ENCOUNTER — Other Ambulatory Visit (HOSPITAL_COMMUNITY): Payer: Self-pay

## 2022-03-03 ENCOUNTER — Telehealth: Payer: Self-pay | Admitting: Internal Medicine

## 2022-03-03 ENCOUNTER — Other Ambulatory Visit (HOSPITAL_COMMUNITY): Payer: Self-pay

## 2022-03-03 NOTE — Telephone Encounter (Signed)
Copied from [place] [phone]. Topic: Medicare AWV >> Mar 03, 2022 10:00 AM Jae Dire wrote: Reason for CRM:  Left message for patient to call back and schedule Medicare Annual Wellness Visit (AWV) in office.   If unable to come into the office for AWV,  please offer to do virtually or by telephone.  No hx of AWVI eligible for AWVI per [place] as of 05/22/2009  Please schedule at anytime with [place].  45 minute appointment for in office or Initial virtual/phone  Any questions, please contact me at [phone]

## 2022-03-06 ENCOUNTER — Other Ambulatory Visit (HOSPITAL_COMMUNITY): Payer: Self-pay

## 2022-03-07 ENCOUNTER — Other Ambulatory Visit (HOSPITAL_COMMUNITY): Payer: Self-pay

## 2022-03-07 ENCOUNTER — Ambulatory Visit (INDEPENDENT_AMBULATORY_CARE_PROVIDER_SITE_OTHER): Payer: Medicare Other | Admitting: Internal Medicine

## 2022-03-07 ENCOUNTER — Encounter: Payer: Self-pay | Admitting: Internal Medicine

## 2022-03-07 VITALS — BP 112/74 | HR 91 | Temp 98.0°F | Ht 64.0 in | Wt 89.6 lb

## 2022-03-07 DIAGNOSIS — Z79899 Other long term (current) drug therapy: Secondary | ICD-10-CM

## 2022-03-07 DIAGNOSIS — K259 Gastric ulcer, unspecified as acute or chronic, without hemorrhage or perforation: Secondary | ICD-10-CM

## 2022-03-07 DIAGNOSIS — R5383 Other fatigue: Secondary | ICD-10-CM | POA: Diagnosis not present

## 2022-03-07 DIAGNOSIS — Z1231 Encounter for screening mammogram for malignant neoplasm of breast: Secondary | ICD-10-CM

## 2022-03-07 DIAGNOSIS — R0602 Shortness of breath: Secondary | ICD-10-CM | POA: Insufficient documentation

## 2022-03-07 DIAGNOSIS — Z9889 Other specified postprocedural states: Secondary | ICD-10-CM

## 2022-03-07 DIAGNOSIS — M0579 Rheumatoid arthritis with rheumatoid factor of multiple sites without organ or systems involvement: Secondary | ICD-10-CM

## 2022-03-07 DIAGNOSIS — E785 Hyperlipidemia, unspecified: Secondary | ICD-10-CM

## 2022-03-07 DIAGNOSIS — G894 Chronic pain syndrome: Secondary | ICD-10-CM

## 2022-03-07 DIAGNOSIS — R634 Abnormal weight loss: Secondary | ICD-10-CM

## 2022-03-07 DIAGNOSIS — R7301 Impaired fasting glucose: Secondary | ICD-10-CM

## 2022-03-07 DIAGNOSIS — T39395A Adverse effect of other nonsteroidal anti-inflammatory drugs [NSAID], initial encounter: Secondary | ICD-10-CM

## 2022-03-07 DIAGNOSIS — R0609 Other forms of dyspnea: Secondary | ICD-10-CM

## 2022-03-07 MED ORDER — ONDANSETRON HCL 4 MG PO TABS: ORAL_TABLET | ORAL | 4 refills | Status: DC

## 2022-03-07 MED ORDER — ACETAMINOPHEN-CODEINE 300-30 MG PO TABS: ORAL_TABLET | ORAL | 2 refills | Status: DC

## 2022-03-07 MED ORDER — BUTALBITAL-ASA-CAFF-CODEINE 50-325-40-30 MG PO CAPS: ORAL_CAPSULE | ORAL | 2 refills | Status: DC

## 2022-03-07 NOTE — Progress Notes (Signed)
Subjective:  Patient ID: Alexandra Lewis, female    DOB: 02/03/1956  Age: 66 y.o. MRN: [id number]  CC: The primary encounter diagnosis was Long-term use of high-risk medication. Diagnoses of Fatigue, unspecified type, Hyperlipidemia, unspecified hyperlipidemia type, Impaired fasting glucose, WEIGHT LOSS-ABNORMAL, Other fatigue, Breast cancer screening by mammogram, Dyspnea on exertion, Rheumatoid arthritis involving multiple sites with positive rheumatoid factor ([place]), Chronic pain disorder, Hx of pyloroplasty, and NSAID-induced gastric ulcer- pylorus were also pertinent to this visit.   HPI Alexandra Lewis presents for  Chief Complaint  Patient presents with   Follow-up    Follow up to fatigue and SOBr    Alexandra Lewis is a 66 yr old female with Rheumatoid Arthritis, history of lacunar infarct in 2013, statin myalgia , migraine disorder and chronic pain syndrome presents with  profound fatigue and exertional dyspnea.  Symptoms  have  become severe since increasing the frequency of  Enbrel dosing to weekly several weeks ago.  Wakes up  fatigued  and has DOE with simple ADL's,  endorses lower sternal chest pain but has a history of chronic gastritis .    Voice has become weak , denies nighttime reflux /cough. No orthopnea.. sleeps during the day,  does not sleep at night due to vivid night terrors.    Has tried seroquel,  did not help .    History of vagus nerve ligation many years ago  and pyloroplasty for gastric outlet obstruction. Chronically underweight:  Has lost 2 lbs since last visit. Eats One meal daily.  No vomiting but increased salivation with nausea . Uses zofran at 1/2 tablet because it causes hypoglycemic symptoms relieved with orange juice had similar response to compazine.   Prior cbgs during symptoms have been I the 70's  Chronic pain/narcotic dependence:  uses tylenol #3 and fiorinal to manage migraine pain and joint pain            Outpatient Medications Prior  to Visit  Medication Sig Dispense Refill   divalproex (DEPAKOTE) 125 MG DR tablet TAKE ONE TABLET BY MOUTH DAILY 90 tablet 2   ergocalciferol (VITAMIN D2) 1.25 MG (50000 UT) capsule Take 2 capsules (100,000 Units total) by mouth once a week 8 capsule 5   etanercept (ENBREL SURECLICK) 50 MG/ML injection Inject 50 mg into the skin once a week as directed. 4 mL 12   etanercept (ENBREL SURECLICK) 50 MG/ML injection Inject 1 mL (50 mg total) subcutaneously once a week 12 mL 1   Famotidine (PEPCID PO) Take by mouth.     folic acid (FOLVITE) 1 MG tablet Take 2 tablets by mouth daily. 90 tablet 3   INS SYRINGE/NEEDLE 1CC/28G (B-D INSULIN SYRINGE 1CC/28G) 28G X 1/2" 1 ML MISC For use with b12 injections 10 each 3   lidocaine-prilocaine (EMLA) cream Use as directed before injection. 30 g 0   lidocaine-prilocaine (EMLA) cream Apply 1 application topically as needed. 30 g 0   pantoprazole (PROTONIX) 40 MG tablet Take 1 tablet (40 mg total) by mouth daily. 90 tablet 3   SYRINGE-NEEDLE, DISP, 3 ML 25G X 1" 3 ML MISC Use to inject B12. 6 each 0   TUBERCULIN SYR 1CC/27GX1/2" (B-D TB SYRINGE 1CC/27GX1/2") 27G X 1/2" 1 ML MISC Use to inject methotrexate 10 each 1   ZOLMitriptan (ZOMIG) 2.5 MG tablet Take 1/2 to 1 tablet by mouth daily as needed for migraine headaches 8 tablet 5   acetaminophen-codeine (TYLENOL #3) 300-30 MG tablet TAKE ONE TABLET BY MOUTH  EVERY 6 HOURS AS NEEDED FOR MODERATE PAIN 120 tablet 2   butalbital-aspirin-caffeine-codeine (FIORINAL WITH CODEINE) 50-325-40-30 MG capsule TAKE TWO CAPSULES BY MOUTH EVERY 6 HOURS AS NEEDED FOR PAIN . MAX OF 6 CAPSULES DAILY. DO NOT FILL EARLIER THAN 30 DAYS 180 capsule 2   ondansetron (ZOFRAN) 4 MG tablet TAKE 1 TABLET BY MOUTH EVERY 8 HOURS AS NEEDED FOR NAUSEA OR VOMITING 60 tablet 4   calcitonin, salmon, (MIACALCIN) 200 UNIT/ACT nasal spray Place 1 spray into alternate nostrils daily. (Patient not taking: Reported on 03/07/2022) 3.7 mL 12   cephALEXin  (KEFLEX) 500 MG capsule Take 1 capsule (500 mg total) by mouth 2 (two) times daily. (Patient not taking: Reported on 03/07/2022) 14 capsule 0   mirabegron ER (MYRBETRIQ) 25 MG TB24 tablet TAKE 1 TABLET BY MOUTH ONCE A DAY (Patient not taking: Reported on 04/29/2021) 30 tablet 10   QUEtiapine (SEROQUEL) 25 MG tablet Take 1/2 tablet (12.5 mg total) by mouth at bedtime. (Patient not taking: Reported on 03/07/2022) 15 tablet 0   No facility-administered medications prior to visit.    Review of Systems;  Patient denies headache, fevers, malaise, unintentional weight loss, skin rash, eye pain, sinus congestion and sinus pain, sore throat, dysphagia,  hemoptysis , cough, dyspnea, wheezing, chest pain, palpitations, orthopnea, edema, abdominal pain, nausea, melena, diarrhea, constipation, flank pain, dysuria, hematuria, urinary  Frequency, nocturia, numbness, tingling, seizures,  Focal weakness, Loss of consciousness,  Tremor, insomnia, depression, anxiety, and suicidal ideation.      Objective:  BP 112/74 (BP Location: Left Arm, Patient Position: Sitting, Cuff Size: Normal)   Pulse 91   Temp 98 F (36.7 C) (Oral)   Ht '5\' 4"'$  (1.626 m)   Wt 89 lb 9.6 oz (40.6 kg)   SpO2 98%   BMI 15.38 kg/m   BP Readings from Last 3 Encounters:  03/07/22 112/74  04/29/21 110/62  11/05/20 112/74    Wt Readings from Last 3 Encounters:  03/07/22 89 lb 9.6 oz (40.6 kg)  04/29/21 91 lb 12.8 oz (41.6 kg)  11/05/20 94 lb 9.6 oz (42.9 kg)    General appearance: alert, cooperative and appears stated age Ears: normal TM's and external ear canals both ears Throat: lips, mucosa, and tongue normal; teeth and gums normal Neck: no adenopathy, no carotid bruit, supple, symmetrical, trachea midline and thyroid not enlarged, symmetric, no tenderness/mass/nodules Back: symmetric, no curvature. ROM normal. No CVA tenderness. Lungs: clear to auscultation bilaterally Heart: regular rate and rhythm, S1, S2 normal, no  murmur, click, rub or gallop Abdomen: soft, non-tender; bowel sounds normal; no masses,  no organomegaly Pulses: 2+ and symmetric Skin: Skin color, texture, turgor normal. No rashes or lesions Lymph nodes: Cervical, supraclavicular, and axillary nodes normal. Neuro:  awake and interactive with normal mood and affect. Higher cortical functions are normal. Speech is clear without word-finding difficulty or dysarthria. Extraocular movements are intact. Visual fields of both eyes are grossly intact. Sensation to light touch is grossly intact bilaterally of upper and lower extremities. Motor examination shows 4+/5 symmetric hand grip and upper extremity and 5/5 lower extremity strength. There is no pronation or drift. Gait is non-ataxic   No results found for: "HGBA1C"  Lab Results  Component Value Date   CREATININE 0.97 04/29/2021   CREATININE 0.97 02/25/2020   CREATININE 0.84 08/18/2019    Lab Results  Component Value Date   WBC 5.3 04/29/2021   HGB 12.4 04/29/2021   HCT 36.5 04/29/2021   PLT 246 04/29/2021  GLUCOSE 80 04/29/2021   CHOL 237 (H) 04/29/2021   TRIG 91 04/29/2021   HDL 104 04/29/2021   LDLDIRECT 112.0 12/29/2015   LDLCALC 114 (H) 04/29/2021   ALT 8 04/29/2021   AST 12 04/29/2021   NA 136 04/29/2021   K 4.3 04/29/2021   CL 99 04/29/2021   CREATININE 0.97 04/29/2021   BUN 16 04/29/2021   CO2 26 04/29/2021   TSH 3.95 04/29/2021    MR Brain W Wo Contrast  Result Date: 05/18/2015 CLINICAL DATA:  66 year old female with posterior headaches with visual aura. Symptoms for 6 months. Initial encounter. 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 EXAM: MRI HEAD WITHOUT AND WITH CONTRAST TECHNIQUE: Multiplanar, multiecho pulse sequences of the brain and surrounding structures were obtained without and with intravenous contrast. CONTRAST:  38m MULTIHANCE GADOBENATE DIMEGLUMINE 529 MG/ML IV SOLN COMPARISON:  Brain MRI 12/08/2011. FINDINGS: No restricted diffusion to  suggest acute infarction. No midline shift, mass effect, evidence of mass lesion, ventriculomegaly, extra-axial collection or acute intracranial hemorrhage. Cervicomedullary junction and pituitary are within normal limits. Negative visualized cervical spine. Major intracranial vascular flow voids are stable. Cerebral volume is stable since 2013. Expected evolution of the small posterior left corona radiata lacunar infarct. No associated hemosiderin. No cortical encephalomalacia, and elsewhere Stable and normal for age gray and white matter signal. No abnormal enhancement identified. No dural thickening identified. Visible internal auditory structures appear normal. Mastoids are clear. Mild paranasal sinus mucosal thickening is stable. Orbit and scalp soft tissues are within normal limits. IMPRESSION: 1.  No acute intracranial abnormality. 2. Negative for age MRI appearance of the brain aside from expected chronic appearance of the 2013 posterior left corona radiata lacunar infarct. Electronically Signed   By: HGenevie AnnM.D.   On: 05/18/2015 16:19    Assessment & Plan:   Problem List Items Addressed This Visit     Chronic pain disorder    Secondary to arthritis pain and frequent headaches/       Relevant Medications   butalbital-aspirin-caffeine-codeine (FIORINAL WITH CODEINE) 50-325-40-30 MG capsule   acetaminophen-codeine (TYLENOL #3) 300-30 MG tablet   Dyspnea on exertion    Need rule out  ischemic causes vs cardiomyopathy  .  Chest  Xray, cc to rule out severe anemia,  Cardiology referral made        Relevant Orders   DG Chest 2 View   Ambulatory referral to Cardiology   Fatigue   Relevant Orders   B12 and Folate Panel   IBC + Ferritin   TSH   CBC with Differential/Platelet   Hx of pyloroplasty   Long-term use of high-risk medication - Primary    Taking Enbrel.  CBC CMET ordered       Relevant Orders   TSH   CBC with Differential/Platelet   Comprehensive metabolic panel    NSAID-induced gastric ulcer- pylorus    She continues to employ fiorinal despite the risks of recurrent PUD because her pain is not controlled without the use of it.  Continue PPI bid       Rheumatoid arthritis ([place]    She has had multiple treatment failures due to drug intolerances.  Her RA is currently managed with weekly [facility]      Relevant Medications   butalbital-aspirin-caffeine-codeine (FIORINAL WITH CODEINE) 50-325-40-30 MG capsule   acetaminophen-codeine (TYLENOL #3) 300-30 MG tablet   WEIGHT LOSS-ABNORMAL   Other Visit Diagnoses     Hyperlipidemia, unspecified hyperlipidemia type  Relevant Orders   Lipid panel   LDL cholesterol, direct   Impaired fasting glucose       Relevant Orders   Comprehensive metabolic panel   Hemoglobin A1c   Breast cancer screening by mammogram       Relevant Orders   MM DIGITAL SCREENING BILATERAL       I spent a total of  40 minutes with this patient in a face to face visit on the date of this encounter reviewing the last office visit with me in       ,  most recent visit with cardiology  and eumatologi    ,  patient's diet and activity level, and post visit ordering of testing and therapeutics.    Follow-up: No follow-ups on file.   Crecencio Mc, MD

## 2022-03-07 NOTE — Assessment & Plan Note (Signed)
Taking Enbrel.  CBC CMET ordered

## 2022-03-07 NOTE — Assessment & Plan Note (Signed)
She continues to employ fiorinal despite the risks of recurrent PUD because her pain is not controlled without the use of it.  Continue PPI bid

## 2022-03-07 NOTE — Assessment & Plan Note (Addendum)
She has had multiple treatment failures due to drug intolerances.  Her RA is currently managed with weekly Enbrel, prescribed by [facility]

## 2022-03-07 NOTE — Assessment & Plan Note (Signed)
Need rule out  ischemic causes vs cardiomyopathy  .  Chest  Xray, cc to rule out severe anemia,  Cardiology referral made

## 2022-03-07 NOTE — Patient Instructions (Signed)
The mammogram has been ordered for [facility].  You can call to schedule  I will make a referral to Dr Rockey Situ for cardiac functional testing

## 2022-03-07 NOTE — Assessment & Plan Note (Signed)
Secondary to arthritis pain and frequent headaches/

## 2022-03-08 ENCOUNTER — Other Ambulatory Visit (HOSPITAL_COMMUNITY): Payer: Self-pay

## 2022-03-09 ENCOUNTER — Other Ambulatory Visit: Payer: Medicare Other

## 2022-03-11 ENCOUNTER — Other Ambulatory Visit: Payer: Self-pay | Admitting: Internal Medicine

## 2022-03-11 ENCOUNTER — Other Ambulatory Visit (HOSPITAL_COMMUNITY): Payer: Self-pay

## 2022-03-13 ENCOUNTER — Other Ambulatory Visit (HOSPITAL_COMMUNITY): Payer: Self-pay

## 2022-03-13 ENCOUNTER — Telehealth: Payer: Self-pay | Admitting: Internal Medicine

## 2022-03-13 MED FILL — Diazepam Tab 2 MG: ORAL | 30 days supply | Qty: 60 | Fill #0 | Status: AC

## 2022-03-13 NOTE — Telephone Encounter (Signed)
Is it okay for pt to have labs done at [facility] clinic next week?

## 2022-03-13 NOTE — Telephone Encounter (Signed)
Dr. Enrique Sack called stated that the patient was unable to have her labs drawn when she was here last week. He is requested that her labs be drawn at her appointment at [facility] next week. Can orders be faxed to them?

## 2022-03-15 NOTE — Telephone Encounter (Signed)
Spoke with pt's husband and he asked that we hold on sending the lab orders to [facility] because his wife is not feeling well and was not able to make her appt today. Husband stated that he will call us and let us know when and where to send the lab orders to.

## 2022-03-17 ENCOUNTER — Other Ambulatory Visit: Payer: Medicare Other

## 2022-03-21 ENCOUNTER — Other Ambulatory Visit: Payer: Medicare Other

## 2022-03-27 ENCOUNTER — Other Ambulatory Visit (HOSPITAL_COMMUNITY): Payer: Self-pay

## 2022-03-27 ENCOUNTER — Telehealth: Payer: Self-pay | Admitting: Internal Medicine

## 2022-03-27 MED ORDER — ENBREL SURECLICK 50 MG/ML ~~LOC~~ SOAJ
SUBCUTANEOUS | 1 refills | Status: DC
  Filled 2022-03-27: qty 12, 84d supply, fill #0
  Filled 2022-06-27: qty 4, 28d supply, fill #0
  Filled 2022-07-21 – 2022-08-01 (×2): qty 4, 28d supply, fill #1
  Filled 2022-09-07: qty 4, 28d supply, fill #2
  Filled 2022-12-25: qty 4, 28d supply, fill #3

## 2022-03-27 NOTE — Telephone Encounter (Signed)
Copied from [place]. Topic: Medicare AWV >> Mar 27, 2022  9:49 AM Devoria Glassing wrote: Reason for CRM: Left message for patient to schedule Annual Wellness Visit.  Please schedule with Nurse Health Advisor Denisa O'Brien-Blaney, LPN at [facility]. This appt can be telephone or office visit.  Please call [phone] ask for [facility]

## 2022-03-27 NOTE — Telephone Encounter (Signed)
Copied from [place]. Topic: Medicare AWV >> Mar 27, 2022  9:49 AM Devoria Glassing wrote: Reason for CRM: Left message for patient to schedule Annual Wellness Visit.  Please schedule with Nurse Health Advisor Denisa O'Brien-Blaney, LPN at [facility]. This appt can be telephone or office visit.  Please call [phone] ask for [facility]

## 2022-03-28 ENCOUNTER — Other Ambulatory Visit (HOSPITAL_COMMUNITY): Payer: Self-pay

## 2022-03-31 ENCOUNTER — Other Ambulatory Visit (HOSPITAL_COMMUNITY): Payer: Self-pay

## 2022-04-03 ENCOUNTER — Other Ambulatory Visit (HOSPITAL_COMMUNITY): Payer: Self-pay

## 2022-04-04 ENCOUNTER — Other Ambulatory Visit: Payer: Medicare Other

## 2022-04-05 ENCOUNTER — Other Ambulatory Visit (HOSPITAL_COMMUNITY): Payer: Self-pay

## 2022-04-07 ENCOUNTER — Other Ambulatory Visit (HOSPITAL_COMMUNITY): Payer: Self-pay

## 2022-04-11 ENCOUNTER — Other Ambulatory Visit (HOSPITAL_COMMUNITY): Payer: Self-pay

## 2022-04-12 ENCOUNTER — Other Ambulatory Visit (HOSPITAL_COMMUNITY): Payer: Self-pay

## 2022-04-14 ENCOUNTER — Other Ambulatory Visit (HOSPITAL_COMMUNITY): Payer: Self-pay

## 2022-04-19 ENCOUNTER — Ambulatory Visit: Payer: Medicare Other | Admitting: Internal Medicine

## 2022-04-19 ENCOUNTER — Other Ambulatory Visit (HOSPITAL_COMMUNITY): Payer: Self-pay

## 2022-04-21 ENCOUNTER — Other Ambulatory Visit (HOSPITAL_COMMUNITY): Payer: Self-pay

## 2022-04-25 ENCOUNTER — Ambulatory Visit (INDEPENDENT_AMBULATORY_CARE_PROVIDER_SITE_OTHER): Payer: Medicare Other

## 2022-04-25 ENCOUNTER — Other Ambulatory Visit (INDEPENDENT_AMBULATORY_CARE_PROVIDER_SITE_OTHER): Payer: Medicare Other

## 2022-04-25 ENCOUNTER — Telehealth: Payer: Self-pay

## 2022-04-25 DIAGNOSIS — R0609 Other forms of dyspnea: Secondary | ICD-10-CM

## 2022-04-25 DIAGNOSIS — R300 Dysuria: Secondary | ICD-10-CM | POA: Diagnosis not present

## 2022-04-25 DIAGNOSIS — E785 Hyperlipidemia, unspecified: Secondary | ICD-10-CM | POA: Diagnosis not present

## 2022-04-25 DIAGNOSIS — R5383 Other fatigue: Secondary | ICD-10-CM

## 2022-04-25 DIAGNOSIS — R7301 Impaired fasting glucose: Secondary | ICD-10-CM

## 2022-04-25 DIAGNOSIS — Z79899 Other long term (current) drug therapy: Secondary | ICD-10-CM | POA: Diagnosis not present

## 2022-04-25 NOTE — Telephone Encounter (Signed)
Can the future orders in pt's chart be added on to the blood that was drawn today?

## 2022-04-25 NOTE — Telephone Encounter (Signed)
Patient's husband, Cyara Devoto, called to state patient was just in our office and we did not take all of the labs that were ordered and he wondered why.

## 2022-04-26 LAB — CBC WITH DIFFERENTIAL/PLATELET
Basophils Absolute: 0.1 10*3/uL (ref 0.0–0.1)
Basophils Relative: 0.9 % (ref 0.0–3.0)
Eosinophils Absolute: 0.3 10*3/uL (ref 0.0–0.7)
Eosinophils Relative: 4.8 % (ref 0.0–5.0)
HCT: 37.5 % (ref 36.0–46.0)
Hemoglobin: 12.7 g/dL (ref 12.0–15.0)
Lymphocytes Relative: 34.4 % (ref 12.0–46.0)
Lymphs Abs: 2 10*3/uL (ref 0.7–4.0)
MCHC: 33.8 g/dL (ref 30.0–36.0)
MCV: 90.3 fl (ref 78.0–100.0)
Monocytes Absolute: 0.5 10*3/uL (ref 0.1–1.0)
Monocytes Relative: 8.9 % (ref 3.0–12.0)
Neutro Abs: 3 10*3/uL (ref 1.4–7.7)
Neutrophils Relative %: 51 % (ref 43.0–77.0)
Platelets: 236 10*3/uL (ref 150.0–400.0)
RBC: 4.15 Mil/uL (ref 3.87–5.11)
RDW: 12.3 % (ref 11.5–15.5)
WBC: 5.9 10*3/uL (ref 4.0–10.5)

## 2022-04-26 LAB — HEMOGLOBIN A1C: Hgb A1c MFr Bld: 5.9 % (ref 4.6–6.5)

## 2022-04-26 LAB — URINALYSIS, ROUTINE W REFLEX MICROSCOPIC
Bilirubin Urine: NEGATIVE
Hgb urine dipstick: NEGATIVE
Ketones, ur: NEGATIVE
Leukocytes,Ua: NEGATIVE
Nitrite: NEGATIVE
Specific Gravity, Urine: 1.01 (ref 1.000–1.030)
Total Protein, Urine: NEGATIVE
Urine Glucose: NEGATIVE
Urobilinogen, UA: 0.2 (ref 0.0–1.0)
pH: 7.5 (ref 5.0–8.0)

## 2022-04-26 LAB — B12 AND FOLATE PANEL
Folate: 14.7 ng/mL (ref >5.9)
Vitamin B-12: 464 pg/mL (ref 211–911)

## 2022-04-26 LAB — COMPREHENSIVE METABOLIC PANEL
ALT: 9 U/L (ref 0–35)
AST: 11 U/L (ref 0–37)
Albumin: 4.4 g/dL (ref 3.5–5.2)
Alkaline Phosphatase: 74 U/L (ref 39–117)
BUN: 17 mg/dL (ref 6–23)
CO2: 28 mEq/L (ref 19–32)
Calcium: 9.1 mg/dL (ref 8.4–10.5)
Chloride: 98 mEq/L (ref 96–112)
Creatinine, Ser: 1.22 mg/dL — ABNORMAL HIGH (ref 0.40–1.20)
GFR: 46.54 mL/min — ABNORMAL LOW (ref >60.00)
Glucose, Bld: 131 mg/dL — ABNORMAL HIGH (ref 70–99)
Potassium: 3.8 mEq/L (ref 3.5–5.1)
Sodium: 135 mEq/L (ref 135–145)
Total Bilirubin: 0.3 mg/dL (ref 0.2–1.2)
Total Protein: 6 g/dL (ref 6.0–8.3)

## 2022-04-26 LAB — LDL CHOLESTEROL, DIRECT: Direct LDL: 115 mg/dL

## 2022-04-26 LAB — URINE CULTURE
MICRO NUMBER:: 14272387
Result:: NO GROWTH
SPECIMEN QUALITY:: ADEQUATE

## 2022-04-26 LAB — LIPID PANEL
Cholesterol: 248 mg/dL — ABNORMAL HIGH (ref 0–200)
HDL: 106.5 mg/dL (ref >39.00)
LDL Cholesterol: 123 mg/dL — ABNORMAL HIGH (ref 0–99)
NonHDL: 141.86
Total CHOL/HDL Ratio: 2
Triglycerides: 96 mg/dL (ref 0.0–149.0)
VLDL: 19.2 mg/dL (ref 0.0–40.0)

## 2022-04-26 LAB — IBC + FERRITIN
Ferritin: 18.1 ng/mL (ref 10.0–291.0)
Iron: 151 ug/dL — ABNORMAL HIGH (ref 42–145)
Saturation Ratios: 50.9 % — ABNORMAL HIGH (ref 20.0–50.0)
TIBC: 296.8 ug/dL (ref 250.0–450.0)
Transferrin: 212 mg/dL (ref 212.0–360.0)

## 2022-04-26 LAB — TSH: TSH: 2.73 u[IU]/mL (ref 0.35–5.50)

## 2022-04-26 NOTE — Telephone Encounter (Signed)
Spoke with pt's husband to let him know that the labs that weren't drawn yesterday were able to be added on to the blood that was drawn yesterday. Husband gave a verbal understanding.

## 2022-04-30 ENCOUNTER — Other Ambulatory Visit (HOSPITAL_COMMUNITY): Payer: Self-pay

## 2022-05-01 ENCOUNTER — Other Ambulatory Visit (HOSPITAL_COMMUNITY): Payer: Self-pay

## 2022-05-02 ENCOUNTER — Other Ambulatory Visit (HOSPITAL_COMMUNITY): Payer: Self-pay

## 2022-05-03 ENCOUNTER — Other Ambulatory Visit (HOSPITAL_COMMUNITY): Payer: Self-pay

## 2022-05-03 NOTE — Telephone Encounter (Signed)
MyChart messgae sent to patient. 

## 2022-05-04 ENCOUNTER — Other Ambulatory Visit (HOSPITAL_COMMUNITY): Payer: Self-pay

## 2022-05-05 ENCOUNTER — Telehealth: Payer: Medicare Other | Admitting: Internal Medicine

## 2022-05-05 ENCOUNTER — Other Ambulatory Visit: Payer: Self-pay

## 2022-05-08 ENCOUNTER — Other Ambulatory Visit: Payer: Self-pay | Admitting: Internal Medicine

## 2022-05-08 MED ORDER — ALBUTEROL SULFATE HFA 108 (90 BASE) MCG/ACT IN AERS: 2.0000 | INHALATION_SPRAY | Freq: Four times a day (QID) | RESPIRATORY_TRACT | 2 refills | Status: DC | PRN

## 2022-05-10 ENCOUNTER — Ambulatory Visit: Payer: Medicare Other | Admitting: Dermatology

## 2022-05-11 ENCOUNTER — Telehealth: Payer: Self-pay | Admitting: Internal Medicine

## 2022-05-11 NOTE — Telephone Encounter (Signed)
Spoke with patient spouse he stated he will give her the message so she can all back to sched her AWV

## 2022-05-19 ENCOUNTER — Other Ambulatory Visit (HOSPITAL_COMMUNITY): Payer: Self-pay

## 2022-05-29 NOTE — Progress Notes (Deleted)
Cardiology Office Note  Date:  05/29/2022   ID:  Alexandra Lewis, DOB 1955/11/04, MRN [id number]  PCP:  Crecencio Mc, MD   No chief complaint on file.   HPI:  [name] is a 67 year old woman with past medical history of Hyperlipidemia Rheumatoid arthritis chronic pain disorder Lacunar infarct 2013 Statin intolerance History of vagus nerve ligation many years ago  and pyloroplasty for gastric outlet obstruction. Chronically underweight:  Migraines, on chronic pain medication Who presents by referral from Dr. Derrel Nip for shortness of breath  Recently seen by primary care March 07, 2022  profound fatigue and exertional dyspnea   Wakes up  fatigued  and has DOE with simple ADL's,  endorses lower sternal chest pain but has a history of chronic gastritis .    Voice has become weak , denies nighttime reflux /cough. No orthopnea.. sleeps during the day,  does not sleep at night due to vivid night terrors.    Has tried seroquel,  did not help .       PMH:   has a past medical history of Adverse effect of salicylate- ulcer (40/01/8118), Chronic back pain, Chronic Migraine Headaches, CVA (cerebrovascular accident) ([place]) (12/05/11), Depression, Dysplastic nevus (06/2011), GERD (gastroesophageal reflux disease), History of dysplastic nevus (08/10/2011), History of peptic ulcer disease, Loss of weight, NSAID-induced gastric ulcer- pylorus (01/17/2013), Osteoporosis, Partial gastric outlet obstruction (07/04/2006), Postgastric surgery syndromes, and Pyloric stenosis (11/25/2008).  PSH:    Past Surgical History:  Procedure Laterality Date   BREAST SURGERY     ESOPHAGOGASTRODUODENOSCOPY  04/13/2009   S/P EGD with dilitation of pylorus-Dr. Watt Climes   ESOPHAGOGASTRODUODENOSCOPY  11/25/2008   Status post EGD with dilatation Dr. Watt Climes   ESOPHAGOGASTRODUODENOSCOPY  07/04/2006   S/P EGD and Bx-Dr. [place]   Right foot   Pyloroplasty with Vagotomy  06/29/09    S/P pyloroplasty with laparoscopic truncal vagotomy and hiatal hernia repair-07/19/2009 Dr. Hassell Done   SEPTOPLASTY  1982   SKIN BIOPSY     TOTAL ABDOMINAL HYSTERECTOMY  2000    [facility]-Dr. DeFrancesco    Current Outpatient Medications  Medication Sig Dispense Refill   acetaminophen-codeine (TYLENOL #3) 300-30 MG tablet TAKE ONE TABLET BY MOUTH EVERY 6 HOURS AS NEEDED FOR MODERATE PAIN 120 tablet 2   albuterol (VENTOLIN HFA) 108 (90 Base) MCG/ACT inhaler Inhale 2 puffs into the lungs every 6 (six) hours as needed for wheezing or shortness of breath. 8 g 2   butalbital-aspirin-caffeine-codeine (FIORINAL WITH CODEINE) 50-325-40-30 MG capsule TAKE TWO CAPSULES BY MOUTH EVERY 6 HOURS AS NEEDED FOR PAIN . MAX OF 6 CAPSULES DAILY. DO NOT FILL EARLIER THAN 30 DAYS 180 capsule 2   diazepam (VALIUM) 2 MG tablet Take 1 tablet (2 mg total) by mouth every 12 (twelve) hours as needed. for anxiety 60 tablet 5   divalproex (DEPAKOTE) 125 MG DR tablet TAKE ONE TABLET BY MOUTH DAILY 90 tablet 2   ergocalciferol (VITAMIN D2) 1.25 MG (50000 UT) capsule Take 2 capsules (100,000 Units total) by mouth once a week 8 capsule 5   etanercept (ENBREL SURECLICK) 50 MG/ML injection Inject 50 mg into the skin once a week as directed. 4 mL 12   etanercept (ENBREL SURECLICK) 50 MG/ML injection Inject 1 mL (50 mg total) subcutaneously once a week 12 mL 1   Famotidine (PEPCID PO) Take by mouth.     folic acid (FOLVITE) 1 MG tablet Take 2 tablets by mouth daily. 90 tablet 3  INS SYRINGE/NEEDLE 1CC/28G (B-D INSULIN SYRINGE 1CC/28G) 28G X 1/2" 1 ML MISC For use with b12 injections 10 each 3   lidocaine-prilocaine (EMLA) cream Use as directed before injection. 30 g 0   lidocaine-prilocaine (EMLA) cream Apply 1 application topically as needed. 30 g 0   ondansetron (ZOFRAN) 4 MG tablet TAKE 1 TABLET BY MOUTH EVERY 8 HOURS AS NEEDED FOR NAUSEA OR VOMITING 30 tablet 4   pantoprazole (PROTONIX) 40 MG tablet Take 1 tablet (40 mg  total) by mouth daily. 90 tablet 3   SYRINGE-NEEDLE, DISP, 3 ML 25G X 1" 3 ML MISC Use to inject B12. 6 each 0   TUBERCULIN SYR 1CC/27GX1/2" (B-D TB SYRINGE 1CC/27GX1/2") 27G X 1/2" 1 ML MISC Use to inject methotrexate 10 each 1   ZOLMitriptan (ZOMIG) 2.5 MG tablet Take 1/2 to 1 tablet by mouth daily as needed for migraine headaches 8 tablet 5   No current facility-administered medications for this visit.     Allergies:   Methotrexate, Promethazine hcl, Sumatriptan, Celecoxib, Rizatriptan, and Tape   Social History:  The patient  reports that she has never smoked. She has never used smokeless tobacco. She reports that she does not drink alcohol and does not use drugs.   Family History:   family history includes Diabetes in her mother; GER disease in her brother, father, sister, and sister; Hypertension in her mother.    Review of Systems: ROS   PHYSICAL EXAM: VS:  There were no vitals taken for this visit. , BMI There is no height or weight on file to calculate BMI. GEN: Well nourished, well developed, in no acute distress HEENT: normal Neck: no JVD, carotid bruits, or masses Cardiac: RRR; no murmurs, rubs, or gallops,no edema  Respiratory:  clear to auscultation bilaterally, normal work of breathing GI: soft, nontender, nondistended, + BS MS: no deformity or atrophy Skin: warm and dry, no rash Neuro:  Strength and sensation are intact Psych: euthymic mood, full affect    Recent Labs: 04/25/2022: ALT 9; BUN 17; Creatinine, Ser 1.22; Hemoglobin 12.7; Platelets 236.0; Potassium 3.8; Sodium 135; TSH 2.73    Lipid Panel Lab Results  Component Value Date   CHOL 248 (H) 04/25/2022   HDL 106.50 04/25/2022   LDLCALC 123 (H) 04/25/2022   TRIG 96.0 04/25/2022      Wt Readings from Last 3 Encounters:  03/07/22 89 lb 9.6 oz (40.6 kg)  04/29/21 91 lb 12.8 oz (41.6 kg)  11/05/20 94 lb 9.6 oz (42.9 kg)       ASSESSMENT AND PLAN:  Problem List Items Addressed This Visit    None    Disposition:   F/U  12 months   Total encounter time more than 30 minutes  Greater than 50% was spent in counseling and coordination of care with the patient    Signed, Esmond Plants, M.D., Ph.D. [place], [place]

## 2022-05-30 ENCOUNTER — Ambulatory Visit: Payer: Medicare Other | Admitting: Cardiovascular Disease

## 2022-05-30 DIAGNOSIS — G894 Chronic pain syndrome: Secondary | ICD-10-CM

## 2022-05-30 DIAGNOSIS — R0602 Shortness of breath: Secondary | ICD-10-CM

## 2022-06-02 ENCOUNTER — Other Ambulatory Visit (HOSPITAL_COMMUNITY): Payer: Self-pay

## 2022-06-06 ENCOUNTER — Telehealth: Payer: Medicare Other | Admitting: Internal Medicine

## 2022-06-21 ENCOUNTER — Telehealth: Payer: Self-pay | Admitting: Internal Medicine

## 2022-06-21 NOTE — Telephone Encounter (Signed)
Patient w/ abdominal pain issues and hemorrhoids  Husband (ron, DDS) contacted me about an appointment.  She needs an afternoon appointment preferably in the later portion of schedule  You can see if they want 350 2/9 otherwise will need to go later in Feb  thanks

## 2022-06-22 ENCOUNTER — Other Ambulatory Visit: Payer: Self-pay | Admitting: Internal Medicine

## 2022-06-22 ENCOUNTER — Encounter: Payer: Self-pay | Admitting: Internal Medicine

## 2022-06-22 ENCOUNTER — Telehealth: Payer: Self-pay

## 2022-06-22 DIAGNOSIS — K649 Unspecified hemorrhoids: Secondary | ICD-10-CM

## 2022-06-22 DIAGNOSIS — R1013 Epigastric pain: Secondary | ICD-10-CM

## 2022-06-22 DIAGNOSIS — R109 Unspecified abdominal pain: Secondary | ICD-10-CM

## 2022-06-22 NOTE — Telephone Encounter (Signed)
Noted  

## 2022-06-22 NOTE — Telephone Encounter (Signed)
Patient's husband, Tanner Vigna (Dr. Enrique Sack), called to state patient is having abdominal discomfort and he would like for Dr. Deborra Medina to give her a referral to a GI doctor.  I let Dr. Enrique Sack know that Dr. Derrel Nip would need to see the patient before she could refer her to another provider.  Patient has an appointment scheduled for tomorrow with Dr. Derrel Nip.  Dr. Enrique Sack asked that I please check with Dr. Derrel Nip to see if it would be possible to have the referral without patient coming in to see Dr. Derrel Nip.  Please call.

## 2022-06-22 NOTE — Telephone Encounter (Signed)
See mychart message. Referral has been pended for your approval.

## 2022-06-22 NOTE — Telephone Encounter (Signed)
Pended referral for your approval.

## 2022-06-22 NOTE — Telephone Encounter (Signed)
Spoke with pt husband Ron: Pt scheduled for an office visit with Dr. Carlean Purl on 06/29/2022 at 3:50 pm: Ron made aware: Ron verbalized understanding with all questions answered.

## 2022-06-22 NOTE — Telephone Encounter (Signed)
She is scheduled with you tomorrow in person.

## 2022-06-23 ENCOUNTER — Ambulatory Visit: Payer: Medicare Other | Admitting: Internal Medicine

## 2022-06-27 ENCOUNTER — Other Ambulatory Visit (HOSPITAL_COMMUNITY): Payer: Self-pay

## 2022-06-27 ENCOUNTER — Other Ambulatory Visit: Payer: Self-pay

## 2022-06-29 ENCOUNTER — Telehealth: Payer: Self-pay | Admitting: Internal Medicine

## 2022-06-29 ENCOUNTER — Ambulatory Visit: Payer: Medicare Other | Admitting: Internal Medicine

## 2022-06-29 NOTE — Telephone Encounter (Signed)
PT spouse called to reschedule appointment for today with Dr. Carlean Purl. It is a new patient appointment and it does not give me any available dates for the provider. Needs to be a late afternoon appointment. Please advise.

## 2022-06-29 NOTE — Telephone Encounter (Signed)
Alexandra Lewis when you are able you can reschedule her for a 350 appointment or if there is an afternoon appointment.  We do not need a new patient's slot.  Thank you

## 2022-06-29 NOTE — Telephone Encounter (Signed)
Spoke with Pt husband Jori Moll.  Pt was scheudled for 07/11/2022 at 3:50 PM to see Dr. Carlean Purl. Jori Moll made aware Jori Moll verbalized understanding with all questions answered.

## 2022-07-07 ENCOUNTER — Other Ambulatory Visit (HOSPITAL_COMMUNITY): Payer: Self-pay

## 2022-07-10 ENCOUNTER — Other Ambulatory Visit (HOSPITAL_COMMUNITY): Payer: Self-pay

## 2022-07-10 ENCOUNTER — Other Ambulatory Visit: Payer: Self-pay | Admitting: Internal Medicine

## 2022-07-11 ENCOUNTER — Ambulatory Visit: Payer: Medicare Other | Admitting: Internal Medicine

## 2022-07-17 NOTE — Progress Notes (Deleted)
Cardiology Office Note  Date:  07/17/2022   ID:  Alexandra Lewis, DOB 24-Jul-1955, MRN [id number]  PCP:  Crecencio Mc, MD   No chief complaint on file.   HPI:  [name] is a 67 year old woman with past medical history of Rheumatoid arthritis lacunar infarct in 2013,  Hyperlipidemia with statin myalgia ,   vagus nerve ligation many years ago  and pyloroplasty for gastric outlet obstruction.   migraine disorder and chronic pain syndrome  Who presents by referral from [place] for dyspnea on exertion   Wakes up  fatigued  and has DOE with simple ADL's,  endorses lower sternal chest pain but has a history of chronic gastritis .  Eats One meal daily.   Chronic pain/narcotic dependence: uses tylenol #3 and fiorinal to manage migraine pain and joint pain   Essentially normal echocardiogram July 2020  PMH:   has a past medical history of Adverse effect of salicylate- ulcer (Q000111Q), Chronic back pain, Chronic Migraine Headaches, CVA (cerebrovascular accident) ([place]) (12/05/11), Depression, Dysplastic nevus (06/2011), GERD (gastroesophageal reflux disease), History of dysplastic nevus (08/10/2011), History of peptic ulcer disease, Loss of weight, NSAID-induced gastric ulcer- pylorus (01/17/2013), Osteoporosis, Partial gastric outlet obstruction (07/04/2006), Postgastric surgery syndromes, and Pyloric stenosis (11/25/2008).  PSH:    Past Surgical History:  Procedure Laterality Date   BREAST SURGERY     ESOPHAGOGASTRODUODENOSCOPY  04/13/2009   S/P EGD with dilitation of pylorus-Dr. Watt Climes   ESOPHAGOGASTRODUODENOSCOPY  11/25/2008   Status post EGD with dilatation Dr. Watt Climes   ESOPHAGOGASTRODUODENOSCOPY  07/04/2006   S/P EGD and Bx-Dr. [place]   Right foot   Pyloroplasty with Vagotomy  06/29/09   S/P pyloroplasty with laparoscopic truncal vagotomy and hiatal hernia repair-07/19/2009 Dr. Hassell Done   SEPTOPLASTY  1982   SKIN BIOPSY     TOTAL  ABDOMINAL HYSTERECTOMY  2000   [facility]-Dr. DeFrancesco    Current Outpatient Medications  Medication Sig Dispense Refill   acetaminophen-codeine (TYLENOL #3) 300-30 MG tablet TAKE 1 TABLET BY MOUTH EVERY 6 HOURS AS NEEDED FOR MODERATE PAIN 120 tablet 2   albuterol (VENTOLIN HFA) 108 (90 Base) MCG/ACT inhaler Inhale 2 puffs into the lungs every 6 (six) hours as needed for wheezing or shortness of breath. 8 g 2   butalbital-aspirin-caffeine-codeine (FIORINAL WITH CODEINE) 50-325-40-30 MG capsule TAKE TWO CAPSULES BY MOUTH EVERY 6 HOURS AS NEEDED PAIN. MAX OF 6 CAPSULES DAILY. *DO NOT FILL EARLIER THAN 30 DAYS* 180 capsule 2   diazepam (VALIUM) 2 MG tablet Take 1 tablet (2 mg total) by mouth every 12 (twelve) hours as needed. for anxiety 60 tablet 5   divalproex (DEPAKOTE) 125 MG DR tablet TAKE ONE TABLET BY MOUTH DAILY 90 tablet 2   ergocalciferol (VITAMIN D2) 1.25 MG (50000 UT) capsule Take 2 capsules (100,000 Units total) by mouth once a week 8 capsule 5   etanercept (ENBREL SURECLICK) 50 MG/ML injection Inject 50 mg into the skin once a week as directed. 4 mL 12   etanercept (ENBREL SURECLICK) 50 MG/ML injection Inject 1 mL (50 mg total) subcutaneously once a week 12 mL 1   Famotidine (PEPCID PO) Take by mouth.     folic acid (FOLVITE) 1 MG tablet Take 2 tablets by mouth daily. 90 tablet 3   INS SYRINGE/NEEDLE 1CC/28G (B-D INSULIN SYRINGE 1CC/28G) 28G X 1/2" 1 ML MISC For use with b12 injections 10 each 3   lidocaine-prilocaine (EMLA) cream Use as directed before  injection. 30 g 0   lidocaine-prilocaine (EMLA) cream Apply 1 application topically as needed. 30 g 0   ondansetron (ZOFRAN) 4 MG tablet TAKE 1 TABLET BY MOUTH EVERY 8 HOURS AS NEEDED FOR NAUSEA OR VOMITING 30 tablet 4   pantoprazole (PROTONIX) 40 MG tablet Take 1 tablet (40 mg total) by mouth daily. 90 tablet 3   SYRINGE-NEEDLE, DISP, 3 ML 25G X 1" 3 ML MISC Use to inject B12. 6 each 0   TUBERCULIN SYR 1CC/27GX1/2" (B-D TB  SYRINGE 1CC/27GX1/2") 27G X 1/2" 1 ML MISC Use to inject methotrexate 10 each 1   ZOLMitriptan (ZOMIG) 2.5 MG tablet Take 1/2 to 1 tablet by mouth daily as needed for migraine headaches 8 tablet 5   No current facility-administered medications for this visit.     Allergies:   Methotrexate, Promethazine hcl, Sumatriptan, Celecoxib, Rizatriptan, and Tape   Social History:  The patient  reports that she has never smoked. She has never used smokeless tobacco. She reports that she does not drink alcohol and does not use drugs.   Family History:   family history includes Diabetes in her mother; GER disease in her brother, father, sister, and sister; Hypertension in her mother.    Review of Systems: ROS   PHYSICAL EXAM: VS:  There were no vitals taken for this visit. , BMI There is no height or weight on file to calculate BMI. GEN: Well nourished, well developed, in no acute distress HEENT: normal Neck: no JVD, carotid bruits, or masses Cardiac: RRR; no murmurs, rubs, or gallops,no edema  Respiratory:  clear to auscultation bilaterally, normal work of breathing GI: soft, nontender, nondistended, + BS MS: no deformity or atrophy Skin: warm and dry, no rash Neuro:  Strength and sensation are intact Psych: euthymic mood, full affect    Recent Labs: 04/25/2022: ALT 9; BUN 17; Creatinine, Ser 1.22; Hemoglobin 12.7; Platelets 236.0; Potassium 3.8; Sodium 135; TSH 2.73    Lipid Panel Lab Results  Component Value Date   CHOL 248 (H) 04/25/2022   HDL 106.50 04/25/2022   LDLCALC 123 (H) 04/25/2022   TRIG 96.0 04/25/2022      Wt Readings from Last 3 Encounters:  03/07/22 89 lb 9.6 oz (40.6 kg)  04/29/21 91 lb 12.8 oz (41.6 kg)  11/05/20 94 lb 9.6 oz (42.9 kg)       ASSESSMENT AND PLAN:  Problem List Items Addressed This Visit   None    Disposition:   F/U  12 months   Total encounter time more than 30 minutes  Greater than 50% was spent in counseling and coordination of  care with the patient    Signed, Esmond Plants, M.D., Ph.D. [place], [place]

## 2022-07-18 ENCOUNTER — Other Ambulatory Visit (HOSPITAL_COMMUNITY): Payer: Self-pay

## 2022-07-18 ENCOUNTER — Ambulatory Visit: Payer: Medicare Other | Admitting: Cardiovascular Disease

## 2022-07-18 DIAGNOSIS — R0602 Shortness of breath: Secondary | ICD-10-CM

## 2022-07-18 DIAGNOSIS — T466X5A Adverse effect of antihyperlipidemic and antiarteriosclerotic drugs, initial encounter: Secondary | ICD-10-CM

## 2022-07-18 DIAGNOSIS — G894 Chronic pain syndrome: Secondary | ICD-10-CM

## 2022-07-18 DIAGNOSIS — M0579 Rheumatoid arthritis with rheumatoid factor of multiple sites without organ or systems involvement: Secondary | ICD-10-CM

## 2022-07-21 ENCOUNTER — Other Ambulatory Visit (HOSPITAL_COMMUNITY): Payer: Self-pay

## 2022-07-24 ENCOUNTER — Other Ambulatory Visit: Payer: Self-pay | Admitting: Internal Medicine

## 2022-07-27 ENCOUNTER — Telehealth: Payer: Self-pay | Admitting: Internal Medicine

## 2022-07-27 NOTE — Telephone Encounter (Signed)
Copied from [place]. Topic: Medicare AWV >> Jul 27, 2022 10:27 AM Lollie Marrow wrote: Reason for CRM: Called patient to schedule Medicare Annual Wellness Visit (AWV). Left message for patient to call back and schedule Medicare Annual Wellness Visit (AWV).  Last date of AWV: AWVI eligible as of 05/22/2009  Please schedule an appointment at any time with Denisa, [place].  If any questions, please contact me at [phone].    Thank you,  [place] Direct dial  [phone]

## 2022-07-28 ENCOUNTER — Other Ambulatory Visit (HOSPITAL_COMMUNITY): Payer: Self-pay

## 2022-07-28 ENCOUNTER — Other Ambulatory Visit: Payer: Self-pay

## 2022-07-31 ENCOUNTER — Other Ambulatory Visit (HOSPITAL_COMMUNITY): Payer: Self-pay

## 2022-08-01 ENCOUNTER — Other Ambulatory Visit (HOSPITAL_COMMUNITY): Payer: Self-pay

## 2022-08-02 ENCOUNTER — Other Ambulatory Visit: Payer: Self-pay

## 2022-08-03 ENCOUNTER — Other Ambulatory Visit: Payer: Self-pay | Admitting: Internal Medicine

## 2022-08-03 ENCOUNTER — Other Ambulatory Visit (HOSPITAL_COMMUNITY): Payer: Self-pay

## 2022-08-03 NOTE — Telephone Encounter (Signed)
Refilled: 04/29/2021 Last OV: 03/07/2022 Next OV: 08/14/2022

## 2022-08-09 ENCOUNTER — Other Ambulatory Visit: Payer: Self-pay

## 2022-08-09 ENCOUNTER — Ambulatory Visit: Payer: Medicare Other | Admitting: Dermatology

## 2022-08-09 VITALS — BP 138/75 | HR 80

## 2022-08-09 DIAGNOSIS — D1801 Hemangioma of skin and subcutaneous tissue: Secondary | ICD-10-CM | POA: Diagnosis not present

## 2022-08-09 DIAGNOSIS — L299 Pruritus, unspecified: Secondary | ICD-10-CM

## 2022-08-09 DIAGNOSIS — L821 Other seborrheic keratosis: Secondary | ICD-10-CM | POA: Diagnosis not present

## 2022-08-09 DIAGNOSIS — L820 Inflamed seborrheic keratosis: Secondary | ICD-10-CM | POA: Diagnosis not present

## 2022-08-09 DIAGNOSIS — L814 Other melanin hyperpigmentation: Secondary | ICD-10-CM

## 2022-08-09 MED ORDER — MOMETASONE FUROATE 0.1 % EX CREA: TOPICAL_CREAM | CUTANEOUS | 2 refills | Status: DC

## 2022-08-09 NOTE — Progress Notes (Signed)
   Follow-Up Visit   Subjective  Alexandra Lewis is a 67 y.o. female who presents for the following: Pruritis (Growths have come up more since starting Enbrel 1 year ago for RA. No new soaps. Uses Aveeno Oatmeal. She has taken Benadryl as needed for itch. ).   The following portions of the chart were reviewed this encounter and updated as appropriate:       Review of Systems:  No other skin or systemic complaints except as noted in HPI or Assessment and Plan.  Objective  Well appearing patient in no apparent distress; mood and affect are within normal limits.  A focused examination was performed including face, trunk, extremities. Relevant physical exam findings are noted in the Assessment and Plan.  L thigh, R shoulder, inframammary Erythematous stuck-on, waxy papule    Assessment & Plan  Seborrheic Keratoses - Stuck-on, waxy, tan-brown papules and/or plaques  - Benign-appearing - Discussed benign etiology and prognosis. - Observe - Call for any changes  Hemangiomas - Red papules - Discussed benign nature - Observe - Call for any changes  Lentigines - Scattered tan macules - Due to sun exposure - Benign-appearing, observe - Recommend daily broad spectrum sunscreen SPF 30+ to sun-exposed areas, reapply every 2 hours as needed. - Call for any changes   Inflamed seborrheic keratosis L thigh, R shoulder, inframammary  With Pruritus. Symptomatic, irritating, patient defers cryotherapy treatment today. Would prefer topical to treat for itch.   Start mometasone cream - Apply to AA QD/BID prn itch dsp 45g 2Rf. Avoid long-term use in skin folds, no longer than 1 week.  Topical steroids (such as triamcinolone, fluocinolone, fluocinonide, mometasone, clobetasol, halobetasol, betamethasone, hydrocortisone) can cause thinning and lightening of the skin if they are used for too long in the same area. Your physician has selected the right strength medicine for your problem and  area affected on the body. Please use your medication only as directed by your physician to prevent side effects.   Recommend mild soap and moisturizing cream 1-2 times daily.  Gentle skin care handout provided.    mometasone (ELOCON) 0.1 % cream - L thigh, R shoulder, inframammary Apply to itchy spots on body once to twice daily as needed for itch.  Pruritus   Return if symptoms worsen or fail to improve.  IJamesetta Orleans, CMA, am acting as scribe for Brendolyn Patty, MD .  Documentation: I have reviewed the above documentation for accuracy and completeness, and I agree with the above.  Brendolyn Patty MD

## 2022-08-09 NOTE — Patient Instructions (Addendum)
Start mometasone cream - Apply to itchy spots on body once to twice daily as needed for itch. May use in skin folds under breasts, but no longer than 1 week at a time.  Topical steroids (such as triamcinolone, fluocinolone, fluocinonide, mometasone, clobetasol, halobetasol, betamethasone, hydrocortisone) can cause thinning and lightening of the skin if they are used for too long in the same area. Your physician has selected the right strength medicine for your problem and area affected on the body. Please use your medication only as directed by your physician to prevent side effects.    Seborrheic Keratosis  What causes seborrheic keratoses? Seborrheic keratoses are harmless, common skin growths that first appear during adult life.  As time goes by, more growths appear.  Some people may develop a large number of them.  Seborrheic keratoses appear on both covered and uncovered body parts.  They are not caused by sunlight.  The tendency to develop seborrheic keratoses can be inherited.  They vary in color from skin-colored to gray, brown, or even black.  They can be either smooth or have a rough, warty surface.   Seborrheic keratoses are superficial and look as if they were stuck on the skin.  Under the microscope this type of keratosis looks like layers upon layers of skin.  That is why at times the top layer may seem to fall off, but the rest of the growth remains and re-grows.    Treatment Seborrheic keratoses do not need to be treated, but can easily be removed in the office.  Seborrheic keratoses often cause symptoms when they rub on clothing or jewelry.  Lesions can be in the way of shaving.  If they become inflamed, they can cause itching, soreness, or burning.  Removal of a seborrheic keratosis can be accomplished by freezing, burning, or surgery. If any spot bleeds, scabs, or grows rapidly, please return to have it checked, as these can be an indication of a skin cancer.   Gentle Skin Care  Guide  1. Bathe no more than once a day.  2. Avoid bathing in hot water  3. Use a mild soap like Dove, Vanicream, Cetaphil, CeraVe. Can use Lever 2000 or Cetaphil antibacterial soap  4. Use soap only where you need it. On most days, use it under your arms, between your legs, and on your feet. Let the water rinse other areas unless visibly dirty.  5. When you get out of the bath/shower, use a towel to gently blot your skin dry, don't rub it.  6. While your skin is still a little damp, apply a moisturizing cream such as Vanicream, CeraVe, Cetaphil, Eucerin, Sarna lotion or plain Vaseline Jelly. For hands apply Neutrogena [place] Hand Cream or Excipial Hand Cream.  7. Reapply moisturizer any time you start to itch or feel dry.  8. Sometimes using free and clear laundry detergents can be helpful. Fabric softener sheets should be avoided. Downy Free & Gentle liquid, or any liquid fabric softener that is free of dyes and perfumes, it acceptable to use  9. If your doctor has given you prescription creams you may apply moisturizers over them      Due to recent changes in healthcare laws, you may see results of your pathology and/or laboratory studies on MyChart before the doctors have had a chance to review them. We understand that in some cases there may be results that are confusing or concerning to you. Please understand that not all results are received at the same  time and often the doctors may need to interpret multiple results in order to provide you with the best plan of care or course of treatment. Therefore, we ask that you please give [place] 2 business days to thoroughly review all your results before contacting the office for clarification. Should we see a critical lab result, you will be contacted sooner.   If You Need Anything After Your Visit  If you have any questions or concerns for your doctor, please call our main line at [phone] and press option 4 to reach your doctor's  medical assistant. If no one answers, please leave a voicemail as directed and we will return your call as soon as possible. Messages left after 4 pm will be answered the following business day.   You may also send [place] a message via [place]. We typically respond to MyChart messages within 1-2 business days.  For prescription refills, please ask your pharmacy to contact our office. Our fax number is [phone].  If you have an urgent issue when the clinic is closed that cannot wait until the next business day, you can page your doctor at the number below.    Please note that while we do our best to be available for urgent issues outside of office hours, we are not available 24/7.   If you have an urgent issue and are unable to reach [place], you may choose to seek medical care at your doctor's office, retail clinic, urgent care center, or emergency room.  If you have a medical emergency, please immediately call 911 or go to the emergency department.  Pager Numbers  - Dr. Nehemiah Massed: [phone]  - Dr. Laurence Ferrari: [phone]  - Dr. Nicole Kindred: [phone]  In the event of inclement weather, please call our main line at [phone] for an update on the status of any delays or closures.  Dermatology Medication Tips: Please keep the boxes that topical medications come in in order to help keep track of the instructions about where and how to use these. Pharmacies typically print the medication instructions only on the boxes and not directly on the medication tubes.   If your medication is too expensive, please contact our office at [phone] option 4 or send [place] a message through [place].   We are unable to tell what your co-pay for medications will be in advance as this is different depending on your insurance coverage. However, we may be able to find a substitute medication at lower cost or fill out paperwork to get insurance to cover a needed medication.   If a prior authorization is required to  get your medication covered by your insurance company, please allow [place] 1-2 business days to complete this process.  Drug prices often vary depending on where the prescription is filled and some pharmacies may offer cheaper prices.  The website www.goodrx.com contains coupons for medications through different pharmacies. The prices here do not account for what the cost may be with help from insurance (it may be cheaper with your insurance), but the website can give you the price if you did not use any insurance.  - You can print the associated coupon and take it with your prescription to the pharmacy.  - You may also stop by our office during regular business hours and pick up a GoodRx coupon card.  - If you need your prescription sent electronically to a different pharmacy, notify our office through [facility] [employer] or by phone at [phone] option 4.  Si Usted Necesita Algo Despus de Su Visita  Tambin puede enviarnos un mensaje a travs de MyChart. Por lo general respondemos a los mensajes de MyChart en el transcurso de 1 a 2 das hbiles.  Para renovar recetas, por favor pida a su farmacia que se ponga en contacto con nuestra oficina. Nuestro nmero de fax es el [phone].  Si tiene un asunto urgente cuando la clnica est cerrada y que no puede esperar hasta el siguiente da hbil, puede llamar/localizar a su doctor(a) al nmero que aparece a continuacin.   Por favor, tenga en cuenta que aunque hacemos todo lo posible para estar disponibles para asuntos urgentes fuera del horario de oficina, no estamos disponibles las 24 horas del da, los 7 das de la semana.   Si tiene un problema urgente y no puede comunicarse con nosotros, puede optar por buscar atencin mdica  en el consultorio de su doctor(a), en una clnica privada, en un centro de atencin urgente o en una sala de emergencias.  Si tiene una emergencia mdica, por favor llame inmediatamente al 911 o vaya a la sala de  emergencias.  Nmeros de bper  - Dr. Kowalski: [phone]  - Dra. Moye: [phone]  - Dra. Stewart: [phone]  En caso de inclemencias del tiempo, por favor llame a nuestra lnea principal al [phone] para una actualizacin sobre el estado de cualquier retraso o cierre.  Consejos para la medicacin en dermatologa: Por favor, guarde las cajas en las que vienen los medicamentos de uso tpico para ayudarle a seguir las instrucciones sobre dnde y cmo usarlos. Las farmacias generalmente imprimen las instrucciones del medicamento slo en las cajas y no directamente en los tubos del medicamento.   Si su medicamento es muy caro, por favor, pngase en contacto con nuestra oficina llamando al [phone] y presione la opcin 4 o envenos un mensaje a travs de MyChart.   No podemos decirle cul ser su copago por los medicamentos por adelantado ya que esto es diferente dependiendo de la cobertura de su seguro. Sin embargo, es posible que podamos encontrar un medicamento sustituto a menor costo o llenar un formulario para que el seguro cubra el medicamento que se considera necesario.   Si se requiere una autorizacin previa para que su compaa de seguros cubra su medicamento, por favor permtanos de 1 a 2 das hbiles para completar este proceso.  Los precios de los medicamentos varan con frecuencia dependiendo del lugar de dnde se surte la receta y alguna farmacias pueden ofrecer precios ms baratos.  El sitio web www.goodrx.com tiene cupones para medicamentos de diferentes farmacias. Los precios aqu no tienen en cuenta lo que podra costar con la ayuda del seguro (puede ser ms barato con su seguro), pero el sitio web puede darle el precio si no utiliz ningn seguro.  - Puede imprimir el cupn correspondiente y llevarlo con su receta a la farmacia.  - Tambin puede pasar por nuestra oficina durante el horario de atencin regular y recoger una tarjeta de cupones de GoodRx.  - Si  necesita que su receta se enve electrnicamente a una farmacia diferente, informe a nuestra oficina a travs de MyChart de [place] o por telfono llamando al [phone] y presione la opcin 4.  

## 2022-08-14 ENCOUNTER — Ambulatory Visit: Payer: Medicare Other | Admitting: Internal Medicine

## 2022-08-16 ENCOUNTER — Other Ambulatory Visit: Payer: Self-pay

## 2022-08-23 ENCOUNTER — Other Ambulatory Visit (HOSPITAL_COMMUNITY): Payer: Self-pay

## 2022-08-31 ENCOUNTER — Ambulatory Visit: Payer: Medicare Other | Admitting: Internal Medicine

## 2022-08-31 ENCOUNTER — Encounter: Payer: Self-pay | Admitting: Internal Medicine

## 2022-08-31 ENCOUNTER — Other Ambulatory Visit (HOSPITAL_COMMUNITY): Payer: Self-pay

## 2022-08-31 VITALS — BP 110/70 | HR 80 | Ht 62.25 in | Wt 93.0 lb

## 2022-08-31 DIAGNOSIS — R1013 Epigastric pain: Secondary | ICD-10-CM | POA: Diagnosis not present

## 2022-08-31 DIAGNOSIS — R194 Change in bowel habit: Secondary | ICD-10-CM

## 2022-08-31 DIAGNOSIS — R140 Abdominal distension (gaseous): Secondary | ICD-10-CM

## 2022-08-31 DIAGNOSIS — K911 Postgastric surgery syndromes: Secondary | ICD-10-CM

## 2022-08-31 DIAGNOSIS — T39095S Adverse effect of salicylates, sequela: Secondary | ICD-10-CM

## 2022-08-31 DIAGNOSIS — K649 Unspecified hemorrhoids: Secondary | ICD-10-CM

## 2022-08-31 MED ORDER — PANTOPRAZOLE SODIUM 40 MG PO TBEC
40.0000 mg | DELAYED_RELEASE_TABLET | Freq: Two times a day (BID) | ORAL | 3 refills | Status: DC
  Filled 2022-08-31 – 2022-10-06 (×3): qty 180, 90d supply, fill #0
  Filled 2023-01-01: qty 180, 90d supply, fill #1

## 2022-08-31 NOTE — Progress Notes (Signed)
Alexandra Lewis 66 y.o. [birth year] [id number]  Assessment & Plan:   Encounter Diagnoses  Name Primary?   Abdominal pain, epigastric Yes   Bloating    Altered bowel habits    POSTGASTRIC SURGERY SYNDROMES    Hemorrhoids per history     Evaluate with EGD and colonoscopy.The risks and benefits as well as alternatives of endoscopic procedure(s) have been discussed and reviewed. All questions answered. The patient agrees to proceed. Further plans pending this evaluation.  Meds ordered this encounter  Medications   pantoprazole (PROTONIX) 40 MG tablet    Sig: Take 1 tablet (40 mg total) by mouth 2 (two) times daily.    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  Last seen here in 2014 with EGD finding a pyloric ulcer thought due to Fiorinal use.  She has dealt with chronic recurrent gastrointestinal issues related to the vagotomy with some dumping issues and abdominal pain and bloating but over the past several months things have worsened.  She does still use Fiorinal but only a couple times a week perhaps and generally uses Tylenol with codeine for headaches.  She tries to follow a dumping syndrome diet mostly she eats 1 medium sized meal a day e.g. a cheeseburger and some [place] fries, and several snacks a day.  Also has at least 1 Ensure clear each day.  Her weight remains low but stable overall.  She has been having more bloating and some epigastric pain and lower abdominal discomfort.  She has a protrusion at the anus that she thinks is a hemorrhoid that does not bleed but is a bit sore.  She has had some alteration in bowel habits which is somewhat chronic but may be a little bit worse.  She has been taking PPI twice daily borrowing from  her husband.  She avoids carbonated beverages as part of her diet.  She says she cannot vomit but does get nauseous at times.  Sometimes she has a hot sensation at her abdominal surgery scar.  There are no signs of upper or lower GI bleeding.  She has been diagnosed with rheumatoid arthritis and is followed at the rheumatology clinic through [facility].  Continues to see Dr. Lavon Paganini for primary care. Wt Readings from Last 3 Encounters:  08/31/22 93 lb (42.2 kg)  03/07/22 89 lb 9.6 oz (40.6 kg)  04/29/21 91 lb 12.8 oz (41.6 kg)   Last colonoscopy 2008, negative, Dr. Randa Evens   Lab Results  Component Value Date   IRON 151 (H) 04/25/2022   TIBC 296.8 04/25/2022   FERRITIN 18.1 04/25/2022   Lab Results  Component Value Date   WBC 5.9 04/25/2022   HGB 12.7 04/25/2022   HCT 37.5 04/25/2022   MCV 90.3 04/25/2022   PLT 236.0 04/25/2022   Lab Results  Component Value Date   VITAMINB12 464 04/25/2022   Lab Results  Component Value Date   FOLATE 14.7 04/25/2022    Allergies  Allergen Reactions   Methotrexate Nausea Only    Other reaction(s): Vomiting Fatigue   Promethazine Hcl     Dystonic reaction   Sumatriptan Other (See Comments)   Celecoxib Rash   Rizatriptan Palpitations   Tape Rash   Current Meds  Medication Sig   acetaminophen-codeine (  TYLENOL #3) 300-30 MG tablet TAKE 1 TABLET BY MOUTH EVERY 6 HOURS AS NEEDED FOR MODERATE PAIN   albuterol (VENTOLIN HFA) 108 (90 Base) MCG/ACT inhaler Inhale 2 puffs into the lungs every 6 (six) hours as needed for wheezing or shortness of breath.   butalbital-aspirin-caffeine-codeine (FIORINAL WITH CODEINE) 50-325-40-30 MG capsule TAKE TWO CAPSULES BY MOUTH EVERY 6 HOURS AS NEEDED PAIN. MAX OF 6 CAPSULES DAILY. *DO NOT FILL EARLIER THAN 30 DAYS*   diazepam (VALIUM) 2 MG tablet Take 1 tablet (2 mg total) by mouth every 12 (twelve) hours as needed. for anxiety   divalproex (DEPAKOTE) 125 MG DR tablet TAKE 1 TABLET BY MOUTH DAILY    ergocalciferol (VITAMIN D2) 1.25 MG (50000 UT) capsule Take 2 capsules (100,000 Units total) by mouth once a week (Patient taking differently: Take 50,000 Units by mouth once a week.)   etanercept (ENBREL SURECLICK) 50 MG/ML injection Inject 1 mL (50 mg total) subcutaneously once a week   Famotidine (PEPCID PO) Take 1 tablet by mouth daily with supper.   folic acid (FOLVITE) 1 MG tablet Take 2 tablets by mouth daily.   INS SYRINGE/NEEDLE 1CC/28G (B-D INSULIN SYRINGE 1CC/28G) 28G X 1/2" 1 ML MISC For use with b12 injections   lidocaine-prilocaine (EMLA) cream APPLY 1 APPLICATION TO THE SKIN AS NEEDED   mometasone (ELOCON) 0.1 % cream Apply to itchy spots on body once to twice daily as needed for itch.   ondansetron (ZOFRAN) 4 MG tablet TAKE 1 TABLET BY MOUTH EVERY 8 HOURS AS NEEDED FOR NAUSEA OR VOMITING   pantoprazole (PROTONIX) 40 MG tablet Take 1 tablet (40 mg total) by mouth daily. (Patient taking differently: Take 40 mg by mouth 2 (two) times daily.)   SYRINGE-NEEDLE, DISP, 3 ML 25G X 1" 3 ML MISC Use to inject B12.   TUBERCULIN SYR 1CC/27GX1/2" (B-D TB SYRINGE 1CC/27GX1/2") 27G X 1/2" 1 ML MISC Use to inject methotrexate   ZOLMitriptan (ZOMIG) 2.5 MG tablet Take 1/2 to 1 tablet by mouth daily as needed for migraine headaches   Past Medical History:  Diagnosis Date   Adverse effect of salicylate- ulcer 03/26/2013   Anemia    Chronic back pain    Chronic Migraine Headaches    CVA (cerebrovascular accident) 12/05/2011   Left CVA(corona radiata); Right sided weakness   Depression    Dysplastic nevus 06/2011   Left abdomen s/p excision 07/2011   GERD (gastroesophageal reflux disease)    History of dysplastic nevus 08/10/2011   left abdomen, severe atypia/excision   History of peptic ulcer disease    IBS (irritable bowel syndrome)    Loss of weight    NSAID-induced gastric ulcer- pylorus 01/17/2013   Osteoporosis    T6 compression fracture   Partial gastric outlet obstruction  07/04/2006   Postgastric surgery syndromes    Pyloric stenosis 11/25/2008   S/P Pyloroplasty- 06/29/09 - Dr. Daphine DeutscherMartin   RA (rheumatoid arthritis)    Past Surgical History:  Procedure Laterality Date   BREAST SURGERY Bilateral    ESOPHAGOGASTRODUODENOSCOPY  04/13/2009   S/P EGD with dilitation of pylorus-Dr. Ewing SchleinMagod   ESOPHAGOGASTRODUODENOSCOPY  11/25/2008   Status post EGD with dilatation Dr. Ewing SchleinMagod   ESOPHAGOGASTRODUODENOSCOPY  07/04/2006   S/P EGD and Bx-Dr. Randa EvensEdwards   EXCISION MORTON'S NEUROMA  1983   Right foot   Pyloroplasty with Vagotomy  06/29/2009   S/P pyloroplasty with laparoscopic truncal vagotomy and hiatal hernia repair-07/19/2009 Dr. Daphine DeutscherMartin   SEPTOPLASTY  1982   SKIN BIOPSY  TOTAL ABDOMINAL HYSTERECTOMY  2000   [facility]-Dr. DeFrancesco   Social History   Social History Narrative   She is married to Dr. Kristin Bruins. (Dental medicine)   No children.   No history of smoking, drinking alcohol, or other illicit drug use.   family history includes Breast cancer in her mother; Diabetes in her mother; GER disease in her brother, father, sister, and sister; Heart disease in her paternal grandmother and paternal uncle; Hyperlipidemia in her brother; Hypertension in her brother and mother; Migraines in her sister; Uterine cancer in her mother.   Review of Systems  Joint pain fatigue chronic headaches insomnia dyspnea at times some anxiety.  Otherwise as per HPI. Objective:   Physical Exam @BP  110/70 (BP Location: Left Arm, Patient Position: Sitting, Cuff Size: Normal)   Pulse 80   Ht 5' 2.25" (1.581 m) Comment: height measured without shoes  Wt 93 lb (42.2 kg)   BMI 16.87 kg/m @  General:  Thin, petite ww in no acute distress Eyes:  anicteric. Lungs: Clear to auscultation bilaterally. Heart:  S1S2, no rubs, murmurs, gallops. Abdomen:  soft, non-tender, no hepatosplenomegaly, hernia, or mass and BS+.  Rectal: Deferred to colonoscopy Extremities:   no edema,  cyanosis or clubbing  Neuro:  A&O x 3.  Psych:  appropriate mood and  Affect.   Data Reviewed: See HPI

## 2022-08-31 NOTE — Patient Instructions (Signed)
You have been scheduled for an endoscopy and colonoscopy. Please follow the written instructions given to you at your visit today. Please pick up your prep supplies at the pharmacy within the next 1-3 days. If you use inhalers (even only as needed), please bring them with you on the day of your procedure.  _______________________________________________________  If your blood pressure at your visit was 140/90 or greater, please contact your primary care physician to follow up on this.  _______________________________________________________  If you are age 66 or older, your body mass index should be between 23-30. Your Body mass index is 16.87 kg/m. If this is out of the aforementioned range listed, please consider follow up with your Primary Care Provider.  If you are age 44 or younger, your body mass index should be between 19-25. Your Body mass index is 16.87 kg/m. If this is out of the aformentioned range listed, please consider follow up with your Primary Care Provider.   ________________________________________________________  The [place] providers would like to encourage you to use [facility] to communicate with providers for non-urgent requests or questions.  Due to long hold times on the telephone, sending your provider a message by [facility] may be a faster and more efficient way to get a response.  Please allow 48 business hours for a response.  Please remember that this is for non-urgent requests.  _______________________________________________________  I appreciate the opportunity to care for you. Stan Head, MD, [facility]

## 2022-09-01 ENCOUNTER — Other Ambulatory Visit (HOSPITAL_COMMUNITY): Payer: Self-pay

## 2022-09-06 ENCOUNTER — Other Ambulatory Visit: Payer: Self-pay

## 2022-09-06 ENCOUNTER — Other Ambulatory Visit (HOSPITAL_COMMUNITY): Payer: Self-pay

## 2022-09-06 MED ORDER — ENBREL SURECLICK 50 MG/ML ~~LOC~~ SOAJ
SUBCUTANEOUS | 1 refills | Status: DC
  Filled 2022-10-03: qty 4, 28d supply, fill #0
  Filled 2022-11-27: qty 4, 28d supply, fill #1
  Filled 2023-01-18: qty 4, 28d supply, fill #2
  Filled 2023-02-15: qty 4, 28d supply, fill #3

## 2022-09-07 ENCOUNTER — Other Ambulatory Visit (HOSPITAL_COMMUNITY): Payer: Self-pay

## 2022-09-07 ENCOUNTER — Other Ambulatory Visit: Payer: Self-pay

## 2022-09-07 ENCOUNTER — Other Ambulatory Visit: Payer: Self-pay | Admitting: Internal Medicine

## 2022-09-07 MED FILL — Diazepam Tab 2 MG: ORAL | 30 days supply | Qty: 60 | Fill #1 | Status: AC

## 2022-09-08 ENCOUNTER — Other Ambulatory Visit (HOSPITAL_COMMUNITY): Payer: Self-pay

## 2022-09-08 ENCOUNTER — Other Ambulatory Visit: Payer: Self-pay

## 2022-09-08 MED ORDER — ZOLMITRIPTAN 2.5 MG PO TABS
1.2500 mg | ORAL_TABLET | Freq: Every day | ORAL | 5 refills | Status: DC | PRN
  Filled 2022-09-08: qty 8, 28d supply, fill #0
  Filled 2022-10-06: qty 8, 28d supply, fill #1
  Filled 2022-11-11: qty 8, 28d supply, fill #2
  Filled 2022-11-14: qty 8, 28d supply, fill #3
  Filled 2022-12-28: qty 8, 28d supply, fill #4
  Filled 2023-01-26: qty 8, 28d supply, fill #5

## 2022-09-08 NOTE — Telephone Encounter (Signed)
Patient last seen 02/2022; appts for Jan/Feb/Mar all canceled by patient, she has not rescheduled.   Ok to refill?

## 2022-09-09 ENCOUNTER — Other Ambulatory Visit (HOSPITAL_COMMUNITY): Payer: Self-pay

## 2022-09-19 ENCOUNTER — Ambulatory Visit: Payer: Medicare Other | Admitting: Cardiovascular Disease

## 2022-09-20 ENCOUNTER — Encounter: Payer: Medicare Other | Admitting: Internal Medicine

## 2022-09-27 ENCOUNTER — Encounter: Payer: Medicare Other | Admitting: Internal Medicine

## 2022-09-28 ENCOUNTER — Other Ambulatory Visit: Payer: Self-pay

## 2022-09-28 ENCOUNTER — Other Ambulatory Visit (HOSPITAL_COMMUNITY): Payer: Self-pay

## 2022-10-03 ENCOUNTER — Other Ambulatory Visit (HOSPITAL_COMMUNITY): Payer: Self-pay

## 2022-10-04 ENCOUNTER — Other Ambulatory Visit: Payer: Self-pay | Admitting: Internal Medicine

## 2022-10-05 ENCOUNTER — Other Ambulatory Visit (HOSPITAL_COMMUNITY): Payer: Self-pay

## 2022-10-05 ENCOUNTER — Encounter: Payer: Self-pay | Admitting: Internal Medicine

## 2022-10-05 ENCOUNTER — Encounter: Payer: Medicare Other | Admitting: Internal Medicine

## 2022-10-06 ENCOUNTER — Other Ambulatory Visit: Payer: Self-pay

## 2022-10-06 ENCOUNTER — Other Ambulatory Visit (HOSPITAL_COMMUNITY): Payer: Self-pay

## 2022-10-15 ENCOUNTER — Telehealth: Payer: Medicare Other | Admitting: Physician Assistant

## 2022-10-15 DIAGNOSIS — R3989 Other symptoms and signs involving the genitourinary system: Secondary | ICD-10-CM | POA: Diagnosis not present

## 2022-10-15 MED ORDER — CEPHALEXIN 500 MG PO CAPS: 500.0000 mg | ORAL_CAPSULE | Freq: Two times a day (BID) | ORAL | 0 refills | Status: AC

## 2022-10-15 NOTE — Progress Notes (Signed)
E-Visit for Urinary Problems  We are sorry that you are not feeling well.  Here is how we plan to help!  Based on what you shared with me it looks like you most likely have a simple urinary tract infection.  A UTI (Urinary Tract Infection) is a bacterial infection of the bladder.  Most cases of urinary tract infections are simple to treat but a key part of your care is to encourage you to drink plenty of fluids and watch your symptoms carefully.  I have prescribed Keflex 500 mg twice a day for 7 days.  Your symptoms should gradually improve. Call us if the burning in your urine worsens, you develop worsening fever, back pain or pelvic pain or if your symptoms do not resolve after completing the antibiotic.  Urinary tract infections can be prevented by drinking plenty of water to keep your body hydrated.  Also be sure when you wipe, wipe from front to back and don't hold it in!  If possible, empty your bladder every 4 hours.  HOME CARE Drink plenty of fluids Compete the full course of the antibiotics even if the symptoms resolve Remember, when you need to go.go. Holding in your urine can increase the likelihood of getting a UTI! GET HELP RIGHT AWAY IF: You cannot urinate You get a high fever Worsening back pain occurs You see blood in your urine You feel sick to your stomach or throw up You feel like you are going to pass out  MAKE SURE YOU  Understand these instructions. Will watch your condition. Will get help right away if you are not doing well or get worse.   Thank you for choosing an e-visit.  Your e-visit answers were reviewed by a board certified advanced clinical practitioner to complete your personal care plan. Depending upon the condition, your plan could have included both over the counter or prescription medications.  Please review your pharmacy choice. Make sure the pharmacy is open so you can pick up prescription now. If there is a problem, you may contact your  provider through [employer] and have the prescription routed to another pharmacy.  Your safety is important to [place]. If you have drug allergies check your prescription carefully.   For the next 24 hours you can use MyChart to ask questions about today's visit, request a non-urgent call back, or ask for a work or school excuse. You will get an email in the next two days asking about your experience. I hope that your e-visit has been valuable and will speed your recovery.   I have spent 5 minutes in review of e-visit questionnaire, review and updating patient chart, medical decision making and response to patient.   Tylene Fantasia Ward, PA-C

## 2022-10-18 ENCOUNTER — Ambulatory Visit: Payer: Medicare Other | Admitting: Dermatology

## 2022-10-31 ENCOUNTER — Other Ambulatory Visit: Payer: Self-pay

## 2022-11-03 ENCOUNTER — Other Ambulatory Visit (HOSPITAL_COMMUNITY): Payer: Self-pay

## 2022-11-03 ENCOUNTER — Encounter: Payer: Self-pay | Admitting: Internal Medicine

## 2022-11-08 ENCOUNTER — Encounter: Payer: Self-pay | Admitting: Internal Medicine

## 2022-11-08 MED ORDER — BUTALBITAL-ASA-CAFF-CODEINE 50-325-40-30 MG PO CAPS: ORAL_CAPSULE | ORAL | 0 refills | Status: DC

## 2022-11-08 MED ORDER — ACETAMINOPHEN-CODEINE 300-30 MG PO TABS: 1.0000 | ORAL_TABLET | Freq: Four times a day (QID) | ORAL | 0 refills | Status: DC | PRN

## 2022-11-08 NOTE — Telephone Encounter (Signed)
Fiorinal with Codeine refilled: 10/05/2022 Tylenol #3 refilled: 10/05/2022  Last OV: 03/07/2022 Next OV: 11/28/2022

## 2022-11-11 ENCOUNTER — Other Ambulatory Visit (HOSPITAL_COMMUNITY): Payer: Self-pay

## 2022-11-14 ENCOUNTER — Other Ambulatory Visit (HOSPITAL_COMMUNITY): Payer: Self-pay

## 2022-11-15 ENCOUNTER — Encounter: Payer: Medicare Other | Admitting: Internal Medicine

## 2022-11-17 ENCOUNTER — Telehealth: Payer: Self-pay | Admitting: Pharmacy Technician

## 2022-11-17 ENCOUNTER — Telehealth: Payer: Self-pay | Admitting: Internal Medicine

## 2022-11-17 NOTE — Telephone Encounter (Signed)
Alexandra Lewis from [employer] called statng the ZOLMitriptan has been approved starting today and is good for a year

## 2022-11-17 NOTE — Telephone Encounter (Signed)
Pt's husband is aware and gave a verbal understanding.  

## 2022-11-17 NOTE — Telephone Encounter (Signed)
Pharmacy Patient Advocate Encounter   Received notification from CoverMyMeds that prior authorization for ZOLMitriptan 2.5MG  tablets is required/requested.    [place] [employer] via [employer]eds Key/confirmation #/EOC [id number] Status is pending

## 2022-11-20 ENCOUNTER — Other Ambulatory Visit (HOSPITAL_COMMUNITY): Payer: Self-pay

## 2022-11-20 NOTE — Telephone Encounter (Signed)
Pharmacy Patient Advocate Encounter  Prior Authorization for ZOLMitriptan 2.5MG  tablets has been APPROVED by [facility] from 11/17/2022 to 11/17/2023.   PA Case ID #: [id number]  Spoke with Pharmacy to process. Copay is $0.00

## 2022-11-25 ENCOUNTER — Other Ambulatory Visit (HOSPITAL_COMMUNITY): Payer: Self-pay

## 2022-11-27 ENCOUNTER — Other Ambulatory Visit (HOSPITAL_COMMUNITY): Payer: Self-pay

## 2022-11-28 ENCOUNTER — Other Ambulatory Visit (HOSPITAL_COMMUNITY): Payer: Self-pay

## 2022-11-28 ENCOUNTER — Ambulatory Visit (INDEPENDENT_AMBULATORY_CARE_PROVIDER_SITE_OTHER): Payer: Medicare Other | Admitting: Internal Medicine

## 2022-11-28 ENCOUNTER — Other Ambulatory Visit: Payer: Self-pay

## 2022-11-28 ENCOUNTER — Encounter: Payer: Self-pay | Admitting: Internal Medicine

## 2022-11-28 VITALS — BP 80/56 | HR 86 | Temp 98.1°F | Ht 62.25 in | Wt 95.6 lb

## 2022-11-28 DIAGNOSIS — G894 Chronic pain syndrome: Secondary | ICD-10-CM

## 2022-11-28 DIAGNOSIS — Z79899 Other long term (current) drug therapy: Secondary | ICD-10-CM

## 2022-11-28 DIAGNOSIS — R300 Dysuria: Secondary | ICD-10-CM | POA: Diagnosis not present

## 2022-11-28 DIAGNOSIS — N390 Urinary tract infection, site not specified: Secondary | ICD-10-CM

## 2022-11-28 DIAGNOSIS — F39 Unspecified mood [affective] disorder: Secondary | ICD-10-CM

## 2022-11-28 DIAGNOSIS — F1120 Opioid dependence, uncomplicated: Secondary | ICD-10-CM

## 2022-11-28 DIAGNOSIS — M0579 Rheumatoid arthritis with rheumatoid factor of multiple sites without organ or systems involvement: Secondary | ICD-10-CM

## 2022-11-28 DIAGNOSIS — G43109 Migraine with aura, not intractable, without status migrainosus: Secondary | ICD-10-CM

## 2022-11-28 MED ORDER — TRIAMCINOLONE ACETONIDE 0.1 % EX CREA
1.0000 | TOPICAL_CREAM | Freq: Two times a day (BID) | CUTANEOUS | 0 refills | Status: AC
  Filled 2022-11-28: qty 30, 15d supply, fill #0

## 2022-11-28 NOTE — Patient Instructions (Signed)
Referral to Dr McDiarmied in progress  Collect a urine sample next occurrence BEFORE starting an tnibitoic  Ok to use  cetirizine evey 6 hours

## 2022-11-28 NOTE — Progress Notes (Unsigned)
Subjective:  Patient ID: Alexandra Lewis, female    DOB: June 13, 1955  Age: 67 y.o. MRN: [id number]  CC: The primary encounter diagnosis was Long-term use of high-risk medication. Diagnoses of Dysuria, Frequent UTI, Chronic pain disorder, Episodic mood disorder (HCC), Migraine with aura and without status migrainosus, not intractable, Narcotic dependency, continuous (HCC), and Rheumatoid arthritis involving multiple sites with positive rheumatoid factor (HCC) were also pertinent to this visit.   HPI Alexandra Lewis presents for  Chief Complaint  Patient presents with   Medical Management of Chronic Issues   Last seen October .   1) Chronic pain (migraine headaches)managed with tylenol with codeine  (4 daily)   and fioricet  ( 6 daily) .  Refill history reviewed via PDMP  June 30  1) underweight; wieght stable   3)  urinary problems. Has been having symptoms of bladder infections , occrring 2-3 times per year .  Now taking DEFEND , a D-Mannose supplement , which has helped  . Feels that synptos has worsened since treatment for RA has comenced.  Wants to see Urology [facility]   4) RA; taking Enbrel  prescribed by Allena Katz. Feels hse is getting diminishing returns:  Joints hurting again  5)  Has been having new issues skin rash, itching out of control , bothered by development of SK's,  other flat growths  that are itching ,  saw Edgardo Roys,  given Elocon to use b.  6)  joints hurting, . Moving bowels daily   colonoscopy planned, using a stool softener   7) chronic abdominal pain: has had  multiple abdominal surgeries,  still has one ovary    Outpatient Medications Prior to Visit  Medication Sig Dispense Refill   acetaminophen-codeine (TYLENOL #3) 300-30 MG tablet Take 1 tablet by mouth every 6 (six) hours as needed for moderate pain. 120 tablet 0   albuterol (VENTOLIN HFA) 108 (90 Base) MCG/ACT inhaler Inhale 2 puffs into the lungs every 6 (six) hours as needed for wheezing or  shortness of breath. 8 g 2   butalbital-aspirin-caffeine-codeine (FIORINAL WITH CODEINE) 50-325-40-30 MG capsule TAKE 2 CAPSULES BY MOUTH EVERY 6 HOURS AS NEEDED FOR PAIN (MAX 6 CAPSULES DAILY) 180 capsule 0   diazepam (VALIUM) 2 MG tablet Take 1 tablet (2 mg total) by mouth every 12 (twelve) hours as needed. for anxiety 60 tablet 5   divalproex (DEPAKOTE) 125 MG DR tablet TAKE 1 TABLET BY MOUTH DAILY 90 tablet 2   ergocalciferol (VITAMIN D2) 1.25 MG (50000 UT) capsule Take 2 capsules (100,000 Units total) by mouth once a week (Patient taking differently: Take 50,000 Units by mouth once a week.) 8 capsule 5   etanercept (ENBREL SURECLICK) 50 MG/ML injection Inject 1 mL (50 mg total) subcutaneously once a week 12 mL 1   etanercept (ENBREL SURECLICK) 50 MG/ML injection Inject 1 mL (50 mg total) subcutaneously once a week 12 mL 1   Famotidine (PEPCID PO) Take 1 tablet by mouth daily with supper.     INS SYRINGE/NEEDLE 1CC/28G (B-D INSULIN SYRINGE 1CC/28G) 28G X 1/2" 1 ML MISC For use with b12 injections 10 each 3   lidocaine-prilocaine (EMLA) cream APPLY 1 APPLICATION TO THE SKIN AS NEEDED 30 g 0   ondansetron (ZOFRAN) 4 MG tablet TAKE 1 TABLET BY MOUTH EVERY 8 HOURS AS NEEDED FOR NAUSEA OR VOMITING 30 tablet 4   pantoprazole (PROTONIX) 40 MG tablet Take 1 tablet (40 mg total) by mouth 2 (two) times daily. 180 tablet  3   SYRINGE-NEEDLE, DISP, 3 ML 25G X 1" 3 ML MISC Use to inject B12. 6 each 0   TUBERCULIN SYR 1CC/27GX1/2" (B-D TB SYRINGE 1CC/27GX1/2") 27G X 1/2" 1 ML MISC Use to inject methotrexate 10 each 1   ZOLMitriptan (ZOMIG) 2.5 MG tablet Take 0.5-1 tablets (1.25-2.5 mg total) by mouth daily as needed for migraines. 8 tablet 5   mometasone (ELOCON) 0.1 % cream Apply to itchy spots on body once to twice daily as needed for itch. 45 g 2   folic acid (FOLVITE) 1 MG tablet Take 2 tablets by mouth daily. 90 tablet 3   No facility-administered medications prior to visit.    Review of  Systems;  Patient denies fevers, malaise, unintentional weight loss, , eye pain, sinus congestion and sinus pain, sore throat, dysphagia,  hemoptysis , cough, dyspnea, wheezing, chest pain, palpitations, orthopnea, edema,, nausea, melena, diarrhea, constipation, flank pain, dysuria, hematuria, urinary  Frequency, nocturia, numbness, tingling, seizures,  Focal weakness, Loss of consciousness,  Tremor, insomnia, depression, anxiety, and suicidal ideation.      Objective:  BP (!) 80/56   Pulse 86   Temp 98.1 F (36.7 C) (Oral)   Ht 5' 2.25" (1.581 m)   Wt 95 lb 9.6 oz (43.4 kg)   SpO2 94%   BMI 17.35 kg/m   BP Readings from Last 3 Encounters:  11/28/22 (!) 80/56  08/31/22 110/70  08/09/22 138/75    Wt Readings from Last 3 Encounters:  11/28/22 95 lb 9.6 oz (43.4 kg)  08/31/22 93 lb (42.2 kg)  03/07/22 89 lb 9.6 oz (40.6 kg)    Physical Exam Vitals reviewed.  Constitutional:      General: She is not in acute distress.    Appearance: Normal appearance. She is normal weight. She is not ill-appearing, toxic-appearing or diaphoretic.  HENT:     Head: Normocephalic.  Eyes:     General: No scleral icterus.       Right eye: No discharge.        Left eye: No discharge.     Conjunctiva/sclera: Conjunctivae normal.  Cardiovascular:     Rate and Rhythm: Normal rate and regular rhythm.     Heart sounds: Normal heart sounds.  Pulmonary:     Effort: Pulmonary effort is normal. No respiratory distress.     Breath sounds: Normal breath sounds.  Musculoskeletal:        General: Normal range of motion.  Skin:    General: Skin is warm and dry.  Neurological:     General: No focal deficit present.     Mental Status: She is alert and oriented to person, place, and time. Mental status is at baseline.  Psychiatric:        Mood and Affect: Mood normal.        Behavior: Behavior normal.        Thought Content: Thought content normal.        Judgment: Judgment normal.   Lab Results   Component Value Date   HGBA1C 5.9 04/25/2022    Lab Results  Component Value Date   CREATININE 1.26 (H) 11/28/2022   CREATININE 1.22 (H) 04/25/2022   CREATININE 0.97 04/29/2021    Lab Results  Component Value Date   WBC 5.6 11/28/2022   HGB 12.4 11/28/2022   HCT 37.3 11/28/2022   PLT 247.0 11/28/2022   GLUCOSE 74 11/28/2022   CHOL 248 (H) 04/25/2022   TRIG 96.0 04/25/2022   HDL 106.50 04/25/2022  LDLDIRECT 115.0 04/25/2022   LDLCALC 123 (H) 04/25/2022   ALT 14 11/28/2022   AST 15 11/28/2022   NA 136 11/28/2022   K 4.7 11/28/2022   CL 99 11/28/2022   CREATININE 1.26 (H) 11/28/2022   BUN 22 11/28/2022   CO2 29 11/28/2022   TSH 2.73 04/25/2022   HGBA1C 5.9 04/25/2022    MR Brain W Wo Contrast  Result Date: 05/18/2015 CLINICAL DATA:  67 year old female with posterior headaches with visual aura. Symptoms for 6 months. Initial encounter. 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 EXAM: MRI HEAD WITHOUT AND WITH CONTRAST TECHNIQUE: Multiplanar, multiecho pulse sequences of the brain and surrounding structures were obtained without and with intravenous contrast. CONTRAST:  8mL MULTIHANCE GADOBENATE DIMEGLUMINE 529 MG/ML IV SOLN COMPARISON:  Brain MRI 12/08/2011. FINDINGS: No restricted diffusion to suggest acute infarction. No midline shift, mass effect, evidence of mass lesion, ventriculomegaly, extra-axial collection or acute intracranial hemorrhage. Cervicomedullary junction and pituitary are within normal limits. Negative visualized cervical spine. Major intracranial vascular flow voids are stable. Cerebral volume is stable since 2013. Expected evolution of the small posterior left corona radiata lacunar infarct. No associated hemosiderin. No cortical encephalomalacia, and elsewhere Stable and normal for age gray and white matter signal. No abnormal enhancement identified. No dural thickening identified. Visible internal auditory structures appear normal. Mastoids are  clear. Mild paranasal sinus mucosal thickening is stable. Orbit and scalp soft tissues are within normal limits. IMPRESSION: 1.  No acute intracranial abnormality. 2. Negative for age MRI appearance of the brain aside from expected chronic appearance of the 2013 posterior left corona radiata lacunar infarct. Electronically Signed   By: Odessa Fleming M.D.   On: 05/18/2015 16:19    Assessment & Plan:  .Long-term use of high-risk medication -     Comprehensive metabolic panel -     VITAMIN D 25 Hydroxy (Vit-D Deficiency, Fractures) -     CBC with Differential/Platelet  Dysuria -     Ambulatory referral to Urology -     Urine Culture; Future -     Urinalysis, Routine w reflex microscopic; Future  Frequent UTI -     Ambulatory referral to Urology -     Urine Culture; Future -     Urinalysis, Routine w reflex microscopic; Future  Chronic pain disorder Assessment & Plan: Secondary to arthritis pain and frequent headaches.. managed with tylenol #3 and Fioricet in stable doses. Refill history confirmed via [place] Controlled Substance databas, accessed by me today.Marland Kitchen    Episodic mood disorder [facility]) Assessment & Plan: Diagnosed with bipolar disorder in her late 20's  Several family members also with bipolar disorder.   remeron caused an increase in manic symptoms.  mood is currently stable. Continue Depakoate.   Migraine with aura and without status migrainosus, not intractable Assessment & Plan: Occurring 2-3 times per week, often  weather related/triggered. sometimes waking her up at night  USING  Fiorinal with codeine , Tyelnol #3  and zomig . Refills given    Narcotic dependency, continuous (HCC) Assessment & Plan: She has arthritic pain and frequent headaches.  Managed with Tylenol #3 and Fioroinal with codeine  due to history of gastric ulcerwhich was NSAID induced.  Refill history confirmed via [place] Controlled Substance databas, accessed by me today..   Rheumatoid arthritis involving multiple  sites with positive rheumatoid factor (HCC) Assessment & Plan: She has had multiple treatment failures due to drug intolerances.  Her RA is currently managed with weekly Enbrel, prescribed  by [facility]   Other orders -     Triamcinolone Acetonide; Apply 1 Application topically 2 (two) times daily.  Dispense: 30 g; Refill: 0     I provided 37 minutes of face-to-face time during this encounter reviewing patient's last visit with me, patient's  most recent visit with rheumatology gastroenterology,,  recent surgical and non surgical procedures, previous  labs and imaging studies, counseling on currently addressed issues,  and post visit ordering to diagnostics and therapeutics .   Follow-up: Return in about 5 months (around 04/30/2023).   Sherlene Shams, MD

## 2022-11-29 LAB — CBC WITH DIFFERENTIAL/PLATELET
Basophils Absolute: 0.1 10*3/uL (ref 0.0–0.1)
Basophils Relative: 1 % (ref 0.0–3.0)
Eosinophils Absolute: 0.1 10*3/uL (ref 0.0–0.7)
Eosinophils Relative: 2.5 % (ref 0.0–5.0)
HCT: 37.3 % (ref 36.0–46.0)
Hemoglobin: 12.4 g/dL (ref 12.0–15.0)
Lymphocytes Relative: 28.9 % (ref 12.0–46.0)
Lymphs Abs: 1.6 10*3/uL (ref 0.7–4.0)
MCHC: 33.3 g/dL (ref 30.0–36.0)
MCV: 87.8 fl (ref 78.0–100.0)
Monocytes Absolute: 0.7 10*3/uL (ref 0.1–1.0)
Monocytes Relative: 12.9 % — ABNORMAL HIGH (ref 3.0–12.0)
Neutro Abs: 3.1 10*3/uL (ref 1.4–7.7)
Neutrophils Relative %: 54.7 % (ref 43.0–77.0)
Platelets: 247 10*3/uL (ref 150.0–400.0)
RBC: 4.25 Mil/uL (ref 3.87–5.11)
RDW: 13.8 % (ref 11.5–15.5)
WBC: 5.6 10*3/uL (ref 4.0–10.5)

## 2022-11-29 LAB — COMPREHENSIVE METABOLIC PANEL
ALT: 14 U/L (ref 0–35)
AST: 15 U/L (ref 0–37)
Albumin: 4.1 g/dL (ref 3.5–5.2)
Alkaline Phosphatase: 96 U/L (ref 39–117)
BUN: 22 mg/dL (ref 6–23)
CO2: 29 mEq/L (ref 19–32)
Calcium: 8.9 mg/dL (ref 8.4–10.5)
Chloride: 99 mEq/L (ref 96–112)
Creatinine, Ser: 1.26 mg/dL — ABNORMAL HIGH (ref 0.40–1.20)
GFR: 44.59 mL/min — ABNORMAL LOW (ref >60.00)
Glucose, Bld: 74 mg/dL (ref 70–99)
Potassium: 4.7 mEq/L (ref 3.5–5.1)
Sodium: 136 mEq/L (ref 135–145)
Total Bilirubin: 0.2 mg/dL (ref 0.2–1.2)
Total Protein: 6 g/dL (ref 6.0–8.3)

## 2022-11-29 LAB — VITAMIN D 25 HYDROXY (VIT D DEFICIENCY, FRACTURES): VITD: 57.14 ng/mL (ref 30.00–100.00)

## 2022-11-29 NOTE — Assessment & Plan Note (Signed)
Diagnosed with bipolar disorder in her late 20's  Several family members also with bipolar disorder.   remeron caused an increase in manic symptoms.  mood is currently stable. Continue Depakoate.

## 2022-11-29 NOTE — Assessment & Plan Note (Signed)
She has had multiple treatment failures due to drug intolerances.  Her RA is currently managed with weekly Enbrel, prescribed by [facility] 

## 2022-11-29 NOTE — Assessment & Plan Note (Signed)
Secondary to arthritis pain and frequent headaches.. managed with tylenol #3 and Fioricet in stable doses. Refill history confirmed via  Controlled Substance databas, accessed by me today.Marland Kitchen

## 2022-11-29 NOTE — Assessment & Plan Note (Signed)
Occurring 2-3 times per week, often  weather related/triggered. sometimes waking her up at night  USING  Fiorinal with codeine , Tyelnol #3  and zomig . Refills given

## 2022-11-29 NOTE — Assessment & Plan Note (Signed)
She has arthritic pain and frequent headaches.  Managed with Tylenol #3 and Fioroinal with codeine  due to history of gastric ulcerwhich was NSAID induced.  Refill history confirmed via [place] Controlled Substance databas, accessed by me today.Marland Kitchen

## 2022-11-30 ENCOUNTER — Telehealth: Payer: Self-pay | Admitting: Internal Medicine

## 2022-11-30 ENCOUNTER — Other Ambulatory Visit: Payer: Self-pay

## 2022-11-30 ENCOUNTER — Other Ambulatory Visit (HOSPITAL_COMMUNITY): Payer: Self-pay

## 2022-11-30 NOTE — Telephone Encounter (Signed)
They also would like to confirm if Dr. Darrick Huntsman was the one prescribing it. They stated they will call back.

## 2022-11-30 NOTE — Telephone Encounter (Signed)
[facility] called in stating that they need clarification for med acetaminophen-codeine (TYLENOL #3) 300-30 MG tablet

## 2022-12-01 ENCOUNTER — Encounter: Payer: Self-pay | Admitting: Internal Medicine

## 2022-12-01 ENCOUNTER — Other Ambulatory Visit: Payer: Self-pay | Admitting: Internal Medicine

## 2022-12-01 ENCOUNTER — Other Ambulatory Visit (HOSPITAL_COMMUNITY): Payer: Self-pay

## 2022-12-01 DIAGNOSIS — E538 Deficiency of other specified B group vitamins: Secondary | ICD-10-CM

## 2022-12-01 DIAGNOSIS — N289 Disorder of kidney and ureter, unspecified: Secondary | ICD-10-CM

## 2022-12-01 NOTE — Telephone Encounter (Signed)
Pt husband called about mychart message referral

## 2022-12-04 ENCOUNTER — Other Ambulatory Visit: Payer: Self-pay | Admitting: Internal Medicine

## 2022-12-04 ENCOUNTER — Encounter (HOSPITAL_COMMUNITY): Payer: Self-pay

## 2022-12-04 ENCOUNTER — Encounter: Payer: Self-pay | Admitting: Internal Medicine

## 2022-12-04 ENCOUNTER — Other Ambulatory Visit: Payer: Self-pay

## 2022-12-04 ENCOUNTER — Telehealth: Payer: Self-pay

## 2022-12-04 ENCOUNTER — Other Ambulatory Visit (HOSPITAL_COMMUNITY): Payer: Self-pay

## 2022-12-04 MED ORDER — ACETAMINOPHEN-CODEINE 300-30 MG PO TABS: 1.0000 | ORAL_TABLET | ORAL | 0 refills | Status: DC | PRN

## 2022-12-04 MED ORDER — DIAZEPAM 2 MG PO TABS
2.0000 mg | ORAL_TABLET | Freq: Two times a day (BID) | ORAL | 0 refills | Status: DC | PRN
  Filled 2022-12-04: qty 60, 30d supply, fill #0

## 2022-12-04 MED ORDER — BUTALBITAL-APAP-CAFFEINE 50-325-40 MG PO TABS: 1.0000 | ORAL_TABLET | ORAL | 0 refills | Status: DC | PRN

## 2022-12-04 MED ORDER — BUTALBITAL-APAP-CAFF-COD 50-325-40-30 MG PO CAPS: 1.0000 | ORAL_CAPSULE | ORAL | 0 refills | Status: DC | PRN

## 2022-12-04 MED ORDER — CYANOCOBALAMIN 1000 MCG/ML IJ SOLN
INTRAMUSCULAR | 5 refills | Status: AC
  Filled 2022-12-04 (×2): qty 6, 84d supply, fill #0
  Filled 2023-07-19: qty 6, 84d supply, fill #1
  Filled 2023-10-09: qty 6, 84d supply, fill #2

## 2022-12-04 NOTE — Telephone Encounter (Signed)
Loraine Leriche is calling from [facility] to validate prescription they received for patient on 11/08/2022 for acetaminophen-codeine (TYLENOL #3) 300-30 MG tablet.

## 2022-12-04 NOTE — Telephone Encounter (Signed)
Tylenol #3 has been canceled at [facility].

## 2022-12-04 NOTE — Telephone Encounter (Signed)
Refilled: 03/13/2022 Last OV: 11/28/2022 Next OV: 05/01/2023

## 2022-12-17 MED ORDER — ACETAMINOPHEN-CODEINE 300-30 MG PO TABS: 1.0000 | ORAL_TABLET | ORAL | 0 refills | Status: DC | PRN

## 2022-12-17 NOTE — Addendum Note (Signed)
Addended by: Sherlene Shams on: 12/17/2022 06:46 PM   Modules accepted: Orders

## 2022-12-19 ENCOUNTER — Ambulatory Visit: Payer: Medicare Other | Admitting: Cardiovascular Disease

## 2022-12-21 ENCOUNTER — Other Ambulatory Visit: Payer: Self-pay

## 2022-12-25 ENCOUNTER — Other Ambulatory Visit (HOSPITAL_COMMUNITY): Payer: Self-pay

## 2022-12-28 ENCOUNTER — Other Ambulatory Visit (HOSPITAL_COMMUNITY): Payer: Self-pay

## 2023-01-01 ENCOUNTER — Other Ambulatory Visit: Payer: Self-pay

## 2023-01-15 ENCOUNTER — Telehealth: Payer: Self-pay | Admitting: Family

## 2023-01-15 ENCOUNTER — Other Ambulatory Visit: Payer: Self-pay | Admitting: Internal Medicine

## 2023-01-15 NOTE — Telephone Encounter (Signed)
Spoke with pt's husband and he stated that pt is out of medication. Dr. Darrick Huntsman is out of the office until Wednesday.   Last OV: 11/28/2022 Next OV: 05/01/2023

## 2023-01-15 NOTE — Telephone Encounter (Signed)
Call patient Dr Darrick Huntsman is the office.  I received a refill request  Patient last filled tylenol with codeine December 19, 2022 for 180 tablets.   She is requesting a refill 4 days early  Has she run out of medication? How often is she taking medication?

## 2023-01-16 ENCOUNTER — Ambulatory Visit: Payer: Medicare Other | Admitting: Dermatology

## 2023-01-16 ENCOUNTER — Other Ambulatory Visit (HOSPITAL_COMMUNITY): Payer: Self-pay

## 2023-01-17 NOTE — Telephone Encounter (Signed)
See refill request.

## 2023-01-18 ENCOUNTER — Other Ambulatory Visit: Payer: Self-pay

## 2023-01-19 ENCOUNTER — Other Ambulatory Visit (HOSPITAL_COMMUNITY): Payer: Self-pay

## 2023-01-23 ENCOUNTER — Other Ambulatory Visit (HOSPITAL_COMMUNITY): Payer: Self-pay

## 2023-01-23 ENCOUNTER — Other Ambulatory Visit: Payer: Self-pay

## 2023-01-23 ENCOUNTER — Ambulatory Visit (AMBULATORY_SURGERY_CENTER): Payer: Medicare Other

## 2023-01-23 VITALS — Ht 63.5 in | Wt 93.0 lb

## 2023-01-23 DIAGNOSIS — R1013 Epigastric pain: Secondary | ICD-10-CM

## 2023-01-23 DIAGNOSIS — R194 Change in bowel habit: Secondary | ICD-10-CM

## 2023-01-23 DIAGNOSIS — K911 Postgastric surgery syndromes: Secondary | ICD-10-CM

## 2023-01-23 DIAGNOSIS — R140 Abdominal distension (gaseous): Secondary | ICD-10-CM

## 2023-01-23 MED ORDER — NA SULFATE-K SULFATE-MG SULF 17.5-3.13-1.6 GM/177ML PO SOLN
1.0000 | Freq: Once | ORAL | 0 refills | Status: AC
Start: 2023-01-23 — End: 2023-01-25
  Filled 2023-01-23: qty 354, 1d supply, fill #0

## 2023-01-23 NOTE — Progress Notes (Signed)

## 2023-01-24 ENCOUNTER — Other Ambulatory Visit: Payer: Self-pay

## 2023-01-25 ENCOUNTER — Encounter: Payer: Self-pay | Admitting: Internal Medicine

## 2023-01-27 ENCOUNTER — Other Ambulatory Visit (HOSPITAL_COMMUNITY): Payer: Self-pay

## 2023-01-30 ENCOUNTER — Other Ambulatory Visit: Payer: Self-pay

## 2023-02-07 ENCOUNTER — Other Ambulatory Visit (HOSPITAL_COMMUNITY): Payer: Self-pay

## 2023-02-08 ENCOUNTER — Encounter: Payer: Self-pay | Admitting: Internal Medicine

## 2023-02-08 ENCOUNTER — Ambulatory Visit (AMBULATORY_SURGERY_CENTER): Payer: Medicare Other | Admitting: Internal Medicine

## 2023-02-08 VITALS — BP 134/79 | HR 76 | Temp 97.9°F | Resp 15 | Ht 62.0 in | Wt 93.0 lb

## 2023-02-08 DIAGNOSIS — R194 Change in bowel habit: Secondary | ICD-10-CM | POA: Diagnosis not present

## 2023-02-08 DIAGNOSIS — K2950 Unspecified chronic gastritis without bleeding: Secondary | ICD-10-CM | POA: Diagnosis not present

## 2023-02-08 DIAGNOSIS — R1013 Epigastric pain: Secondary | ICD-10-CM

## 2023-02-08 DIAGNOSIS — Z860101 Personal history of adenomatous and serrated colon polyps: Secondary | ICD-10-CM

## 2023-02-08 DIAGNOSIS — D120 Benign neoplasm of cecum: Secondary | ICD-10-CM | POA: Diagnosis present

## 2023-02-08 DIAGNOSIS — T39095S Adverse effect of salicylates, sequela: Secondary | ICD-10-CM

## 2023-02-08 DIAGNOSIS — K317 Polyp of stomach and duodenum: Secondary | ICD-10-CM | POA: Diagnosis not present

## 2023-02-08 DIAGNOSIS — R140 Abdominal distension (gaseous): Secondary | ICD-10-CM

## 2023-02-08 DIAGNOSIS — K911 Postgastric surgery syndromes: Secondary | ICD-10-CM

## 2023-02-08 DIAGNOSIS — K319 Disease of stomach and duodenum, unspecified: Secondary | ICD-10-CM | POA: Diagnosis not present

## 2023-02-08 DIAGNOSIS — Z86010 Personal history of colonic polyps: Secondary | ICD-10-CM

## 2023-02-08 HISTORY — PX: COLONOSCOPY: SHX174

## 2023-02-08 HISTORY — DX: Personal history of colonic polyps: Z86.010

## 2023-02-08 HISTORY — DX: Personal history of adenomatous and serrated colon polyps: Z86.0101

## 2023-02-08 MED ORDER — SODIUM CHLORIDE 0.9 % IV SOLN: 500.0000 mL | Freq: Once | INTRAVENOUS | Status: DC

## 2023-02-08 MED ORDER — PANTOPRAZOLE SODIUM 40 MG PO TBEC
40.0000 mg | DELAYED_RELEASE_TABLET | Freq: Every day | ORAL | Status: DC
Start: 2023-02-08 — End: 2023-04-13

## 2023-02-08 NOTE — Op Note (Signed)
[facility] Patient Name: Alexandra Lewis Procedure Date: 02/08/2023 2:53 PM MRN: [id number] Endoscopist: Iva Boop , MD, [id number] Age: 67 Referring MD:  Date of Birth: 09-01-1955 Gender: Female Account #: [id number] Procedure:                Colonoscopy Indications:              Change in bowel habits Medicines:                Monitored Anesthesia Care Procedure:                Pre-Anesthesia Assessment:                           - Prior to the procedure, a History and Physical                            was performed, and patient medications and                            allergies were reviewed. The patient's tolerance of                            previous anesthesia was also reviewed. The risks                            and benefits of the procedure and the sedation                            options and risks were discussed with the patient.                            All questions were answered, and informed consent                            was obtained. Prior Anticoagulants: The patient has                            taken no anticoagulant or antiplatelet agents. ASA                            Grade Assessment: II - A patient with mild systemic                            disease. After reviewing the risks and benefits,                            the patient was deemed in satisfactory condition to                            undergo the procedure.                           After obtaining informed consent, the colonoscope  was passed under direct vision. Throughout the                            procedure, the patient's blood pressure, pulse, and                            oxygen saturations were monitored continuously. The                            [id number]-[id number] Colonoscope [id number] was introduced                            through the anus and advanced to the the cecum,                            identified by appendiceal orifice  and ileocecal                            valve. The colonoscopy was performed without                            difficulty. The patient tolerated the procedure                            well. The quality of the bowel preparation was                            adequate. The ileocecal valve, appendiceal orifice,                            and rectum were photographed but images were lost. Findings:                 The perianal and digital rectal examinations were                            normal.                           A 1 mm polyp was found in the cecum. The polyp was                            sessile. The polyp was removed with a cold biopsy                            forceps. Resection and retrieval were complete.                            Verification of patient identification for the                            specimen was done. Estimated blood loss was minimal.                           External and  internal hemorrhoids were found.                           The exam was otherwise without abnormality on                            direct and retroflexion views. Complications:            No immediate complications. Estimated Blood Loss:     Estimated blood loss: none. Impression:               - One 1 mm polyp in the cecum, removed with a cold                            biopsy forceps. Resected and retrieved.                           - External and internal hemorrhoids.                           - The examination was otherwise normal on direct                            and retroflexion views. Images were not able to be                            incorporated into report. Recommendation:           - Patient has a contact number available for                            emergencies. The signs and symptoms of potential                            delayed complications were discussed with the                            patient. Return to normal activities tomorrow.                             Written discharge instructions were provided to the                            patient.                           - Resume previous diet.                           - Continue present medications.                           - Await pathology results.                           - See the other procedure note for documentation of  additional recommendations. Iva Boop, MD 02/08/2023 3:53:47 PM This report has been signed electronically.

## 2023-02-08 NOTE — Patient Instructions (Addendum)
Handouts provided about polyps and gastritis.  Resume previous diet.  Continue present medications.  Await pathology results.   The stomach looked inflamed and there was a possible polyp there also - biopsies taken.  One tiny polyp removed from the colon - otherwise ok.  I will let you know pathology results and when to have another routine colonoscopy by mail and/or My Chart.  I appreciate the opportunity to care for you. Iva Boop, MD, FACG   YOU HAD AN ENDOSCOPIC PROCEDURE TODAY AT THE [facility]:   Refer to the procedure report that was given to you for any specific questions about what was found during the examination.  If the procedure report does not answer your questions, please call your gastroenterologist to clarify.  If you requested that your care partner not be given the details of your procedure findings, then the procedure report has been included in a sealed envelope for you to review at your convenience later.  YOU SHOULD EXPECT: Some feelings of bloating in the abdomen. Passage of more gas than usual.  Walking can help get rid of the air that was put into your GI tract during the procedure and reduce the bloating. If you had a lower endoscopy (such as a colonoscopy or flexible sigmoidoscopy) you may notice spotting of blood in your stool or on the toilet paper. If you underwent a bowel prep for your procedure, you may not have a normal bowel movement for a few days.  Please Note:  You might notice some irritation and congestion in your nose or some drainage.  This is from the oxygen used during your procedure.  There is no need for concern and it should clear up in a day or so.  SYMPTOMS TO REPORT IMMEDIATELY:  Following lower endoscopy (colonoscopy or flexible sigmoidoscopy):  Excessive amounts of blood in the stool  Significant tenderness or worsening of abdominal pains  Swelling of the abdomen that is new, acute  Fever of 100F or  higher  Following upper endoscopy (EGD)  Vomiting of blood or coffee ground material  New chest pain or pain under the shoulder blades  Painful or persistently difficult swallowing  New shortness of breath  Fever of 100F or higher  Black, tarry-looking stools  For urgent or emergent issues, a gastroenterologist can be reached at any hour by calling [phone]. Do not use MyChart messaging for urgent concerns.    DIET:  We do recommend a small meal at first, but then you may proceed to your regular diet.  Drink plenty of fluids but you should avoid alcoholic beverages for 24 hours.  ACTIVITY:  You should plan to take it easy for the rest of today and you should NOT DRIVE or use heavy machinery until tomorrow (because of the sedation medicines used during the test).    FOLLOW UP: Our staff will call the number listed on your records the next business day following your procedure.  We will call around 7:15- 8:00 am to check on you and address any questions or concerns that you may have regarding the information given to you following your procedure. If we do not reach you, we will leave a message.     If any biopsies were taken you will be contacted by phone or by letter within the next 1-3 weeks.  Please call us at [phone] if you have not heard about the biopsies in 3 weeks.    SIGNATURES/CONFIDENTIALITY: You and/or your care partner  have signed paperwork which will be entered into your electronic medical record.  These signatures attest to the fact that that the information above on your After Visit Summary has been reviewed and is understood.  Full responsibility of the confidentiality of this discharge information lies with you and/or your care-partner.

## 2023-02-08 NOTE — Progress Notes (Signed)
[facility] History and Physical   Primary Care Physician:  Sherlene Shams, MD   Reason for Procedure:   Epigastric pain, altered bowel habits  Plan:    EGD and colonoscopy    HPI: Alexandra Lewis is a 67 y.o. female hre to evaluate epigastric pain and altered bowel habits. Office visit 08/2022. Had to reschedule original endoscopy appointments.    Wt Readings from Last 3 Encounters:  02/08/23 93 lb (42.2 kg)  01/23/23 93 lb (42.2 kg)  11/28/22 95 lb 9.6 oz (43.4 kg)      Past Medical History:  Diagnosis Date   Adverse effect of salicylate- ulcer 03/26/2013   Anemia    Chronic back pain    Chronic Migraine Headaches    CVA (cerebrovascular accident) (HCC) 12/05/2011   Left CVA(corona radiata); Right sided weakness   Depression    Dysplastic nevus 06/2011   Left abdomen s/p excision 07/2011   GERD (gastroesophageal reflux disease)    History of dysplastic nevus 08/10/2011   left abdomen, severe atypia/excision   History of peptic ulcer disease    IBS (irritable bowel syndrome)    Loss of weight    NSAID-induced gastric ulcer- pylorus 01/17/2013   Osteoporosis    T6 compression fracture   Partial gastric outlet obstruction 07/04/2006   Postgastric surgery syndromes    Pyloric stenosis 11/25/2008   S/P Pyloroplasty- 06/29/09 - Dr. Daphine Deutscher   RA (rheumatoid arthritis) [facility])    Seizure (HCC) 2000   Seizures (HCC)     Past Surgical History:  Procedure Laterality Date   BREAST SURGERY Bilateral    ESOPHAGOGASTRODUODENOSCOPY  04/13/2009   S/P EGD with dilitation of pylorus-Dr. Ewing Schlein   ESOPHAGOGASTRODUODENOSCOPY  11/25/2008   Status post EGD with dilatation Dr. Ewing Schlein   ESOPHAGOGASTRODUODENOSCOPY  07/04/2006   S/P EGD and Bx-Dr. Randa Evens   EXCISION MORTON'S NEUROMA  1983   Right foot   Pyloroplasty with Vagotomy  06/29/2009   S/P pyloroplasty with laparoscopic truncal vagotomy and hiatal hernia repair-07/19/2009 Dr. Carlis Abbott  1982    SKIN BIOPSY     TOTAL ABDOMINAL HYSTERECTOMY  2000   [facility]-Dr. DeFrancesco    Prior to Admission medications   Medication Sig Start Date End Date Taking? Authorizing Provider  acetaminophen-codeine (TYLENOL #3) 300-30 MG tablet TAKE 1 TABLET BY MOUTH EVERY 4 (FOUR) HOURS AS NEEDED FOR MODERATE PAIN 01/17/23  Yes Sherlene Shams, MD  divalproex (DEPAKOTE) 125 MG DR tablet TAKE 1 TABLET BY MOUTH DAILY 07/24/22  Yes Sherlene Shams, MD  ergocalciferol (VITAMIN D2) 1.25 MG (50000 UT) capsule Take 2 capsules (100,000 Units total) by mouth once a week Patient taking differently: Take 50,000 Units by mouth once a week. 02/20/22  Yes Sherlene Shams, MD  Famotidine (PEPCID PO) Take 1 tablet by mouth daily with supper.   Yes [provider]  pantoprazole (PROTONIX) 40 MG tablet Take 1 tablet (40 mg total) by mouth 2 (two) times daily. 08/31/22  Yes Iva Boop, MD  cyanocobalamin (VITAMIN B12) 1000 MCG/ML injection INJECT 1 ML (1,000 MCG TOTAL) INTO THE SKIN EVERY 14 (FOURTEEN) DAYS. 12/04/22 12/04/23  Sherlene Shams, MD  diazepam (VALIUM) 2 MG tablet Take 1 tablet (2 mg total) by mouth every 12 (twelve) hours as needed. for anxiety 12/04/22   Sherlene Shams, MD  etanercept (ENBREL SURECLICK) 50 MG/ML injection Inject 1 mL (50 mg total) subcutaneously once a week 09/06/22     INS SYRINGE/NEEDLE 1CC/28G (  B-D INSULIN SYRINGE 1CC/28G) 28G X 1/2" 1 ML MISC For use with b12 injections 03/31/16   Sherlene Shams, MD  lidocaine-prilocaine (EMLA) cream APPLY 1 APPLICATION TO THE SKIN AS NEEDED 08/04/22   Sherlene Shams, MD  ondansetron (ZOFRAN) 4 MG tablet TAKE 1 TABLET BY MOUTH EVERY 8 HOURS AS NEEDED FOR NAUSEA OR VOMITING 03/07/22   Sherlene Shams, MD  SYRINGE-NEEDLE, DISP, 3 ML 25G X 1" 3 ML MISC Use to inject B12. 06/28/21   Sherlene Shams, MD  triamcinolone cream (KENALOG) 0.1 % Apply 1 Application topically 2 (two) times daily. Patient not taking: Reported on 01/23/2023 11/28/22   Sherlene Shams, MD  TUBERCULIN SYR 1CC/27GX1/2" (B-D TB SYRINGE 1CC/27GX1/2") 27G X 1/2" 1 ML MISC Use to inject methotrexate 09/28/20     ZOLMitriptan (ZOMIG) 2.5 MG tablet Take 0.5-1 tablets (1.25-2.5 mg total) by mouth daily as needed for migraines. 09/08/22   Sherlene Shams, MD    Current Outpatient Medications  Medication Sig Dispense Refill   acetaminophen-codeine (TYLENOL #3) 300-30 MG tablet TAKE 1 TABLET BY MOUTH EVERY 4 (FOUR) HOURS AS NEEDED FOR MODERATE PAIN 180 tablet 5   divalproex (DEPAKOTE) 125 MG DR tablet TAKE 1 TABLET BY MOUTH DAILY 90 tablet 2   ergocalciferol (VITAMIN D2) 1.25 MG (50000 UT) capsule Take 2 capsules (100,000 Units total) by mouth once a week (Patient taking differently: Take 50,000 Units by mouth once a week.) 8 capsule 5   Famotidine (PEPCID PO) Take 1 tablet by mouth daily with supper.     pantoprazole (PROTONIX) 40 MG tablet Take 1 tablet (40 mg total) by mouth 2 (two) times daily. 180 tablet 3   cyanocobalamin (VITAMIN B12) 1000 MCG/ML injection INJECT 1 ML (1,000 MCG TOTAL) INTO THE SKIN EVERY 14 (FOURTEEN) DAYS. 6 mL 5   diazepam (VALIUM) 2 MG tablet Take 1 tablet (2 mg total) by mouth every 12 (twelve) hours as needed. for anxiety 60 tablet 0   etanercept (ENBREL SURECLICK) 50 MG/ML injection Inject 1 mL (50 mg total) subcutaneously once a week 12 mL 1   INS SYRINGE/NEEDLE 1CC/28G (B-D INSULIN SYRINGE 1CC/28G) 28G X 1/2" 1 ML MISC For use with b12 injections 10 each 3   lidocaine-prilocaine (EMLA) cream APPLY 1 APPLICATION TO THE SKIN AS NEEDED 30 g 0   ondansetron (ZOFRAN) 4 MG tablet TAKE 1 TABLET BY MOUTH EVERY 8 HOURS AS NEEDED FOR NAUSEA OR VOMITING 30 tablet 4   SYRINGE-NEEDLE, DISP, 3 ML 25G X 1" 3 ML MISC Use to inject B12. 6 each 0   triamcinolone cream (KENALOG) 0.1 % Apply 1 Application topically 2 (two) times daily. (Patient not taking: Reported on 01/23/2023) 30 g 0   TUBERCULIN SYR 1CC/27GX1/2" (B-D TB SYRINGE 1CC/27GX1/2") 27G X 1/2" 1 ML MISC Use  to inject methotrexate 10 each 1   ZOLMitriptan (ZOMIG) 2.5 MG tablet Take 0.5-1 tablets (1.25-2.5 mg total) by mouth daily as needed for migraines. 8 tablet 5   Current Facility-Administered Medications  Medication Dose Route Frequency Provider Last Rate Last Admin   0.9 %  sodium chloride infusion  500 mL Intravenous Once Iva Boop, MD        Allergies as of 02/08/2023 - Review Complete 02/08/2023  Allergen Reaction Noted   Methotrexate Nausea Only 01/27/2020   Promethazine hcl  06/22/2010   Sumatriptan Other (See Comments) 03/07/2022   Celecoxib Rash 05/27/2013   Rizatriptan Palpitations 03/07/2022   Tape Rash 02/03/2014  Family History  Problem Relation Age of Onset   Diabetes Mother    Hypertension Mother    Breast cancer Mother    Uterine cancer Mother    GER disease Father    GER disease Sister    Migraines Sister    GER disease Sister    GER disease Brother    Hypertension Brother    Hyperlipidemia Brother    Heart disease Paternal Uncle    Heart disease Paternal Grandmother    Colon polyps Neg Hx    Crohn's disease Neg Hx    Esophageal cancer Neg Hx    Rectal cancer Neg Hx    Stomach cancer Neg Hx     Social History   Socioeconomic History   Marital status: Married    Spouse name: Not on file   Number of children: 0   Years of education: Not on file   Highest education level: Associate degree: occupational, Scientist, product/process development, or vocational program  Occupational History   Occupation: retired  Tobacco Use   Smoking status: Never   Smokeless tobacco: Never  Vaping Use   Vaping status: Never Used  Substance and Sexual Activity   Alcohol use: No   Drug use: No   Sexual activity: Not on file  Other Topics Concern   Not on file  Social History Narrative   She is married to Dr. Kristin Bruins. (Dental medicine)   No children.   No history of smoking, drinking alcohol, or other illicit drug use.   Social Determinants of Health   Financial Resource Strain:  Low Risk  (11/27/2022)   Overall Financial Resource Strain (CARDIA)    Difficulty of Paying Living Expenses: Not hard at all  Food Insecurity: No Food Insecurity (11/27/2022)   Hunger Vital Sign    Worried About Running Out of Food in the Last Year: Never true    Ran Out of Food in the Last Year: Never true  Transportation Needs: No Transportation Needs (11/27/2022)   PRAPARE - Administrator, Civil Service (Medical): No    Lack of Transportation (Non-Medical): No  Physical Activity: Unknown (11/27/2022)   Exercise Vital Sign    Days of Exercise per Week: 0 days    Minutes of Exercise per Session: Not on file  Stress: Stress Concern Present (11/27/2022)   [employer] of Occupational Health - Occupational Stress Questionnaire    Feeling of Stress : To some extent  Social Connections: Moderately Isolated (11/27/2022)   Social Connection and Isolation Panel [NHANES]    Frequency of Communication with Friends and Family: More than three times a week    Frequency of Social Gatherings with Friends and Family: Once a week    Attends Religious Services: Never    Database administrator or Organizations: No    Attends Engineer, structural: Not on file    Marital Status: Married  Catering manager Violence: Not on file    Review of Systems:  All other review of systems negative except as mentioned in the HPI.  Physical Exam: Vital signs BP 131/86   Pulse 84   Temp 97.9 F (36.6 C)   Ht 5\' 2"  (1.575 m)   Wt 93 lb (42.2 kg)   SpO2 95%   BMI 17.01 kg/m   General:   Alert,  Well-developed, well-nourished, pleasant and cooperative in NAD Lungs:  Clear throughout to auscultation.   Heart:  Regular rate and rhythm; no murmurs, clicks, rubs,  or gallops. Abdomen:  Soft, nontender and nondistended. Normal bowel sounds.   Neuro/Psych:  Alert and cooperative. Normal mood and affect. A and O x 3   @Ernie Sagrero  Sena Slate, MD, [facility] [phone]  (pager) 02/08/2023 2:55 PM@

## 2023-02-08 NOTE — Progress Notes (Signed)
Pt's states no medical or surgical changes since previsit or office visit. 

## 2023-02-08 NOTE — Progress Notes (Signed)
Vss nad trans to pacu 

## 2023-02-08 NOTE — Op Note (Signed)
[facility] Patient Name: Alexandra Lewis Procedure Date: 02/08/2023 3:03 PM MRN: [id number] Endoscopist: Iva Boop , MD, [id number] Age: 67 Referring MD:  Date of Birth: 17-Feb-1956 Gender: Female Account #: [id number] Procedure:                Upper GI endoscopy Indications:              Epigastric abdominal pain Medicines:                Monitored Anesthesia Care Procedure:                Pre-Anesthesia Assessment:                           - Prior to the procedure, a History and Physical                            was performed, and patient medications and                            allergies were reviewed. The patient's tolerance of                            previous anesthesia was also reviewed. The risks                            and benefits of the procedure and the sedation                            options and risks were discussed with the patient.                            All questions were answered, and informed consent                            was obtained. Prior Anticoagulants: The patient has                            taken no anticoagulant or antiplatelet agents. ASA                            Grade Assessment: II - A patient with mild systemic                            disease. After reviewing the risks and benefits,                            the patient was deemed in satisfactory condition to                            undergo the procedure.                           After obtaining informed consent, the endoscope was  passed under direct vision. Throughout the                            procedure, the patient's blood pressure, pulse, and                            oxygen saturations were monitored continuously. The                            Olympus Scope [id number] was introduced through the                            mouth, and advanced to the second part of duodenum.                            The upper GI  endoscopy was accomplished without                            difficulty. The patient tolerated the procedure                            well. Scope In: 3:20:38 PM Scope Out: 3:35:25 PM Scope Withdrawal Time: 0 hours 10 minutes 12 seconds  Total Procedure Duration: 0 hours 14 minutes 47 seconds  Findings:                 A single diminutive semi-pedunculated polyp was                            found at the pylorus. The polyp was removed with a                            cold biopsy forceps. Resection and retrieval were                            complete. Verification of patient identification                            for the specimen was done. Estimated blood loss was                            minimal.                           Diffuse moderate inflammation characterized by                            erythema and friability was found in the entire                            examined stomach. Biopsies were taken with a cold                            forceps for histology. Verification of patient  identification for the specimen was done. Estimated                            blood loss was minimal.                           The exam was otherwise without abnormality.                           The cardia and gastric fundus were normal on                            retroflexion. Complications:            No immediate complications. Estimated Blood Loss:     Estimated blood loss was minimal. Impression:               - A single gastric polyp. Resected and retrieved.                           - Chronic gastritis. Biopsied.                           - The examination was otherwise normal. Recommendation:           - Patient has a contact number available for                            emergencies. The signs and symptoms of potential                            delayed complications were discussed with the                            patient. Return to normal  activities tomorrow.                            Written discharge instructions were provided to the                            patient.                           - Resume previous diet.                           - Continue present medications.                           - Await pathology results.                           - See the other procedure note for documentation of                            additional recommendations. Iva Boop, MD 02/08/2023 3:51:45 PM This report has been signed electronically.

## 2023-02-08 NOTE — Progress Notes (Signed)
Called to room to assist during endoscopic procedure.  Patient ID and intended procedure confirmed with present staff. Received instructions for my participation in the procedure from the performing physician.  

## 2023-02-08 NOTE — Progress Notes (Signed)
Reviewed in recovery - will reduce pantoprazole to every day  (Nephrologist raised some concern about this affecting GFR though I doubt that is the case reasonable to try every day)

## 2023-02-10 ENCOUNTER — Encounter (HOSPITAL_COMMUNITY): Payer: Self-pay

## 2023-02-12 ENCOUNTER — Telehealth: Payer: Self-pay

## 2023-02-12 NOTE — Telephone Encounter (Signed)
Left message on follow up call. 

## 2023-02-13 LAB — SURGICAL PATHOLOGY

## 2023-02-14 ENCOUNTER — Ambulatory Visit: Payer: Medicare Other | Admitting: Dermatology

## 2023-02-15 ENCOUNTER — Encounter: Payer: Self-pay | Admitting: Internal Medicine

## 2023-02-15 ENCOUNTER — Other Ambulatory Visit: Payer: Self-pay

## 2023-02-15 ENCOUNTER — Telehealth: Payer: Self-pay | Admitting: Internal Medicine

## 2023-02-15 MED ORDER — HYDROCORTISONE (PERIANAL) 2.5 % EX CREA: 1.0000 | TOPICAL_CREAM | Freq: Two times a day (BID) | CUTANEOUS | 1 refills | Status: DC

## 2023-02-15 NOTE — Progress Notes (Signed)
Specialty Pharmacy Refill Coordination Note  Alexandra Lewis is a 67 y.o. female contacted today regarding refills of specialty medication(s) Etanercept .  Patient requested Delivery  on 02/21/23  to verified address [address]  [place] [address]   Medication will be filled on 02/20/23.

## 2023-02-15 NOTE — Telephone Encounter (Signed)
I spoke to husband Ron about her biopsy results from EGD and colonoscopy.  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  I suspect bile reflux may be contributing to the gastropathy status post pyloroplasty.  We have no plans to change therapy for that at this point.  I did discuss how mirtazapine or Remeron could help nausea and appetite and insomnia and he will discuss that with her and let me know if that is something she wants to try.   She had a 1 mm colon adenoma.  We can place a 10-year colonoscopy recall and consider colonoscopy at that time.  She will be 77, and with her chronic illness it might not make sense to repeat 1 then.   Her hemorrhoids are swollen they were at the time of colonoscopy, internal/external.  Hydrocortisone cream prescribed.

## 2023-02-20 ENCOUNTER — Ambulatory Visit: Payer: Medicare Other | Admitting: Dermatology

## 2023-02-26 ENCOUNTER — Other Ambulatory Visit: Payer: Self-pay | Admitting: Internal Medicine

## 2023-02-26 ENCOUNTER — Other Ambulatory Visit: Payer: Self-pay

## 2023-02-26 MED ORDER — ZOLMITRIPTAN 2.5 MG PO TABS
1.2500 mg | ORAL_TABLET | Freq: Every day | ORAL | 5 refills | Status: AC | PRN
  Filled 2023-02-26: qty 8, 28d supply, fill #0
  Filled 2023-10-16: qty 6, 6d supply, fill #1
  Filled 2023-10-22: qty 30, 30d supply, fill #2

## 2023-02-28 ENCOUNTER — Other Ambulatory Visit: Payer: Self-pay | Admitting: Internal Medicine

## 2023-03-01 ENCOUNTER — Other Ambulatory Visit (HOSPITAL_COMMUNITY): Payer: Self-pay

## 2023-03-01 ENCOUNTER — Other Ambulatory Visit: Payer: Self-pay

## 2023-03-01 MED ORDER — DIAZEPAM 2 MG PO TABS
2.0000 mg | ORAL_TABLET | Freq: Two times a day (BID) | ORAL | 5 refills | Status: DC | PRN
  Filled 2023-03-01: qty 60, 30d supply, fill #0
  Filled 2023-05-15: qty 60, 30d supply, fill #1
  Filled 2023-07-19: qty 60, 30d supply, fill #2

## 2023-03-12 ENCOUNTER — Encounter: Payer: Self-pay | Admitting: Internal Medicine

## 2023-03-12 ENCOUNTER — Other Ambulatory Visit (HOSPITAL_COMMUNITY): Payer: Self-pay

## 2023-03-12 ENCOUNTER — Other Ambulatory Visit: Payer: Self-pay

## 2023-03-12 DIAGNOSIS — N814 Uterovaginal prolapse, unspecified: Secondary | ICD-10-CM

## 2023-03-13 ENCOUNTER — Other Ambulatory Visit (HOSPITAL_COMMUNITY): Payer: Self-pay

## 2023-03-13 ENCOUNTER — Other Ambulatory Visit: Payer: Self-pay

## 2023-03-13 ENCOUNTER — Encounter (HOSPITAL_COMMUNITY): Payer: Self-pay

## 2023-03-13 MED ORDER — ORENCIA CLICKJECT 125 MG/ML ~~LOC~~ SOAJ
125.0000 mg | SUBCUTANEOUS | 1 refills | Status: DC
  Filled 2023-03-13 (×2): qty 4, 28d supply, fill #0
  Filled 2023-04-05: qty 4, 28d supply, fill #1
  Filled 2023-04-27: qty 4, 28d supply, fill #2

## 2023-03-13 NOTE — Progress Notes (Signed)
Specialty Pharmacy Initiation Note   Alexandra Lewis is a 67 y.o. female who will be followed by the specialty pharmacy service for RxSp Rheumatoid Arthritis    Review of administration, indication, effectiveness, safety, potential side effects, and storage/disposable occurred today for patient's specialty medication(s) Abatacept     Patient/Caregiver did not have any additional questions or concerns.   Patient's therapy is appropriate to: Initiate    Goals Addressed             This Visit's Progress    Reduce inflammation       Patient is on track. Patient will maintain adherence         Bobette Mo Specialty Pharmacist

## 2023-03-13 NOTE — Progress Notes (Signed)
Specialty Pharmacy Initial Fill Coordination Note  Alexandra Lewis is a 67 y.o. female contacted today regarding refills of specialty medication(s) Abatacept   Patient requested Delivery   Delivery date: 03/14/23   Verified address: [address]   Medication will be filled on 10/22.   Patient is aware of $0 copayment.

## 2023-03-14 LAB — LAB REPORT - SCANNED: EGFR: 65

## 2023-03-21 ENCOUNTER — Other Ambulatory Visit: Payer: Self-pay | Admitting: Internal Medicine

## 2023-03-22 ENCOUNTER — Other Ambulatory Visit: Payer: Self-pay

## 2023-03-22 ENCOUNTER — Other Ambulatory Visit (HOSPITAL_COMMUNITY): Payer: Self-pay

## 2023-03-22 MED ORDER — ERGOCALCIFEROL 1.25 MG (50000 UT) PO CAPS
50000.0000 [IU] | ORAL_CAPSULE | ORAL | 5 refills | Status: AC
  Filled 2023-03-22: qty 12, 84d supply, fill #0
  Filled 2023-07-07: qty 12, 84d supply, fill #1
  Filled 2023-10-06: qty 12, 84d supply, fill #2
  Filled 2024-02-14: qty 12, 84d supply, fill #3

## 2023-03-29 ENCOUNTER — Other Ambulatory Visit (HOSPITAL_COMMUNITY): Payer: Self-pay

## 2023-04-03 ENCOUNTER — Other Ambulatory Visit: Payer: Self-pay

## 2023-04-04 ENCOUNTER — Ambulatory Visit: Payer: Medicare Other | Admitting: Obstetrics and Gynecology

## 2023-04-05 ENCOUNTER — Other Ambulatory Visit (HOSPITAL_COMMUNITY): Payer: Self-pay

## 2023-04-05 ENCOUNTER — Encounter: Payer: Self-pay | Admitting: Internal Medicine

## 2023-04-05 NOTE — Progress Notes (Signed)
Specialty Pharmacy Refill Coordination Note  Alexandra Lewis is a 67 y.o. female contacted today regarding refills of specialty medication(s) Abatacept   Patient requested Daryll Drown at [facility] [place] date: 04/09/23   Medication will be filled on 04/06/23.

## 2023-04-06 ENCOUNTER — Other Ambulatory Visit: Payer: Self-pay

## 2023-04-09 ENCOUNTER — Other Ambulatory Visit (HOSPITAL_COMMUNITY): Payer: Self-pay

## 2023-04-10 ENCOUNTER — Encounter: Payer: Self-pay | Admitting: Internal Medicine

## 2023-04-10 ENCOUNTER — Telehealth (INDEPENDENT_AMBULATORY_CARE_PROVIDER_SITE_OTHER): Payer: Medicare Other | Admitting: Internal Medicine

## 2023-04-10 VITALS — Ht 62.0 in | Wt 94.0 lb

## 2023-04-10 DIAGNOSIS — G894 Chronic pain syndrome: Secondary | ICD-10-CM

## 2023-04-10 DIAGNOSIS — T39095S Adverse effect of salicylates, sequela: Secondary | ICD-10-CM

## 2023-04-10 MED ORDER — ACETAMINOPHEN-CODEINE 300-30 MG PO TABS: 1.0000 | ORAL_TABLET | ORAL | 5 refills | Status: DC | PRN

## 2023-04-10 MED ORDER — BUTALBITAL-ASA-CAFF-CODEINE 50-325-40-30 MG PO CAPS: 1.0000 | ORAL_CAPSULE | Freq: Three times a day (TID) | ORAL | 5 refills | Status: DC | PRN

## 2023-04-10 NOTE — Progress Notes (Signed)
Virtual Visit via Caregility   Note   This format is felt to be most appropriate for this patient at this time.  All issues noted in this document were discussed and addressed.  No physical exam was performed (except for noted visual exam findings with Video Visits).   I connected with Alexandra Lewis  on Alexandra at  5:00 PM Lewis by a video enabled telemedicine application  and verified that I am speaking with the correct person using two identifiers. Location patient: home Location provider: work or home office Persons participating in the virtual visit: patient, provider  I discussed the limitations, risks, security and privacy concerns of performing an evaluation and management service by telephone and the availability of in person appointments. I also discussed with the patient that there may be a patient responsible charge related to this service. The patient expressed understanding and agreed to proceed.   Reason for visit: change in pain management   HPI: 67 yr old female with complicated GI history of gastric ulcer requiring gastrectomy , chronic pain and rheumatoid arthritis presents of review of pain management following suspension of fiorinal  after gfr dropped  She was evaluated by [facility] nephrologist [place]  for drop in GFR and advised to resume fiorinal at a maximal dose of 3 daily, and double her oral intake of water   Her pain without the fiorinal has been significant and she has resumed taking aspirin twice daily    ROS: See pertinent positives and negatives per HPI.  Past Medical History:  Diagnosis Date   Adverse effect of salicylate- ulcer 03/26/2013   Anemia    Chronic back pain    Chronic Migraine Headaches    CVA (cerebrovascular accident) (HCC) 12/05/2011   Left CVA(corona radiata); Right sided weakness   Depression    Dysplastic nevus 06/2011   Left abdomen s/p excision 07/2011   GERD (gastroesophageal reflux disease)    History of dysplastic nevus 08/10/2011   left  abdomen, severe atypia/excision   History of peptic ulcer disease    Hx of adenomatous polyp of colon 02/08/2023   1 mm adenoma   IBS (irritable bowel syndrome)    Loss of weight    NSAID-induced gastric ulcer- pylorus 01/17/2013   Osteoporosis    T6 compression fracture   Partial gastric outlet obstruction 07/04/2006   Postgastric surgery syndromes    Pyloric stenosis 11/25/2008   S/P Pyloroplasty- 06/29/09 - Dr. Daphine Deutscher   RA (rheumatoid arthritis) [facility])    Seizure (HCC) 2000   Seizures (HCC)     Past Surgical History:  Procedure Laterality Date   BREAST SURGERY Bilateral    COLONOSCOPY  02/08/2023   1 mm adenoma   ESOPHAGOGASTRODUODENOSCOPY  04/13/2009   S/P EGD with dilitation of pylorus-Dr. Ewing Schlein   ESOPHAGOGASTRODUODENOSCOPY  11/25/2008   Status post EGD with dilatation Dr. Ewing Schlein   ESOPHAGOGASTRODUODENOSCOPY  07/04/2006   S/P EGD and Bx-Dr. Randa Evens   EXCISION MORTON'S NEUROMA  1983   Right foot   Pyloroplasty with Vagotomy  06/29/2009   S/P pyloroplasty with laparoscopic truncal vagotomy and hiatal hernia repair-07/19/2009 Dr. Carlis Abbott  1982   SKIN BIOPSY     TOTAL ABDOMINAL HYSTERECTOMY  2000   [facility]-Dr. DeFrancesco    Family History  Problem Relation Age of Onset   Diabetes Mother    Hypertension Mother    Breast cancer Mother    Uterine cancer Mother    GER disease Father    GER disease Sister  Migraines Sister    GER disease Sister    GER disease Brother    Hypertension Brother    Hyperlipidemia Brother    Heart disease Paternal Uncle    Heart disease Paternal Grandmother    Colon polyps Neg Hx    Crohn's disease Neg Hx    Esophageal cancer Neg Hx    Rectal cancer Neg Hx    Stomach cancer Neg Hx     SOCIAL HX: retired Sales executive.   reports that she has never smoked. She has never used smokeless tobacco. She reports that she does not drink alcohol and does not use drugs.    Current Outpatient Medications:     Abatacept (ORENCIA CLICKJECT) 125 MG/ML SOAJ, Inject 125 mg into the skin every 7 (seven) days., Disp: 12 mL, Rfl: 1   butalbital-aspirin-caffeine-codeine (FIORINAL/CODEINE #3) 50-325-40-30 MG capsule, Take 1 capsule by mouth every 8 (eight) hours as needed for pain., Disp: 90 capsule, Rfl: 5   cyanocobalamin (VITAMIN B12) 1000 MCG/ML injection, INJECT 1 ML (1,000 MCG TOTAL) INTO THE SKIN EVERY 14 (FOURTEEN) DAYS., Disp: 6 mL, Rfl: 5   diazepam (VALIUM) 2 MG tablet, Take 1 tablet (2 mg total) by mouth every 12 (twelve) hours as needed. for anxiety, Disp: 60 tablet, Rfl: 5   divalproex (DEPAKOTE) 125 MG DR tablet, TAKE 1 TABLET BY MOUTH DAILY, Disp: 90 tablet, Rfl: 2   ergocalciferol (VITAMIN D2) 1.25 MG (50000 UT) capsule, Take 1 capsule (50,000 Units total) by mouth once a week., Disp: 12 capsule, Rfl: 5   Famotidine (PEPCID PO), Take 1 tablet by mouth daily with supper., Disp: , Rfl:    hydrocortisone (ANUSOL-HC) 2.5 % rectal cream, Place 1 Application rectally 2 (two) times daily. After bowel movement and at at bedtime are optimal (Patient not taking: Reported on 04/10/2023), Disp: 30 g, Rfl: 1   INS SYRINGE/NEEDLE 1CC/28G (B-D INSULIN SYRINGE 1CC/28G) 28G X 1/2" 1 ML MISC, For use with b12 injections, Disp: 10 each, Rfl: 3   ondansetron (ZOFRAN) 4 MG tablet, TAKE 1 TABLET BY MOUTH EVERY 8 HOURS AS NEEDED FOR NAUSEA OR VOMITING, Disp: 30 tablet, Rfl: 4   pantoprazole (PROTONIX) 40 MG tablet, Take 1 tablet (40 mg total) by mouth daily., Disp: , Rfl:    SYRINGE-NEEDLE, DISP, 3 ML 25G X 1" 3 ML MISC, Use to inject B12., Disp: 6 each, Rfl: 0   TUBERCULIN SYR 1CC/27GX1/2" (B-D TB SYRINGE 1CC/27GX1/2") 27G X 1/2" 1 ML MISC, Use to inject methotrexate, Disp: 10 each, Rfl: 1   ZOLMitriptan (ZOMIG) 2.5 MG tablet, Take 0.5-1 tablets (1.25-2.5 mg total) by mouth daily as needed for migraines., Disp: 8 tablet, Rfl: 5   acetaminophen-codeine (TYLENOL #3) 300-30 MG tablet, Take 1 tablet by mouth every 4 (four)  hours as needed for moderate pain (pain score 4-6)., Disp: 180 tablet, Rfl: 5   lidocaine-prilocaine (EMLA) cream, APPLY 1 APPLICATION TO THE SKIN AS NEEDED (Patient not taking: Reported on 04/10/2023), Disp: 30 g, Rfl: 0   triamcinolone cream (KENALOG) 0.1 %, Apply 1 Application topically 2 (two) times daily. (Patient not taking: Reported on 01/23/2023), Disp: 30 g, Rfl: 0  EXAM:  VITALS per patient if applicable:  GENERAL: alert, oriented, appears well and in no acute distress  HEENT: atraumatic, conjunttiva clear, no obvious abnormalities on inspection of external nose and ears  NECK: normal movements of the head and neck  LUNGS: on inspection no signs of respiratory distress, breathing rate appears normal, no obvious gross SOB,  gasping or wheezing  CV: no obvious cyanosis  MS: moves all visible extremities without noticeable abnormality  PSYCH/NEURO: pleasant and cooperative, no obvious depression or anxiety, speech and thought processing grossly intact  ASSESSMENT AND PLAN: Adverse effect of salicylate, sequela Assessment & Plan: She has begun taking aspirin again 650 mg daily as part of pain management  . She is taking a PPI .  EGD was normal in September prior to use    Chronic pain disorder Assessment & Plan: Resuming fiorinal 3 daily and continuging  tylenol #3   qty 6 daily. Advised to stop aspirin use once forinal is resume d   Other orders -     Acetaminophen-Codeine; Take 1 tablet by mouth every 4 (four) hours as needed for moderate pain (pain score 4-6).  Dispense: 180 tablet; Refill: 5 -     Butalbital-ASA-Caff-Codeine; Take 1 capsule by mouth every 8 (eight) hours as needed for pain.  Dispense: 90 capsule; Refill: 5      I discussed the assessment and treatment plan with the patient. The patient was provided an opportunity to ask questions and all were answered. The patient agreed with the plan and demonstrated an understanding of the instructions.   The patient  was advised to call back or seek an in-person evaluation if the symptoms worsen or if the condition fails to improve as anticipated.   I spent 30 minutes dedicated to the care of this patient on the date of this encounter to include pre-visit review of hermedical history,  Face-to-face time with the patient , and post visit ordering of testing and therapeutics.    Sherlene Shams, MD

## 2023-04-10 NOTE — Assessment & Plan Note (Signed)
She has begun taking aspirin again 650 mg daily as part of pain management  . She is taking a PPI .  EGD was normal in September prior to use

## 2023-04-10 NOTE — Patient Instructions (Addendum)
Alexandra Lewis,  I DEFINITELY WANT YOU TO STOP THE ADDITIONAL ASPIRIN ONCE YOU RESUME FIORINAL#3,  BECAUSE EACH DOSE  CONTAINS 325 MG ASPIRIN; SO YOU WILL TAKING 1 GRAM DAILY OF ASPIRIN PRODUCT.   YOU WILL HAVE TO STAY ON YOUR PPI (PANTOPRAZOLE) TO MITIGATE RISK OF RECURRENT ULCER.    IF YOU DEVELOP ANY MORE GI SYMPTOMS STOP THE FIORINAL AND WE WILL  USE THE OTHER OPTION, WHICH  IS TO USE FIORICET (WHICH CONTAINS 325 MG OF TYLENOL INSTEAD OF 325 MG OF ASPIRIN) , WHICH WILL BRING YOUR TOTAL DAILY TYLENOL DOSE TO 2800 MG .   (IF YOU USE  6 TYLENOL #3 DAILY)

## 2023-04-10 NOTE — Telephone Encounter (Signed)
Error

## 2023-04-10 NOTE — Assessment & Plan Note (Signed)
Resuming fiorinal 3 daily and continuging  tylenol #3   qty 6 daily. Advised to stop aspirin use once forinal is resume d

## 2023-04-13 ENCOUNTER — Other Ambulatory Visit (HOSPITAL_COMMUNITY): Payer: Self-pay

## 2023-04-13 ENCOUNTER — Encounter: Payer: Self-pay | Admitting: Obstetrics and Gynecology

## 2023-04-13 ENCOUNTER — Other Ambulatory Visit: Payer: Self-pay | Admitting: Internal Medicine

## 2023-04-13 ENCOUNTER — Ambulatory Visit (INDEPENDENT_AMBULATORY_CARE_PROVIDER_SITE_OTHER): Payer: Medicare Other | Admitting: Obstetrics and Gynecology

## 2023-04-13 VITALS — BP 91/65 | HR 97 | Ht 62.6 in | Wt 94.2 lb

## 2023-04-13 DIAGNOSIS — N3281 Overactive bladder: Secondary | ICD-10-CM | POA: Insufficient documentation

## 2023-04-13 DIAGNOSIS — M62838 Other muscle spasm: Secondary | ICD-10-CM | POA: Diagnosis not present

## 2023-04-13 DIAGNOSIS — T39095S Adverse effect of salicylates, sequela: Secondary | ICD-10-CM

## 2023-04-13 MED ORDER — PANTOPRAZOLE SODIUM 40 MG PO TBEC: 40.0000 mg | DELAYED_RELEASE_TABLET | Freq: Every day | ORAL | Status: DC

## 2023-04-13 MED ORDER — MIRABEGRON ER 25 MG PO TB24
25.0000 mg | ORAL_TABLET | Freq: Every day | ORAL | 11 refills | Status: AC
  Filled 2023-04-13 – 2023-04-17 (×2): qty 30, 30d supply, fill #0

## 2023-04-13 NOTE — Progress Notes (Signed)
New Patient Evaluation and Consultation  Referring Provider: Sherlene Shams, MD PCP: Sherlene Shams, MD Date of Service: 04/13/2023  SUBJECTIVE Chief Complaint: New Patient (Initial Visit) (BECKY MISSOURI is a 67 y.o. female here today for female organ prolapse. )  History of Present Illness: PAYETON ROBERGE is a 67 y.o. White or Caucasian female presenting for evaluation of prolapse.     Urinary Symptoms: Does not leak urine.   Day time voids >10, has a lot of urgency.  Nocturia: 4-5 times per night to void. Voiding dysfunction:  does not empty bladder well.  Patient does not use a catheter to empty bladder.  When urinating, patient feels a weak stream, difficulty starting urine stream, dribbling after finishing, and the need to urinate multiple times in a row Has tried myrbetriq a few years ago, and it worked well. She took for a few months and then stopped.  Drinks: ensure clear, water per day. Has occasional sprite or tea  UTIs: 4 UTI's in the last year.   Denies history of blood in urine and kidney or bladder stones No results found for the last 90 days.   Pelvic Organ Prolapse Symptoms:                  Patient Denies a feeling of a bulge the vaginal area but she has some pressure.   Bowel Symptom: Bowel movements: every other day Stool consistency: soft  Straining: yes.  Splinting: no.  Incomplete evacuation: no.  Denies accidental bowel leakage / fecal incontinence Bowel regimen: stool softener  HM Colonoscopy          Colonoscopy (Every 10 Years) Next due on 02/07/2033    02/08/2023  COLONOSCOPY   Only the first 1 history entries have been loaded, but more history exists.            Sexual Function Sexually active: no.    Pelvic Pain Denies pelvic pain    Past Medical History:  Past Medical History:  Diagnosis Date   Adverse effect of salicylate- ulcer 03/26/2013   Anemia    Chronic back pain    Chronic Migraine Headaches     CVA (cerebrovascular accident) (HCC) 12/05/2011   Left CVA(corona radiata); Right sided weakness   Depression    Dysplastic nevus 06/2011   Left abdomen s/p excision 07/2011   GERD (gastroesophageal reflux disease)    History of dysplastic nevus 08/10/2011   left abdomen, severe atypia/excision   History of peptic ulcer disease    Hx of adenomatous polyp of colon 02/08/2023   1 mm adenoma   IBS (irritable bowel syndrome)    Loss of weight    NSAID-induced gastric ulcer- pylorus 01/17/2013   Osteoporosis    T6 compression fracture   Partial gastric outlet obstruction 07/04/2006   Postgastric surgery syndromes    Pyloric stenosis 11/25/2008   S/P Pyloroplasty- 06/29/09 - Dr. Daphine Deutscher   RA (rheumatoid arthritis) [facility])    Seizure (HCC) 2000   Seizures (HCC)      Past Surgical History:   Past Surgical History:  Procedure Laterality Date   BREAST SURGERY Bilateral    fibrous mass removed   COLONOSCOPY  02/08/2023   1 mm adenoma   ESOPHAGOGASTRODUODENOSCOPY  04/13/2009   S/P EGD with dilitation of pylorus-Dr. Ewing Schlein   ESOPHAGOGASTRODUODENOSCOPY  11/25/2008   Status post EGD with dilatation Dr. Ewing Schlein   ESOPHAGOGASTRODUODENOSCOPY  07/04/2006   S/P EGD and Bx-Dr. Randa Evens   EXCISION MORTON'S NEUROMA  1983   Right foot   Pyloroplasty with Vagotomy  06/29/2009   S/P pyloroplasty with laparoscopic truncal vagotomy and hiatal hernia repair-07/19/2009 Dr. Daphine Deutscher   SEPTOPLASTY  1982   SKIN BIOPSY     TOTAL ABDOMINAL HYSTERECTOMY  2000   [facility]-Dr. DeFrancesco     Past OB/GYN History: OB History  Gravida Para Term Preterm AB Living  2       2    SAB IAB Ectopic Multiple Live Births  2            # Outcome Date GA Lbr Len/2nd Weight Sex Type Anes PTL Lv  2 SAB           1 SAB             S/p hysterectomy  Medications: Patient has a current medication list which includes the following prescription(s): orencia clickject, acetaminophen-codeine,  butalbital-aspirin-caffeine-codeine, cyanocobalamin, diazepam, divalproex, ergocalciferol, famotidine, hydrocortisone, ins syringe/needle 1cc/28g, lidocaine-prilocaine, mirabegron er, ondansetron, pantoprazole, syringe-needle (disp) 3 ml, triamcinolone cream, tuberculin syr 1cc/27gx1/2", zolmitriptan, and [DISCONTINUED] enbrel sureclick.   Allergies: Patient is allergic to methotrexate, promethazine hcl, sumatriptan, celecoxib, rizatriptan, and tape.   Social History:  Social History   Tobacco Use   Smoking status: Never   Smokeless tobacco: Never  Vaping Use   Vaping status: Never Used  Substance Use Topics   Alcohol use: No   Drug use: No    Relationship status: married Patient lives with her spouse.   Patient is not employed. Regular exercise: No History of abuse: No  Family History:   Family History  Problem Relation Age of Onset   Diabetes Mother    Hypertension Mother    Breast cancer Mother    Uterine cancer Mother    GER disease Father    GER disease Sister    Migraines Sister    GER disease Sister    GER disease Brother    Hypertension Brother    Hyperlipidemia Brother    Heart disease Paternal Uncle    Heart disease Paternal Grandmother    Colon polyps Neg Hx    Crohn's disease Neg Hx    Esophageal cancer Neg Hx    Rectal cancer Neg Hx    Stomach cancer Neg Hx      Review of Systems: Review of Systems  Constitutional:  Positive for malaise/fatigue. Negative for fever and weight loss.  Respiratory:  Positive for shortness of breath. Negative for cough and wheezing.   Cardiovascular:  Negative for chest pain, palpitations and leg swelling.  Gastrointestinal:  Positive for abdominal pain. Negative for blood in stool.  Genitourinary:  Negative for dysuria.  Musculoskeletal:  Positive for myalgias.  Skin:  Negative for rash.  Neurological:  Positive for headaches. Negative for dizziness.  Endo/Heme/Allergies:  Bruises/bleeds easily.   Psychiatric/Behavioral:  Negative for depression. The patient is not nervous/anxious.      OBJECTIVE Physical Exam: Vitals:   04/13/23 1527  BP: 91/65  Pulse: 97  Weight: 94 lb 3.2 oz (42.7 kg)  Height: 5' 2.6" (1.59 m)    Physical Exam Vitals reviewed. Exam conducted with a chaperone present.  Constitutional:      General: She is not in acute distress. Pulmonary:     Effort: Pulmonary effort is normal.  Abdominal:     General: There is no distension.     Palpations: Abdomen is soft.     Tenderness: There is no abdominal tenderness. There is no rebound.  Musculoskeletal:  General: No swelling. Normal range of motion.  Skin:    General: Skin is warm and dry.     Findings: No rash.  Neurological:     Mental Status: She is alert and oriented to person, place, and time.  Psychiatric:        Mood and Affect: Mood normal.        Behavior: Behavior normal.      GU / Detailed Urogynecologic Evaluation:  Pelvic Exam: Normal external female genitalia; Bartholin's and Skene's glands normal in appearance; urethral meatus normal in appearance with small caruncle, no urethral masses or discharge.   CST: negative  s/p hysterectomy: Speculum exam reveals normal vaginal mucosa with  atrophy and normal vaginal cuff.  Adnexa normal adnexa.     Pelvic floor strength I/V  Pelvic floor musculature: Right levator tender, Right obturator tender, Left levator tender, Left obturator tender  POP-Q:   POP-Q  -3                                            Aa   -3                                           Ba  -5                                              C   1                                            Gh  2.5                                            Pb  5                                            tvl   -3                                            Ap  -3                                            Bp                                                 D       Rectal Exam:  Normal external rectum  Post-Void Residual (PVR) by Bladder Scan: In order to  evaluate bladder emptying, we discussed obtaining a postvoid residual and patient agreed to this procedure.  Procedure: The ultrasound unit was placed on the patient's abdomen in the suprapubic region after the patient had voided.      Laboratory Results: Lab Results  Component Value Date   BILIRUBINUR NEGATIVE 04/25/2022   UROBILINOGEN 0.2 04/25/2022   LEUKOCYTESUR NEGATIVE 04/25/2022    Lab Results  Component Value Date   CREATININE 1.26 (H) 11/28/2022   CREATININE 1.22 (H) 04/25/2022   CREATININE 0.97 04/29/2021    Lab Results  Component Value Date   HGBA1C 5.9 04/25/2022    Lab Results  Component Value Date   HGB 12.4 11/28/2022     ASSESSMENT AND PLAN Ms. Bromm is a 67 y.o. with:  1. Levator spasm   2. Overactive bladder     Levator spasm Assessment & Plan: - No prolapse noted today - The origin of pelvic floor muscle spasm can be multifactorial, including primary, reactive to a different pain source, trauma, or even part of a centralized pain syndrome.Treatment options include pelvic floor physical therapy, local (vaginal) or oral  muscle relaxants, pelvic muscle trigger point injections or centrally acting pain medications.   - Will start with pelvic physical therapy  Orders: -     AMB referral to rehabilitation  Overactive bladder Assessment & Plan: - We discussed the symptoms of overactive bladder (OAB), which include urinary urgency, urinary frequency, nocturia, with or without urge incontinence.  While we do not know the exact etiology of OAB, several treatment options exist. We discussed management including behavioral therapy (decreasing bladder irritants, urge suppression strategies, timed voids, bladder retraining), physical therapy, medication; for refractory cases posterior tibial nerve stimulation, sacral neuromodulation, and intravesical  botulinum toxin injection.  - Previously did well with Myrbetriq. Will prescribe again, 25mg  daily. For Beta-3 agonist medication, we discussed the potential side effect of elevated blood pressure which is more likely to occur in individuals with uncontrolled hypertension.   Orders: -     AMB referral to rehabilitation -     Mirabegron ER; Take 1 tablet (25 mg total) by mouth daily.  Dispense: 30 tablet; Refill: 11   - return 6 weeks for follow up  Marguerita Beards, MD

## 2023-04-13 NOTE — Assessment & Plan Note (Signed)
-   No prolapse noted today - The origin of pelvic floor muscle spasm can be multifactorial, including primary, reactive to a different pain source, trauma, or even part of a centralized pain syndrome.Treatment options include pelvic floor physical therapy, local (vaginal) or oral  muscle relaxants, pelvic muscle trigger point injections or centrally acting pain medications.   - Will start with pelvic physical therapy

## 2023-04-13 NOTE — Patient Instructions (Signed)
We discussed the symptoms of overactive bladder (OAB), which include urinary urgency, urinary frequency, night-time urination, with or without urge incontinence.  We discussed management including behavioral therapy (decreasing bladder irritants by following a bladder diet, urge suppression strategies, timed voids, bladder retraining), physical therapy, medication; and for refractory cases posterior tibial nerve stimulation, sacral neuromodulation, and intravesical botulinum toxin injection.   For Beta-3 agonist medication, we discussed the potential side effect of elevated blood pressure which is more likely to occur in individuals with uncontrolled hypertension. You were given a prescription for Myrbetriq 25 mg.  It can take a month to start working so give it time, but if you have bothersome side effects call sooner and we can try a different medication.  Call us if you have trouble filling the prescription or if it's not covered by your insurance.

## 2023-04-13 NOTE — Assessment & Plan Note (Signed)
-   We discussed the symptoms of overactive bladder (OAB), which include urinary urgency, urinary frequency, nocturia, with or without urge incontinence.  While we do not know the exact etiology of OAB, several treatment options exist. We discussed management including behavioral therapy (decreasing bladder irritants, urge suppression strategies, timed voids, bladder retraining), physical therapy, medication; for refractory cases posterior tibial nerve stimulation, sacral neuromodulation, and intravesical botulinum toxin injection.  - Previously did well with Myrbetriq. Will prescribe again, 25mg  daily. For Beta-3 agonist medication, we discussed the potential side effect of elevated blood pressure which is more likely to occur in individuals with uncontrolled hypertension.

## 2023-04-15 ENCOUNTER — Other Ambulatory Visit: Payer: Self-pay

## 2023-04-16 ENCOUNTER — Other Ambulatory Visit: Payer: Self-pay

## 2023-04-16 ENCOUNTER — Other Ambulatory Visit (HOSPITAL_COMMUNITY): Payer: Self-pay

## 2023-04-16 ENCOUNTER — Other Ambulatory Visit: Payer: Self-pay | Admitting: Internal Medicine

## 2023-04-16 DIAGNOSIS — K259 Gastric ulcer, unspecified as acute or chronic, without hemorrhage or perforation: Secondary | ICD-10-CM

## 2023-04-16 DIAGNOSIS — T39095S Adverse effect of salicylates, sequela: Secondary | ICD-10-CM

## 2023-04-16 MED ORDER — PANTOPRAZOLE SODIUM 40 MG PO TBEC
40.0000 mg | DELAYED_RELEASE_TABLET | Freq: Every day | ORAL | 3 refills | Status: DC
  Filled 2023-04-16: qty 90, 90d supply, fill #0

## 2023-04-16 NOTE — Telephone Encounter (Signed)
Historical prescription. Looks like it was last filled by Dr. Leone Payor.

## 2023-04-17 ENCOUNTER — Other Ambulatory Visit (HOSPITAL_COMMUNITY): Payer: Self-pay

## 2023-04-17 ENCOUNTER — Other Ambulatory Visit: Payer: Self-pay

## 2023-04-17 ENCOUNTER — Other Ambulatory Visit: Payer: Self-pay | Admitting: Internal Medicine

## 2023-04-17 DIAGNOSIS — T39095S Adverse effect of salicylates, sequela: Secondary | ICD-10-CM

## 2023-04-17 MED ORDER — PANTOPRAZOLE SODIUM 40 MG PO TBEC
40.0000 mg | DELAYED_RELEASE_TABLET | Freq: Every day | ORAL | 1 refills | Status: DC
  Filled 2023-04-17: qty 90, 90d supply, fill #0
  Filled 2023-07-11: qty 90, 90d supply, fill #1

## 2023-04-18 ENCOUNTER — Other Ambulatory Visit: Payer: Self-pay

## 2023-04-18 ENCOUNTER — Other Ambulatory Visit (HOSPITAL_COMMUNITY): Payer: Self-pay

## 2023-04-20 ENCOUNTER — Other Ambulatory Visit: Payer: Self-pay

## 2023-04-24 ENCOUNTER — Other Ambulatory Visit: Payer: Self-pay | Admitting: Internal Medicine

## 2023-04-26 ENCOUNTER — Other Ambulatory Visit (HOSPITAL_COMMUNITY): Payer: Self-pay

## 2023-04-27 ENCOUNTER — Other Ambulatory Visit: Payer: Self-pay

## 2023-04-27 NOTE — Progress Notes (Signed)
Specialty Pharmacy Refill Coordination Note  Alexandra Lewis is a 67 y.o. female contacted today regarding refills of specialty medication(s) Abatacept   Patient requested Delivery   Delivery date: 05/08/23   Verified address: [address]   Medication will be filled on 05/07/23.

## 2023-05-01 ENCOUNTER — Ambulatory Visit: Payer: Medicare Other | Admitting: Internal Medicine

## 2023-05-02 ENCOUNTER — Other Ambulatory Visit: Payer: Self-pay

## 2023-05-02 NOTE — Progress Notes (Signed)
05/02/23:  Patient's husband asked if we could send out [employer] Orencia today. Shipping 12/11- Delivery date 05/03/23.

## 2023-05-15 ENCOUNTER — Other Ambulatory Visit: Payer: Self-pay

## 2023-05-17 ENCOUNTER — Other Ambulatory Visit (HOSPITAL_COMMUNITY): Payer: Self-pay

## 2023-05-21 ENCOUNTER — Telehealth: Payer: Medicare Other | Admitting: Family Medicine

## 2023-05-21 DIAGNOSIS — R3989 Other symptoms and signs involving the genitourinary system: Secondary | ICD-10-CM

## 2023-05-21 MED ORDER — CEPHALEXIN 500 MG PO CAPS: 500.0000 mg | ORAL_CAPSULE | Freq: Two times a day (BID) | ORAL | 0 refills | Status: AC

## 2023-05-21 NOTE — Progress Notes (Signed)

## 2023-05-25 ENCOUNTER — Other Ambulatory Visit: Payer: Self-pay

## 2023-05-28 ENCOUNTER — Other Ambulatory Visit: Payer: Self-pay

## 2023-05-29 ENCOUNTER — Other Ambulatory Visit: Payer: Self-pay

## 2023-05-30 ENCOUNTER — Other Ambulatory Visit: Payer: Self-pay

## 2023-05-31 ENCOUNTER — Ambulatory Visit: Payer: Medicare Other | Admitting: Obstetrics and Gynecology

## 2023-05-31 ENCOUNTER — Other Ambulatory Visit: Payer: Self-pay

## 2023-05-31 ENCOUNTER — Other Ambulatory Visit (HOSPITAL_COMMUNITY): Payer: Self-pay

## 2023-06-01 ENCOUNTER — Other Ambulatory Visit: Payer: Self-pay

## 2023-06-01 ENCOUNTER — Other Ambulatory Visit (HOSPITAL_COMMUNITY): Payer: Self-pay

## 2023-06-01 MED ORDER — RINVOQ 15 MG PO TB24
ORAL_TABLET | ORAL | 1 refills | Status: DC
  Filled 2023-06-01 (×2): qty 30, 30d supply, fill #0

## 2023-06-01 NOTE — Progress Notes (Signed)
 Specialty Pharmacy Initial Fill Coordination Note  Alexandra Lewis is a 68 y.o. female contacted today regarding initial fill of specialty medication(s) Upadacitinib  (Rinvoq )   Patient requested Delivery   Delivery date: 06/05/23   Verified address: [address]   Medication will be filled on 1/13.   Patient is aware of $2,000 copayment.

## 2023-06-01 NOTE — Progress Notes (Signed)
 Specialty Pharmacy Initiation Note   Alexandra Lewis is a 68 y.o. female who will be followed by the specialty pharmacy service for RxSp Rheumatoid Arthritis    Review of administration, indication, effectiveness, safety, potential side effects, storage/disposable, and missed dose instructions occurred today for patient's specialty medication(s) Upadacitinib  (Rinvoq )     Patient/Caregiver did not have any additional questions or concerns.   Patient's therapy is appropriate to: Initiate    Goals Addressed             This Visit's Progress    Reduce inflammation       Patient is initiating therapy. Patient will maintain adherence         Alexandra Lewis Specialty Pharmacist

## 2023-06-02 ENCOUNTER — Other Ambulatory Visit (HOSPITAL_COMMUNITY): Payer: Self-pay

## 2023-06-02 MED ORDER — RINVOQ 15 MG PO TB24
15.0000 mg | ORAL_TABLET | Freq: Every day | ORAL | 12 refills | Status: DC
  Filled 2023-06-02: qty 30, 30d supply, fill #0

## 2023-06-04 ENCOUNTER — Other Ambulatory Visit: Payer: Self-pay

## 2023-06-04 ENCOUNTER — Other Ambulatory Visit (HOSPITAL_COMMUNITY): Payer: Self-pay

## 2023-06-05 ENCOUNTER — Other Ambulatory Visit: Payer: Self-pay

## 2023-06-20 ENCOUNTER — Other Ambulatory Visit: Payer: Self-pay

## 2023-06-22 ENCOUNTER — Other Ambulatory Visit: Payer: Self-pay

## 2023-06-25 ENCOUNTER — Other Ambulatory Visit: Payer: Self-pay

## 2023-07-07 ENCOUNTER — Other Ambulatory Visit (HOSPITAL_COMMUNITY): Payer: Self-pay

## 2023-07-11 ENCOUNTER — Other Ambulatory Visit (HOSPITAL_COMMUNITY): Payer: Self-pay

## 2023-07-19 ENCOUNTER — Other Ambulatory Visit: Payer: Self-pay

## 2023-07-19 ENCOUNTER — Other Ambulatory Visit (HOSPITAL_COMMUNITY): Payer: Self-pay

## 2023-07-20 ENCOUNTER — Other Ambulatory Visit: Payer: Self-pay | Admitting: Pharmacist

## 2023-07-20 ENCOUNTER — Encounter: Payer: Self-pay | Admitting: Pharmacist

## 2023-07-20 ENCOUNTER — Telehealth: Payer: Self-pay | Admitting: Pharmacist

## 2023-07-20 ENCOUNTER — Other Ambulatory Visit (HOSPITAL_COMMUNITY): Payer: Self-pay

## 2023-07-20 ENCOUNTER — Other Ambulatory Visit: Payer: Self-pay

## 2023-07-20 ENCOUNTER — Ambulatory Visit: Payer: Medicare Other | Admitting: Pharmacist

## 2023-07-20 DIAGNOSIS — Z79899 Other long term (current) drug therapy: Secondary | ICD-10-CM

## 2023-07-20 MED ORDER — HUMIRA (2 PEN) 40 MG/0.4ML ~~LOC~~ AJKT
AUTO-INJECTOR | SUBCUTANEOUS | 12 refills | Status: AC
  Filled 2023-12-26 – 2023-12-28 (×2): qty 2, 28d supply, fill #0
  Filled 2024-01-24: qty 2, 28d supply, fill #1
  Filled 2024-02-18: qty 2, 28d supply, fill #2
  Filled 2024-03-18: qty 2, 28d supply, fill #3
  Filled 2024-04-14: qty 2, 28d supply, fill #4
  Filled 2024-05-12: qty 0.8, 28d supply, fill #5
  Filled 2024-05-13: qty 2, 28d supply, fill #5
  Filled 2024-05-23 – 2024-06-09 (×2): qty 2, 28d supply, fill #6

## 2023-07-20 MED ORDER — HUMIRA (2 PEN) 40 MG/0.4ML ~~LOC~~ AJKT
AUTO-INJECTOR | SUBCUTANEOUS | 1 refills | Status: DC
  Filled 2023-07-20: qty 2, 28d supply, fill #0
  Filled 2023-08-07: qty 2, 28d supply, fill #1
  Filled 2023-09-07: qty 2, 28d supply, fill #2
  Filled 2023-10-03: qty 2, 28d supply, fill #3
  Filled 2023-11-01 (×2): qty 2, 28d supply, fill #4
  Filled 2023-11-28: qty 2, 28d supply, fill #5

## 2023-07-20 NOTE — Progress Notes (Signed)
 Specialty Pharmacy Initial Fill Coordination Note  Alexandra Lewis is a 68 y.o. female contacted today regarding initial fill of specialty medication(s) Adalimumab (Humira (2 Pen))   Patient requested Pickup at [facility] date: 07/20/23   Medication will be filled on 2/28.   Patient is aware of $0 copayment.

## 2023-07-20 NOTE — Progress Notes (Signed)
 Pharmacy Patient Advocate Encounter  Insurance verification completed.   The patient is insured through [employer] test claim for Humira. Currently a quantity of 2 is a 28 day supply and the co-pay is $0.  This test claim was processed through [facility]- copay amounts may vary at other pharmacies due to pharmacy/plan contracts, or as the patient moves through the different stages of their insurance plan.

## 2023-07-20 NOTE — Progress Notes (Signed)
 Specialty Pharmacy Initiation Note   Alexandra Lewis is a 68 y.o. female who will be followed by the specialty pharmacy service for RxSp Rheumatoid Arthritis    Review of administration, indication, effectiveness, safety, potential side effects, storage/disposable, and missed dose instructions occurred today for patient's specialty medication(s) Adalimumab (Humira (2 Pen))     Patient/Caregiver did not have any additional questions or concerns.   Patient's therapy is appropriate to: Initiate    Goals Addressed             This Visit's Progress    Minimize recurrence of flares       Patient is initiating therapy. Patient will maintain adherence         Otto Herb Specialty Pharmacist

## 2023-07-20 NOTE — Progress Notes (Signed)
 Opened in error.   Butch Penny, PharmD, Patsy Baltimore, CPP Clinical Pharmacist [facility] [phone]

## 2023-07-20 NOTE — Telephone Encounter (Signed)
 Called patient to schedule an appointment for the [facility] [employer] Specialty Medication [facility]. I was unable to reach the patient so I left a HIPAA-compliant message requesting that the patient return my call.   Butch Penny, PharmD, Patsy Baltimore, CPP Clinical Pharmacist [facility] [phone]

## 2023-07-23 NOTE — Progress Notes (Signed)
 Patient is BC[employer]. No CHEP visit needed at this time.   Butch Penny, PharmD, Patsy Baltimore, CPP Clinical Pharmacist [facility] [phone]

## 2023-07-24 ENCOUNTER — Ambulatory Visit: Payer: Medicare Other | Admitting: Internal Medicine

## 2023-07-31 ENCOUNTER — Ambulatory Visit: Payer: Medicare Other | Admitting: Cardiovascular Disease

## 2023-07-31 NOTE — Progress Notes (Deleted)
 Cardiology Office Note  Date:  07/31/2023   ID:  Alexandra Lewis, DOB Sep 22, 1955, MRN [id number]  PCP:  Sherlene Shams, MD   No chief complaint on file.   HPI:  Alexandra Lewis is a 68 year old woman with past medical history of Chronic pain, migraines, on narcotics Underweight RA on Enbrel Chronic abdominal pain Prior workup 2020 for fatigue, shortness of breath with normal echo Who presents by referral from Dr. Darrick Huntsman for shortness of breath on exertion    PMH:   has a past medical history of Adverse effect of salicylate- ulcer (03/26/2013), Anemia, Chronic back pain, Chronic Migraine Headaches, CVA (cerebrovascular accident) (HCC) (12/05/2011), Depression, Dysplastic nevus (06/2011), GERD (gastroesophageal reflux disease), History of dysplastic nevus (08/10/2011), History of peptic ulcer disease, adenomatous polyp of colon (02/08/2023), IBS (irritable bowel syndrome), Loss of weight, NSAID-induced gastric ulcer- pylorus (01/17/2013), Osteoporosis, Partial gastric outlet obstruction (07/04/2006), Postgastric surgery syndromes, Pyloric stenosis (11/25/2008), RA (rheumatoid arthritis) (HCC), Seizure (HCC) (2000), and Seizures (HCC).  PSH:    Past Surgical History:  Procedure Laterality Date   BREAST SURGERY Bilateral    fibrous mass removed   COLONOSCOPY  02/08/2023   1 mm adenoma   ESOPHAGOGASTRODUODENOSCOPY  04/13/2009   S/P EGD with dilitation of pylorus-Dr. Ewing Schlein   ESOPHAGOGASTRODUODENOSCOPY  11/25/2008   Status post EGD with dilatation Dr. Ewing Schlein   ESOPHAGOGASTRODUODENOSCOPY  07/04/2006   S/P EGD and Bx-Dr. Randa Evens   EXCISION MORTON'S NEUROMA  1983   Right foot   Pyloroplasty with Vagotomy  06/29/2009   S/P pyloroplasty with laparoscopic truncal vagotomy and hiatal hernia repair-07/19/2009 Dr. Daphine Deutscher   SEPTOPLASTY  1982   SKIN BIOPSY     TOTAL ABDOMINAL HYSTERECTOMY  2000   [facility]-Dr. DeFrancesco    Current Outpatient Medications  Medication Sig  Dispense Refill   Abatacept (ORENCIA CLICKJECT) 125 MG/ML SOAJ Inject 125 mg into the skin every 7 (seven) days. 12 mL 1   acetaminophen-codeine (TYLENOL #3) 300-30 MG tablet Take 1 tablet by mouth every 4 (four) hours as needed for moderate pain (pain score 4-6). 180 tablet 5   adalimumab (HUMIRA, 2 PEN,) 40 MG/0.4ML pen Inject 40mg  subcutaneously every 2 weeks as directed. 2 each 12   adalimumab (HUMIRA, 2 PEN,) 40 MG/0.4ML pen Inject 40 mg subcutaneously every 14 (fourteen) days 6 each 1   butalbital-aspirin-caffeine-codeine (FIORINAL/CODEINE #3) 50-325-40-30 MG capsule Take 1 capsule by mouth every 8 (eight) hours as needed for pain. 90 capsule 5   cyanocobalamin (VITAMIN B12) 1000 MCG/ML injection INJECT 1 ML (1,000 MCG TOTAL) INTO THE SKIN EVERY 14 (FOURTEEN) DAYS. 6 mL 5   diazepam (VALIUM) 2 MG tablet Take 1 tablet (2 mg total) by mouth every 12 (twelve) hours as needed. for anxiety 60 tablet 5   divalproex (DEPAKOTE) 125 MG DR tablet TAKE 1 TABLET BY MOUTH DAILY 90 tablet 2   ergocalciferol (VITAMIN D2) 1.25 MG (50000 UT) capsule Take 1 capsule (50,000 Units total) by mouth once a week. 12 capsule 5   Famotidine (PEPCID PO) Take 1 tablet by mouth daily with supper.     hydrocortisone (ANUSOL-HC) 2.5 % rectal cream Place 1 Application rectally 2 (two) times daily. After bowel movement and at at bedtime are optimal 30 g 1   INS SYRINGE/NEEDLE 1CC/28G (B-D INSULIN SYRINGE 1CC/28G) 28G X 1/2" 1 ML MISC For use with b12 injections 10 each 3   lidocaine-prilocaine (EMLA) cream APPLY 1 APPLICATION TO THE SKIN AS NEEDED 30 g 0   mirabegron  ER (MYRBETRIQ) 25 MG TB24 tablet Take 1 tablet (25 mg total) by mouth daily. 30 tablet 11   ondansetron (ZOFRAN) 4 MG tablet TAKE 1 TABLET BY MOUTH EVERY 8 HOURS AS NEEDED FOR NAUSEA AND/OR VOMITING 30 tablet 4   pantoprazole (PROTONIX) 40 MG tablet Take 1 tablet (40 mg total) by mouth daily. 90 tablet 1   SYRINGE-NEEDLE, DISP, 3 ML 25G X 1" 3 ML MISC Use to  inject B12. 6 each 0   triamcinolone cream (KENALOG) 0.1 % Apply 1 Application topically 2 (two) times daily. 30 g 0   TUBERCULIN SYR 1CC/27GX1/2" (B-D TB SYRINGE 1CC/27GX1/2") 27G X 1/2" 1 ML MISC Use to inject methotrexate 10 each 1   Upadacitinib ER (RINVOQ) 15 MG TB24 Take 1 tablet (15 mg total) by mouth once daily 90 tablet 1   Upadacitinib ER (RINVOQ) 15 MG TB24 Take 1 tablet (15 mg total) by mouth daily. 30 tablet 12   ZOLMitriptan (ZOMIG) 2.5 MG tablet Take 0.5-1 tablets (1.25-2.5 mg total) by mouth daily as needed for migraines. 8 tablet 5   No current facility-administered medications for this visit.     Allergies:   Methotrexate, Promethazine hcl, Sumatriptan, Celecoxib, Rizatriptan, Tape, and Upadacitinib   Social History:  The patient  reports that she has never smoked. She has never used smokeless tobacco. She reports that she does not drink alcohol and does not use drugs.   Family History:   family history includes Breast cancer in her mother; Diabetes in her mother; GER disease in her brother, father, sister, and sister; Heart disease in her paternal grandmother and paternal uncle; Hyperlipidemia in her brother; Hypertension in her brother and mother; Migraines in her sister; Uterine cancer in her mother.    Review of Systems: ROS   PHYSICAL EXAM: VS:  There were no vitals taken for this visit. , BMI There is no height or weight on file to calculate BMI. GEN: Well nourished, well developed, in no acute distress HEENT: normal Neck: no JVD, carotid bruits, or masses Cardiac: RRR; no murmurs, rubs, or gallops,no edema  Respiratory:  clear to auscultation bilaterally, normal work of breathing GI: soft, nontender, nondistended, + BS MS: no deformity or atrophy Skin: warm and dry, no rash Neuro:  Strength and sensation are intact Psych: euthymic mood, full affect    Recent Labs: 11/28/2022: ALT 14; BUN 22; Creatinine, Ser 1.26; Hemoglobin 12.4; Platelets 247.0; Potassium  4.7; Sodium 136    Lipid Panel Lab Results  Component Value Date   CHOL 248 (H) 04/25/2022   HDL 106.50 04/25/2022   LDLCALC 123 (H) 04/25/2022   TRIG 96.0 04/25/2022      Wt Readings from Last 3 Encounters:  04/13/23 94 lb 3.2 oz (42.7 kg)  04/10/23 94 lb (42.6 kg)  02/08/23 93 lb (42.2 kg)       ASSESSMENT AND PLAN:  Problem List Items Addressed This Visit   None    Disposition:   F/U  12 months   Total encounter time more than 30 minutes  Greater than 50% was spent in counseling and coordination of care with the patient    Signed, Dossie Arbour, M.D., Ph.D. [facility] [place], [place] [phone]

## 2023-08-07 ENCOUNTER — Other Ambulatory Visit (HOSPITAL_COMMUNITY): Payer: Self-pay

## 2023-08-07 ENCOUNTER — Other Ambulatory Visit: Payer: Self-pay

## 2023-08-07 NOTE — Progress Notes (Signed)
 Specialty Pharmacy Refill Coordination Note  Alexandra Lewis is a 68 y.o. female contacted today regarding refills of specialty medication(s) Adalimumab (Humira (2 Pen))   Patient requested Delivery   Delivery date: 08/14/23   Verified address: [address], [place], [place]   Medication will be filled on 08/13/23.

## 2023-08-08 ENCOUNTER — Other Ambulatory Visit: Payer: Self-pay | Admitting: Internal Medicine

## 2023-08-08 DIAGNOSIS — R531 Weakness: Secondary | ICD-10-CM | POA: Insufficient documentation

## 2023-08-08 DIAGNOSIS — M6281 Muscle weakness (generalized): Secondary | ICD-10-CM

## 2023-08-08 NOTE — Assessment & Plan Note (Signed)
 Symptoms present for several months. Has h/o left corona radiata stroke remotely.  Ordering MRI brain  and neurology follow up with Dr Sherryll Burger

## 2023-08-14 ENCOUNTER — Ambulatory Visit
Admission: RE | Admit: 2023-08-14 | Discharge: 2023-08-14 | Disposition: A | Discharge status: Home / Self Care | Source: Ambulatory Visit | Attending: Internal Medicine | Admitting: Internal Medicine

## 2023-08-14 DIAGNOSIS — M6281 Muscle weakness (generalized): Secondary | ICD-10-CM | POA: Diagnosis present

## 2023-08-14 MED ORDER — GADOBUTROL 1 MMOL/ML IV SOLN
4.0000 mL | Freq: Once | INTRAVENOUS | Status: AC | PRN
  Administered 2023-08-14: 4 mL via INTRAVENOUS

## 2023-08-20 ENCOUNTER — Encounter: Payer: Self-pay | Admitting: Internal Medicine

## 2023-08-20 NOTE — Telephone Encounter (Signed)
 Should have results by later this evening or early tomorrow.

## 2023-08-21 ENCOUNTER — Encounter: Payer: Self-pay | Admitting: Internal Medicine

## 2023-09-02 ENCOUNTER — Telehealth: Admitting: Family Medicine

## 2023-09-02 DIAGNOSIS — M545 Low back pain, unspecified: Secondary | ICD-10-CM

## 2023-09-02 MED ORDER — METHOCARBAMOL 500 MG PO TABS
500.0000 mg | ORAL_TABLET | Freq: Four times a day (QID) | ORAL | 0 refills | Status: DC
Start: 2023-09-02 — End: 2023-09-12

## 2023-09-02 NOTE — Progress Notes (Signed)
 E-Visit for Back Pain   We are sorry that you are not feeling well.  Here is how we plan to help!  Based on what you have shared with me it looks like you mostly have acute back pain.  Acute back pain is defined as musculoskeletal pain that can resolve in 1-3 weeks with conservative treatment.  I have prescribed  non-steroid anti-inflammatory (NSAID) as well as methocarbamol.  Some patients experience stomach irritation or in increased heartburn with anti-inflammatory drugs.  Please keep in mind that muscle relaxer's can cause fatigue and should not be taken while at work or driving.  Back pain is very common.  The pain often gets better over time.  The cause of back pain is usually not dangerous.  Most people can learn to manage their back pain on their own.  Home Care Stay active.  Start with short walks on flat ground if you can.  Try to walk farther each day. Do not sit, drive or stand in one place for more than 30 minutes.  Do not stay in bed. Do not avoid exercise or work.  Activity can help your back heal faster. Be careful when you bend or lift an object.  Bend at your knees, keep the object close to you, and do not twist. Sleep on a firm mattress.  Lie on your side, and bend your knees.  If you lie on your back, put a pillow under your knees. Only take medicines as told by your doctor. Put ice on the injured area. Put ice in a plastic bag Place a towel between your skin and the bag Leave the ice on for 15-20 minutes, 3-4 times a day for the first 2-3 days. 210 After that, you can switch between ice and heat packs. Ask your doctor about back exercises or massage. Avoid feeling anxious or stressed.  Find good ways to deal with stress, such as exercise.  Get Help Right Way If: Your pain does not go away with rest or medicine. Your pain does not go away in 1 week. You have new problems. You do not feel well. The pain spreads into your legs. You cannot control when you poop (bowel  movement) or pee (urinate) You feel sick to your stomach (nauseous) or throw up (vomit) You have belly (abdominal) pain. You feel like you may pass out (faint). If you develop a fever.  Make Sure you: Understand these instructions. Will watch your condition Will get help right away if you are not doing well or get worse.  Your e-visit answers were reviewed by a board certified advanced clinical practitioner to complete your personal care plan.  Depending on the condition, your plan could have included both over the counter or prescription medications.  If there is a problem please reply  once you have received a response from your provider.  Your safety is important to us .  If you have drug allergies check your prescription carefully.    You can use MyChart to ask questions about today's visit, request a non-urgent call back, or ask for a work or school excuse for 24 hours related to this e-Visit. If it has been greater than 24 hours you will need to follow up with your provider, or enter a new e-Visit to address those concerns.  You will get an e-mail in the next two days asking about your experience.  I hope that your e-visit has been valuable and will speed your recovery. Thank you for using e-visits.  have provided 5 minutes of non face to face time during this encounter for chart review and documentation.

## 2023-09-04 ENCOUNTER — Other Ambulatory Visit: Payer: Self-pay

## 2023-09-05 ENCOUNTER — Other Ambulatory Visit (HOSPITAL_COMMUNITY): Payer: Self-pay

## 2023-09-05 ENCOUNTER — Other Ambulatory Visit: Payer: Self-pay

## 2023-09-05 ENCOUNTER — Ambulatory Visit (INDEPENDENT_AMBULATORY_CARE_PROVIDER_SITE_OTHER): Admitting: Internal Medicine

## 2023-09-05 ENCOUNTER — Encounter: Payer: Self-pay | Admitting: Internal Medicine

## 2023-09-05 VITALS — BP 106/62 | HR 76 | Ht 62.6 in | Wt 95.2 lb

## 2023-09-05 DIAGNOSIS — E785 Hyperlipidemia, unspecified: Secondary | ICD-10-CM

## 2023-09-05 DIAGNOSIS — T39095S Adverse effect of salicylates, sequela: Secondary | ICD-10-CM | POA: Diagnosis not present

## 2023-09-05 DIAGNOSIS — F1120 Opioid dependence, uncomplicated: Secondary | ICD-10-CM

## 2023-09-05 DIAGNOSIS — Z79899 Other long term (current) drug therapy: Secondary | ICD-10-CM

## 2023-09-05 DIAGNOSIS — N1831 Chronic kidney disease, stage 3a: Secondary | ICD-10-CM

## 2023-09-05 DIAGNOSIS — M542 Cervicalgia: Secondary | ICD-10-CM

## 2023-09-05 DIAGNOSIS — R7301 Impaired fasting glucose: Secondary | ICD-10-CM

## 2023-09-05 DIAGNOSIS — R531 Weakness: Secondary | ICD-10-CM

## 2023-09-05 DIAGNOSIS — M816 Localized osteoporosis [Lequesne]: Secondary | ICD-10-CM

## 2023-09-05 DIAGNOSIS — Z8673 Personal history of transient ischemic attack (TIA), and cerebral infarction without residual deficits: Secondary | ICD-10-CM

## 2023-09-05 DIAGNOSIS — Z1231 Encounter for screening mammogram for malignant neoplasm of breast: Secondary | ICD-10-CM

## 2023-09-05 DIAGNOSIS — Z0000 Encounter for general adult medical examination without abnormal findings: Secondary | ICD-10-CM | POA: Diagnosis not present

## 2023-09-05 MED ORDER — BUTALBITAL-ASA-CAFF-CODEINE 50-325-40-30 MG PO CAPS: 1.0000 | ORAL_CAPSULE | Freq: Three times a day (TID) | ORAL | 5 refills | Status: DC | PRN

## 2023-09-05 MED ORDER — CALCITONIN (SALMON) 200 UNIT/ACT NA SOLN
1.0000 | Freq: Every day | NASAL | 12 refills | Status: DC
  Filled 2023-09-05: qty 3.7, 30d supply, fill #0
  Filled 2023-10-06: qty 3.7, 30d supply, fill #1

## 2023-09-05 MED ORDER — PANTOPRAZOLE SODIUM 40 MG PO TBEC
40.0000 mg | DELAYED_RELEASE_TABLET | Freq: Every day | ORAL | 1 refills | Status: DC
  Filled 2023-09-05 – 2023-10-09 (×2): qty 90, 90d supply, fill #0
  Filled 2024-01-07: qty 90, 90d supply, fill #1

## 2023-09-05 MED ORDER — DIAZEPAM 2 MG PO TABS
2.0000 mg | ORAL_TABLET | Freq: Two times a day (BID) | ORAL | 5 refills | Status: AC | PRN
Start: 2023-09-05 — End: ?
  Filled 2023-09-05: qty 60, 30d supply, fill #0

## 2023-09-05 MED ORDER — METHOCARBAMOL 500 MG PO TABS
500.0000 mg | ORAL_TABLET | Freq: Four times a day (QID) | ORAL | 0 refills | Status: DC
  Filled 2023-09-05 – 2023-09-24 (×2): qty 40, 10d supply, fill #0

## 2023-09-05 MED ORDER — ACETAMINOPHEN-CODEINE 300-30 MG PO TABS: 1.0000 | ORAL_TABLET | ORAL | 5 refills | Status: DC | PRN

## 2023-09-05 NOTE — Assessment & Plan Note (Addendum)
 Currently untreated.  Treatment options limited given recent intolerance to Evenity, alendronate .  Evista contraindicated. Willing to retry miacalcin

## 2023-09-05 NOTE — Progress Notes (Unsigned)
 Patient ID: Alexandra Lewis, female    DOB: 03/05/1956  Age: 68 y.o. MRN: [id number]  The patient is here for annual preventive examination and management of other chronic and acute problems.   The risk factors are reflected in the social history.   The roster of all physicians providing medical care to patient - is listed in the Snapshot section of the chart.   Activities of daily living:  The patient is 100% independent in all ADLs: dressing, toileting, feeding as well as independent mobility   Home safety : The patient has smoke detectors in the home. They wear seatbelts.  There are no unsecured firearms at home. There is no violence in the home.    There is no risks for hepatitis, STDs or HIV. There is no   history of blood transfusion. They have no travel history to infectious disease endemic areas of the world.   The patient has seen their dentist in the last six month. They have seen their eye doctor in the last year. The patinet  denies slight hearing difficulty with regard to whispered voices and some television programs.  They have deferred audiologic testing in the last year.  They do not  have excessive sun exposure. Discussed the need for sun protection: hats, long sleeves and use of sunscreen if there is significant sun exposure.    Diet: the importance of a healthy diet is discussed. They do have a healthy diet.   The benefits of regular aerobic exercise were discussed. The patient  exercises  3 to 5 days per week  for  60 minutes.    Depression screen: there are no signs or vegative symptoms of depression- irritability, change in appetite, anhedonia, sadness/tearfullness.   The following portions of the patient's history were reviewed and updated as appropriate: allergies, current medications, past family history, past medical history,  past surgical history, past social history  and problem list.   Visual acuity was not assessed per patient preference since the patient has  regular follow up with an  ophthalmologist. Hearing and body mass index were assessed and reviewed.    During the course of the visit the patient was educated and counseled about appropriate screening and preventive services including : fall prevention , diabetes screening, nutrition counseling, colorectal cancer screening, and recommended immunizations.    Chief Complaint:  E visit April 13 for acut e back pain : PRESCRIBED ROBAXIN  MRI brain done in March  [facility] ACUTE ONSET OF LEFT SIDED WEAKNESS:  NO  new CVA noted.  Has appt with Sherryll Burger in June bc left side still feels weird and left foot is dropping.  PT offered by GYN but she deferred . She is using a CUBIO at home and plans to start walking her dog when the weather is war   RA: started taking Orencia  after Enbrel stopped working  Renbo caused gastritis.  Now on Humira 4 doses thus far (every 2 weeks)  Still in pain. Marland Kitchenseeing Sherryll Burger , neurology .  Has chronic muscle pian on the left side  left foot drop,  foot not clearing the floor     Review of Symptoms  Patient denies headache, fevers, malaise, unintentional weight loss, skin rash, eye pain, sinus congestion and sinus pain, sore throat, dysphagia,  hemoptysis , cough, dyspnea, wheezing, chest pain, palpitations, orthopnea, edema, abdominal pain, nausea, melena, diarrhea, constipation, flank pain, dysuria, hematuria, urinary  Frequency, nocturia, numbness, tingling, seizures,  Focal weakness, Loss of consciousness,  Tremor, insomnia, depression, anxiety, and suicidal ideation.    Physical Exam:  BP 106/62   Pulse 76   Ht 5' 2.6" (1.59 m)   Wt 95 lb 3.2 oz (43.2 kg)   SpO2 95%   BMI 17.08 kg/m    Physical Exam  Assessment and Plan: Long-term use of high-risk medication  Adverse effect of salicylate, sequela  Hyperlipidemia, unspecified hyperlipidemia type  Impaired fasting glucose    No follow-ups on file.  Thersia Flax, MD

## 2023-09-06 ENCOUNTER — Encounter: Payer: Self-pay | Admitting: Internal Medicine

## 2023-09-06 ENCOUNTER — Other Ambulatory Visit: Payer: Self-pay

## 2023-09-06 ENCOUNTER — Other Ambulatory Visit (HOSPITAL_COMMUNITY): Payer: Self-pay

## 2023-09-06 DIAGNOSIS — N1831 Chronic kidney disease, stage 3a: Secondary | ICD-10-CM | POA: Insufficient documentation

## 2023-09-06 DIAGNOSIS — Z0000 Encounter for general adult medical examination without abnormal findings: Secondary | ICD-10-CM | POA: Insufficient documentation

## 2023-09-06 LAB — LIPID PANEL
Cholesterol: 201 mg/dL — ABNORMAL HIGH (ref 0–200)
HDL: 102.9 mg/dL (ref >39.00)
LDL Cholesterol: 84 mg/dL (ref 0–99)
NonHDL: 98.16
Total CHOL/HDL Ratio: 2
Triglycerides: 72 mg/dL (ref 0.0–149.0)
VLDL: 14.4 mg/dL (ref 0.0–40.0)

## 2023-09-06 LAB — CBC WITH DIFFERENTIAL/PLATELET
Basophils Absolute: 0 10*3/uL (ref 0.0–0.1)
Basophils Relative: 0.4 % (ref 0.0–3.0)
Eosinophils Absolute: 0.1 10*3/uL (ref 0.0–0.7)
Eosinophils Relative: 1 % (ref 0.0–5.0)
HCT: 37.3 % (ref 36.0–46.0)
Hemoglobin: 12.2 g/dL (ref 12.0–15.0)
Lymphocytes Relative: 24.4 % (ref 12.0–46.0)
Lymphs Abs: 1.4 10*3/uL (ref 0.7–4.0)
MCHC: 32.8 g/dL (ref 30.0–36.0)
MCV: 91.6 fl (ref 78.0–100.0)
Monocytes Absolute: 0.5 10*3/uL (ref 0.1–1.0)
Monocytes Relative: 9.5 % (ref 3.0–12.0)
Neutro Abs: 3.6 10*3/uL (ref 1.4–7.7)
Neutrophils Relative %: 64.7 % (ref 43.0–77.0)
Platelets: 234 10*3/uL (ref 150.0–400.0)
RBC: 4.07 Mil/uL (ref 3.87–5.11)
RDW: 13.2 % (ref 11.5–15.5)
WBC: 5.6 10*3/uL (ref 4.0–10.5)

## 2023-09-06 LAB — COMPREHENSIVE METABOLIC PANEL WITH GFR
ALT: 13 U/L (ref 0–35)
AST: 16 U/L (ref 0–37)
Albumin: 4.4 g/dL (ref 3.5–5.2)
Alkaline Phosphatase: 81 U/L (ref 39–117)
BUN: 21 mg/dL (ref 6–23)
CO2: 28 meq/L (ref 19–32)
Calcium: 8.9 mg/dL (ref 8.4–10.5)
Chloride: 97 meq/L (ref 96–112)
Creatinine, Ser: 1.22 mg/dL — ABNORMAL HIGH (ref 0.40–1.20)
GFR: 46.1 mL/min — ABNORMAL LOW (ref >60.00)
Glucose, Bld: 74 mg/dL (ref 70–99)
Potassium: 4.5 meq/L (ref 3.5–5.1)
Sodium: 132 meq/L — ABNORMAL LOW (ref 135–145)
Total Bilirubin: 0.3 mg/dL (ref 0.2–1.2)
Total Protein: 6.2 g/dL (ref 6.0–8.3)

## 2023-09-06 LAB — LDL CHOLESTEROL, DIRECT: Direct LDL: 79 mg/dL

## 2023-09-06 LAB — HEMOGLOBIN A1C: Hgb A1c MFr Bld: 5.9 % (ref 4.6–6.5)

## 2023-09-06 LAB — TSH: TSH: 2.75 u[IU]/mL (ref 0.35–5.50)

## 2023-09-06 NOTE — Assessment & Plan Note (Signed)
 Left posterior corona radiata 2013.   Recent onset of left leg weakness reported;  MRI brain was ordered to rule out recurrent CVA

## 2023-09-06 NOTE — Assessment & Plan Note (Signed)
 She has had multiple treatment failures due to drug intolerances.  Her RA is currently managed with weekly Orencia , prescribed by [facility]

## 2023-09-06 NOTE — Assessment & Plan Note (Signed)

## 2023-09-06 NOTE — Assessment & Plan Note (Signed)
 Stable,  follow up with [facility]   Lab Results  Component Value Date   CREATININE 1.22 (H) 09/05/2023

## 2023-09-06 NOTE — Assessment & Plan Note (Signed)
She has arthritic pain and frequent headaches.  Managed with Tylenol #3 and Fioroinal with codeine  due to history of gastric ulcerwhich was NSAID induced.  Refill history confirmed via [place] Controlled Substance databas, accessed by me today.Marland Kitchen

## 2023-09-06 NOTE — Assessment & Plan Note (Signed)
 Symptoms present for several months. Has h/o left corona radiata stroke remotely.  Repeat MRI brain  showed no acute CVA , has follow up with Dr Mason Sole

## 2023-09-07 ENCOUNTER — Other Ambulatory Visit: Payer: Self-pay

## 2023-09-07 NOTE — Progress Notes (Signed)
 Specialty Pharmacy Refill Coordination Note  Alexandra Lewis is a 68 y.o. female contacted today regarding refills of specialty medication(s) Adalimumab  (Humira  (2 Pen))   Patient requested Delivery   Delivery date: 09/11/23   Verified address: [address]   [place] [address]   Medication will be filled on 09/10/23.

## 2023-09-10 ENCOUNTER — Other Ambulatory Visit: Payer: Self-pay

## 2023-09-24 ENCOUNTER — Other Ambulatory Visit (HOSPITAL_COMMUNITY): Payer: Self-pay

## 2023-09-27 ENCOUNTER — Ambulatory Visit: Payer: Self-pay

## 2023-09-27 NOTE — Telephone Encounter (Signed)
 Called to follow up with pt's husband to see if he was going to take pt to the ED. He stated that he was going to give pt a little longer to get woke up and moved around good then discuss with pt what she felt like she needed to do. Husband stated that last night pt's SOBr, fatigue and chest tightness was so bad that pt was about to go to the ED then but then decided not too. Husband's concern is not knowing if these symptoms are coming from her rheumatoid arthritis or actually something more serious that she does need to go to the ED for. He would like to know what Dr. Tullo's recommendation would be or if she would be able to order some testing. I let pt's husband know that I would run this by Dr. Madelon Scheuermann but that she will be out of the office until next Tuesday. Dr. Madelon Scheuermann heard our conversation and verbally told me that pt needs to be evaluated at the ED. Called pt's husband and advised the ED again per Dr. Berl Breed recommendation and he stated "I will work on getting her there".

## 2023-09-27 NOTE — Telephone Encounter (Signed)
Pts husband is aware.  °

## 2023-09-27 NOTE — Telephone Encounter (Signed)
  Chief Complaint: chest pain Symptoms: fatigue, SOB Frequency: 1 week Pertinent Negatives: Patient denies dizziness N/V Disposition: [x] ED /[] Urgent Care (no appt availability in office) / [] Appointment(In office/virtual)/ []  [facility]/ [] Home Care/ [] Refused Recommended Disposition /[]  Mobile Bus/ []  Follow-up with PCP Additional Notes: Worsening sx  Copied from CRM [id number]. Topic: Clinical - Red Word Triage >> Sep 27, 2023  9:26 AM Albertha Alosa wrote: [facility] that prompted transfer to Nurse Triage: Patient husband called in stating patient has been having shortness of breath and tightness in chest. Reason for Disposition  Difficulty breathing  Answer Assessment - Initial Assessment Questions 1. LOCATION: "Where does it hurt?"       Center chest  2. RADIATION: "Does the pain go anywhere else?" (e.g., into neck, jaw, arms, back)     ? Back  3. ONSET: "When did the chest pain begin?" (Minutes, hours or days)      1 weeks  4. PATTERN: "Does the pain come and go, or has it been constant since it started?"  "Does it get worse with exertion?"      Comes and goes   6. SEVERITY: "How bad is the pain?"  (e.g., Scale 1-10; mild, moderate, or severe)    - MILD (1-3): doesn't interfere with normal activities     - MODERATE (4-7): interferes with normal activities or awakens from sleep    - SEVERE (8-10): excruciating pain, unable to do any normal activities       moderate 7. CARDIAC RISK FACTORS: "Do you have any history of heart problems or risk factors for heart disease?" (e.g., angina, prior heart attack; diabetes, high blood pressure, high cholesterol, smoker, or strong family history of heart disease)     *No Answer* 8. PULMONARY RISK FACTORS: "Do you have any history of lung disease?"  (e.g., blood clots in lung, asthma, emphysema, birth control pills)     *No Answer* 9. CAUSE: "What do you think is causing the chest pain?"     *No Answer* 10. OTHER SYMPTOMS: "Do  you have any other symptoms?" (e.g., dizziness, nausea, vomiting, sweating, fever, difficulty breathing, cough)       SOB, fatigue, worsening  11. PREGNANCY: "Is there any chance you are pregnant?" "When was your last menstrual period?"       *No Answer*  Protocols used: Chest Pain-A-AH

## 2023-10-01 ENCOUNTER — Other Ambulatory Visit: Payer: Self-pay

## 2023-10-03 ENCOUNTER — Other Ambulatory Visit: Payer: Self-pay

## 2023-10-03 NOTE — Progress Notes (Signed)
 Specialty Pharmacy Refill Coordination Note  Alexandra Lewis is a 68 y.o. female contacted today regarding refills of specialty medication(s) Adalimumab  (Humira  (2 Pen))   Patient requested Delivery   Delivery date: 10/09/23   Verified address: [address]   [place] [address]   Medication will be filled on 10/08/23.

## 2023-10-08 ENCOUNTER — Other Ambulatory Visit: Payer: Self-pay

## 2023-10-08 ENCOUNTER — Other Ambulatory Visit (HOSPITAL_COMMUNITY): Payer: Self-pay

## 2023-10-09 ENCOUNTER — Other Ambulatory Visit (HOSPITAL_COMMUNITY): Payer: Self-pay

## 2023-10-09 ENCOUNTER — Other Ambulatory Visit: Payer: Self-pay

## 2023-10-17 ENCOUNTER — Other Ambulatory Visit: Payer: Self-pay

## 2023-10-19 ENCOUNTER — Telehealth: Admitting: Physician Assistant

## 2023-10-19 DIAGNOSIS — R3989 Other symptoms and signs involving the genitourinary system: Secondary | ICD-10-CM

## 2023-10-19 MED ORDER — CEPHALEXIN 500 MG PO CAPS: 500.0000 mg | ORAL_CAPSULE | Freq: Two times a day (BID) | ORAL | 0 refills | Status: DC

## 2023-10-19 NOTE — Progress Notes (Signed)

## 2023-10-22 ENCOUNTER — Other Ambulatory Visit: Payer: Self-pay

## 2023-10-22 ENCOUNTER — Other Ambulatory Visit (HOSPITAL_COMMUNITY): Payer: Self-pay

## 2023-10-23 ENCOUNTER — Other Ambulatory Visit (HOSPITAL_COMMUNITY): Payer: Self-pay

## 2023-10-23 ENCOUNTER — Other Ambulatory Visit: Payer: Self-pay | Admitting: Internal Medicine

## 2023-10-23 DIAGNOSIS — M542 Cervicalgia: Secondary | ICD-10-CM

## 2023-10-24 ENCOUNTER — Other Ambulatory Visit: Payer: Self-pay

## 2023-10-24 ENCOUNTER — Other Ambulatory Visit (HOSPITAL_COMMUNITY): Payer: Self-pay

## 2023-10-24 MED ORDER — METHOCARBAMOL 500 MG PO TABS
500.0000 mg | ORAL_TABLET | Freq: Four times a day (QID) | ORAL | 0 refills | Status: DC
  Filled 2023-10-24: qty 40, 10d supply, fill #0

## 2023-11-01 ENCOUNTER — Other Ambulatory Visit: Payer: Self-pay

## 2023-11-01 ENCOUNTER — Encounter (INDEPENDENT_AMBULATORY_CARE_PROVIDER_SITE_OTHER): Payer: Self-pay

## 2023-11-01 NOTE — Progress Notes (Signed)
 Specialty Pharmacy Refill Coordination Note  Alexandra Lewis is a 68 y.o. female contacted today regarding refills of specialty medication(s) Adalimumab  (Humira  (2 Pen))   Patient requested Delivery   Delivery date: 11/06/23   Verified address: [address] [place] [address]   Medication will be filled on 11/05/23.

## 2023-11-02 ENCOUNTER — Telehealth: Payer: Self-pay

## 2023-11-02 NOTE — Telephone Encounter (Signed)
 Copied from CRM [id number]. Topic: Clinical - Medication Question >> Nov 02, 2023  9:55 AM [name] wrote: Reason for CRM: Alexandra Lewis / [employer] is calling regarding a denial for ZOLMitriptan  (ZOMIG ) 2.5 MG tablet. She states provider notes were not received. They will be faxing the denial information to the office.

## 2023-11-02 NOTE — Telephone Encounter (Signed)
 PA for Zomig  is needed

## 2023-11-06 ENCOUNTER — Other Ambulatory Visit (HOSPITAL_COMMUNITY): Payer: Self-pay

## 2023-11-07 ENCOUNTER — Other Ambulatory Visit: Payer: Self-pay | Admitting: Neurology

## 2023-11-07 DIAGNOSIS — R29898 Other symptoms and signs involving the musculoskeletal system: Secondary | ICD-10-CM

## 2023-11-08 ENCOUNTER — Other Ambulatory Visit: Payer: Self-pay

## 2023-11-08 ENCOUNTER — Telehealth: Payer: Self-pay

## 2023-11-08 NOTE — Progress Notes (Signed)
 Clinical Intervention Note  Clinical Intervention Notes: Patient reports initiating Sinemet for Parkinson's Disease. No DDIs identified with Humira .   Clinical Intervention Outcomes: Prevention of an adverse drug event   Rena Carnes Specialty Pharmacist

## 2023-11-08 NOTE — Telephone Encounter (Signed)
 Left message with the spouse to call back to see if she is interested in a mammogram and if so where would she like to have it done so we can schedule it.

## 2023-11-09 ENCOUNTER — Ambulatory Visit: Admitting: Cardiovascular Disease

## 2023-11-12 ENCOUNTER — Telehealth: Admitting: Physician Assistant

## 2023-11-12 DIAGNOSIS — B9689 Other specified bacterial agents as the cause of diseases classified elsewhere: Secondary | ICD-10-CM | POA: Diagnosis not present

## 2023-11-12 DIAGNOSIS — J208 Acute bronchitis due to other specified organisms: Secondary | ICD-10-CM

## 2023-11-12 MED ORDER — DOXYCYCLINE HYCLATE 100 MG PO TABS: 100.0000 mg | ORAL_TABLET | Freq: Two times a day (BID) | ORAL | 0 refills | Status: DC

## 2023-11-12 NOTE — Progress Notes (Signed)
 E-Visit for Cough   We are sorry that you are not feeling well.  Here is how we plan to help!  Based on your presentation I believe you most likely have A cough due to bacteria.  When patients have a fever and a productive cough with a change in color or increased sputum production, we are concerned about bacterial bronchitis.  If left untreated it can progress to pneumonia.  If your symptoms do not improve with your treatment plan it is important that you contact your provider.   I have prescribed Doxycycline 100 mg twice a day for 7 days     In addition you may use A non-prescription cough medication called Mucinex DM: take 2 tablets every 12 hours.  From your responses in the eVisit questionnaire you describe inflammation in the upper respiratory tract which is causing a significant cough.  This is commonly called Bronchitis and has four common causes:   Allergies Viral Infections Acid Reflux Bacterial Infection Allergies, viruses and acid reflux are treated by controlling symptoms or eliminating the cause. An example might be a cough caused by taking certain blood pressure medications. You stop the cough by changing the medication. Another example might be a cough caused by acid reflux. Controlling the reflux helps control the cough.    HOME CARE Only take medications as instructed by your medical team. Complete the entire course of an antibiotic. Drink plenty of fluids and get plenty of rest. Avoid close contacts especially the very young and the elderly Cover your mouth if you cough or cough into your sleeve. Always remember to wash your hands A steam or ultrasonic humidifier can help congestion.   GET HELP RIGHT AWAY IF: You develop worsening fever. You become short of breath You cough up blood. Your symptoms persist after you have completed your treatment plan MAKE SURE YOU  Understand these instructions. Will watch your condition. Will get help right away if you are not  doing well or get worse.    Thank you for choosing an e-visit.  Your e-visit answers were reviewed by a board certified advanced clinical practitioner to complete your personal care plan. Depending upon the condition, your plan could have included both over the counter or prescription medications.  Please review your pharmacy choice. Make sure the pharmacy is open so you can pick up prescription now. If there is a problem, you may contact your provider through [employer] and have the prescription routed to another pharmacy.  Your safety is important to us . If you have drug allergies check your prescription carefully.   For the next 24 hours you can use MyChart to ask questions about today's visit, request a non-urgent call back, or ask for a work or school excuse. You will get an email in the next two days asking about your experience. I hope that your e-visit has been valuable and will speed your recovery.     I have spent 5 minutes in review of e-visit questionnaire, review and updating patient chart, medical decision making and response to patient.   Delon CHRISTELLA Dickinson, PA-C

## 2023-11-15 ENCOUNTER — Ambulatory Visit: Admission: RE | Admit: 2023-11-15 | Source: Ambulatory Visit

## 2023-11-16 ENCOUNTER — Encounter: Payer: Self-pay | Admitting: Internal Medicine

## 2023-11-19 ENCOUNTER — Other Ambulatory Visit (HOSPITAL_COMMUNITY): Payer: Self-pay

## 2023-11-19 NOTE — Telephone Encounter (Signed)
 PA for Zomig  is needed

## 2023-11-20 ENCOUNTER — Ambulatory Visit

## 2023-11-20 NOTE — Telephone Encounter (Signed)
 Copied from CRM [id number]. Topic: Clinical - Medication Question >> Nov 20, 2023  9:25 AM Martinique E wrote: Reason for CRM: Cathlyn from [facility] called in regarding the patient's appeal for ZOLMitriptan  (ZOMIG ) 2.5 MG tablet. Cathlyn is questioning if patient has tried or failed the medication Naratriptan. Callback number for Cathlyn is [phone].

## 2023-11-21 ENCOUNTER — Ambulatory Visit (INDEPENDENT_AMBULATORY_CARE_PROVIDER_SITE_OTHER)

## 2023-11-21 ENCOUNTER — Ambulatory Visit
Admission: RE | Admit: 2023-11-21 | Discharge: 2023-11-21 | Disposition: A | Discharge status: Home / Self Care | Source: Ambulatory Visit

## 2023-11-21 VITALS — BP 118/77 | HR 83 | Temp 98.1°F | Resp 18

## 2023-11-21 DIAGNOSIS — R051 Acute cough: Secondary | ICD-10-CM

## 2023-11-21 DIAGNOSIS — J069 Acute upper respiratory infection, unspecified: Secondary | ICD-10-CM

## 2023-11-21 DIAGNOSIS — R0602 Shortness of breath: Secondary | ICD-10-CM

## 2023-11-21 MED ORDER — DOXYCYCLINE HYCLATE 100 MG PO CAPS: 100.0000 mg | ORAL_CAPSULE | Freq: Two times a day (BID) | ORAL | 0 refills | Status: AC

## 2023-11-21 NOTE — ED Triage Notes (Signed)
 Cough, congestion x 1.5 weeks. Did a Evisit on 6/23 and got prescribed doxycyline and took it but still having symptoms.

## 2023-11-21 NOTE — Discharge Instructions (Addendum)
 Follow up with your primary care provider tomorrow.  Go to the emergency department if you have worsening symptoms.    Take an additional 7 days of doxycycline  as prescribed.

## 2023-11-21 NOTE — ED Provider Notes (Signed)
 [facility]    CSN: [id number] Arrival date & time: 11/21/23  1457      History   Chief Complaint Chief Complaint  Patient presents with   Cough    Productive cough - Entered by patient    HPI Alexandra Lewis is a 68 y.o. female.  Accompanied by her husband, patient presents with a 10-day history of chest congestion and cough.  Her cough is productive of clear sputum.  She reports mild shortness of breath with exertion.  She recently took a 7-day course of doxycycline  which was helping but once she finished the antibiotic, her symptoms worsened again.  Worse x 3 days.  She denies fever, chills, chest pain.  Patient had a telehealth visit on 11/12/2023; diagnosed with acute bacterial bronchitis; treated with doxycycline .    The history is provided by the spouse, the patient and medical records.    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   Chronic back pain    Chronic Migraine Headaches    CVA (cerebrovascular accident) (HCC) 12/05/2011   Left CVA(corona radiata); Right sided weakness   Depression    Dysplastic nevus 06/2011   Left abdomen s/p excision 07/2011   GERD (gastroesophageal reflux disease)    History of dysplastic nevus 08/10/2011   left abdomen, severe atypia/excision   History of peptic ulcer disease    Hx of adenomatous polyp of colon 02/08/2023   1 mm adenoma   IBS (irritable bowel syndrome)    Loss of weight    NSAID-induced gastric ulcer- pylorus 01/17/2013   Osteoporosis    T6 compression fracture   Partial gastric outlet obstruction 07/04/2006   Postgastric surgery syndromes    Pyloric stenosis 11/25/2008   S/P Pyloroplasty- 06/29/09 - Dr. Gladis   RA (rheumatoid arthritis) [facility])    Seizure (HCC) 2000   Seizures (HCC)     Patient Active Problem List   Diagnosis Date Noted   Encounter for preventive health examination  09/06/2023   CKD stage 3a, GFR 45-59 ml/min (HCC) 09/06/2023   Left-sided weakness 08/08/2023   Levator spasm 04/13/2023   Overactive bladder 04/13/2023   Hx of pyloroplasty 03/07/2022   SOB (shortness of breath) 03/07/2022   Night terrors, adult 05/01/2021   Myalgia due to statin 04/29/2021   Chronic pain disorder 11/07/2020   Rheumatoid arthritis (HCC) 08/19/2019   Narcotic dependency, continuous (HCC) 08/19/2019   Wrist pain, chronic, right 01/07/2019   History of lacunar cerebrovascular accident 03/28/2018   Chronic fatigue 03/28/2018   Long-term use of high-risk medication 12/30/2015   Adverse effect of salicylate- ulcer 03/26/2013   NSAID-induced gastric ulcer- pylorus 01/17/2013   Dysplastic nevus 06/2011   Episodic mood disorder (HCC) 06/22/2010   Migraine headache 06/22/2010   Irritable bowel syndrome 06/22/2010   POSTGASTRIC SURGERY SYNDROMES 06/22/2010   Osteoporosis 06/22/2010   WEIGHT LOSS-ABNORMAL 06/22/2010   NAUSEA AND VOMITING 06/22/2010   PERSONAL HISTORY OF PEPTIC ULCER DISEASE 06/22/2010    Past Surgical History:  Procedure Laterality Date   BREAST SURGERY Bilateral    fibrous mass removed   COLONOSCOPY  02/08/2023   1 mm adenoma   ESOPHAGOGASTRODUODENOSCOPY  04/13/2009   S/P EGD with dilitation of pylorus-Dr. Rosalie   ESOPHAGOGASTRODUODENOSCOPY  11/25/2008   Status post EGD with dilatation Dr. Rosalie   ESOPHAGOGASTRODUODENOSCOPY  07/04/2006   S/P  EGD and Bx-Dr. Celestia   EXCISION [facility]  [phone]   Right foot   Pyloroplasty with Vagotomy  06/29/2009   S/P pyloroplasty with laparoscopic truncal vagotomy and hiatal hernia repair-07/19/2009 Dr. Gladis   SEPTOPLASTY  1982   SKIN BIOPSY     TOTAL ABDOMINAL HYSTERECTOMY  2000   [facility]-Dr. DeFrancesco   TUBAL LIGATION      OB History     Gravida  2   Para      Term      Preterm      AB  2   Living         SAB  2   IAB      Ectopic      Multiple      Live Births                Home Medications    Prior to Admission medications   Medication Sig Start Date End Date Taking? Authorizing Provider  adalimumab  (HUMIRA , 2 PEN,) 40 MG/0.4ML pen Inject 40mg  subcutaneously every 2 weeks as directed. 07/06/23  Yes Patel, Mayur K, MD  butalbital -aspirin -caffeine -codeine  (FIORINAL/CODEINE  #3) 50-325-40-30 MG capsule Take 1 capsule by mouth every 8 (eight) hours as needed for pain. 09/05/23  Yes Tullo, Teresa L, MD  calcitonin, salmon, (MIACALCIN MARCIANA) 200 UNIT/ACT nasal spray Place 1 spray into alternate nostrils daily. 09/05/23  Yes Marylynn Verneita CROME, MD  carbidopa-levodopa (SINEMET IR) 25-100 MG tablet Take 1 tablet by mouth 3 (three) times daily. 10/31/23 11/30/24 Yes [provider]  cyanocobalamin  (VITAMIN B12) 1000 MCG/ML injection INJECT 1 ML (1,000 MCG TOTAL) INTO THE SKIN EVERY 14 (FOURTEEN) DAYS. 12/04/22 01/04/24 Yes Marylynn Verneita CROME, MD  divalproex  (DEPAKOTE ) 125 MG DR tablet TAKE 1 TABLET BY MOUTH DAILY 04/26/23  Yes Tullo, Teresa L, MD  doxycycline  (VIBRAMYCIN ) 100 MG capsule Take 1 capsule (100 mg total) by mouth 2 (two) times daily for 7 days. 11/21/23 11/28/23 Yes Corlis Burnard DEL, NP  ergocalciferol  (VITAMIN D2) 1.25 MG (50000 UT) capsule Take 1 capsule (50,000 Units total) by mouth once a week. 03/22/23  Yes Tullo, Teresa L, MD  Famotidine (PEPCID PO) Take 1 tablet by mouth daily with supper.   Yes [provider]  pantoprazole  (PROTONIX ) 40 MG tablet Take 1 tablet (40 mg total) by mouth daily. 09/05/23  Yes Marylynn Verneita CROME, MD  acetaminophen -codeine  (TYLENOL  #3) 300-30 MG tablet Take 1 tablet by mouth every 4 (four) hours as needed for moderate pain (pain score 4-6). 09/05/23   Marylynn Verneita CROME, MD  adalimumab  (HUMIRA , 2 PEN,) 40 MG/0.4ML pen Inject 40 mg subcutaneously every 14 (fourteen) days 07/20/23     diazepam  (VALIUM ) 2 MG tablet Take 1 tablet (2 mg total) by mouth every 12 (twelve) hours as needed. for anxiety 09/05/23   Marylynn Verneita CROME,  MD  hydrocortisone  (ANUSOL -HC) 2.5 % rectal cream Place 1 Application rectally 2 (two) times daily. After bowel movement and at at bedtime are optimal 02/15/23   Avram Lupita BRAVO, MD  INS SYRINGE/NEEDLE 1CC/28G (B-D INSULIN  SYRINGE 1CC/28G) 28G X 1/2 1 ML MISC For use with b12 injections 03/31/16   Marylynn Verneita CROME, MD  mirabegron  ER (MYRBETRIQ ) 25 MG TB24 tablet Take 1 tablet (25 mg total) by mouth daily. 04/13/23   Marilynne Rosaline SAILOR, MD  ondansetron  (ZOFRAN ) 4 MG tablet TAKE 1 TABLET BY MOUTH EVERY 8 HOURS AS NEEDED FOR NAUSEA AND/OR VOMITING 04/26/23   Marylynn Verneita CROME, MD  SYRINGE-NEEDLE, DISP, 3  ML 25G X 1 3 ML MISC Use to inject B12. 06/28/21   Tullo, Teresa L, MD  triamcinolone  cream (KENALOG ) 0.1 % Apply 1 Application topically 2 (two) times daily. 11/28/22   Marylynn Verneita CROME, MD  TUBERCULIN SYR 1CC/27GX1/2 (B-D TB SYRINGE 1CC/27GX1/2) 27G X 1/2 1 ML MISC Use to inject methotrexate  09/28/20     ZOLMitriptan  (ZOMIG ) 2.5 MG tablet Take 0.5-1 tablets (1.25-2.5 mg total) by mouth daily as needed for migraines. 02/26/23   Marylynn Verneita CROME, MD  etanercept  (ENBREL  SURECLICK) 50 MG/ML injection Inject 1 mL (50 mg total) subcutaneously once a week 09/06/22 03/13/23      Family History Family History  Problem Relation Age of Onset   Diabetes Mother    Hypertension Mother    Breast cancer Mother    Uterine cancer Mother    GER disease Father    GER disease Sister    Migraines Sister    GER disease Sister    GER disease Brother    Hypertension Brother    Hyperlipidemia Brother    Heart disease Paternal Uncle    Heart disease Paternal Grandmother    Colon polyps Neg Hx    Crohn's disease Neg Hx    Esophageal cancer Neg Hx    Rectal cancer Neg Hx    Stomach cancer Neg Hx     Social History Social History   Tobacco Use   Smoking status: Never   Smokeless tobacco: Never  Vaping Use   Vaping status: Never Used  Substance Use Topics   Alcohol use: No   Drug use: No     Allergies    Methotrexate , Promethazine hcl, Sumatriptan, Celecoxib, Rizatriptan, Tape, and Upadacitinib    Review of Systems Review of Systems  Constitutional:  Negative for chills and fever.  HENT:  Positive for congestion. Negative for ear pain and sore throat.   Respiratory:  Positive for cough and shortness of breath.   Cardiovascular:  Negative for chest pain and palpitations.     Physical Exam Triage Vital Signs ED Triage Vitals  Encounter Vitals Group     BP      Girls Systolic BP Percentile      Girls Diastolic BP Percentile      Boys Systolic BP Percentile      Boys Diastolic BP Percentile      Pulse      Resp      Temp      Temp src      SpO2      Weight      Height      Head Circumference      Peak Flow      Pain Score      Pain Loc      Pain Education      Exclude from Growth Chart    No data found.  Updated Vital Signs BP 118/77   Pulse 83   Temp 98.1 F (36.7 C)   Resp 18   SpO2 96%   Visual Acuity Right Eye Distance:   Left Eye Distance:   Bilateral Distance:    Right Eye Near:   Left Eye Near:    Bilateral Near:     Physical Exam Constitutional:      General: She is not in acute distress. HENT:     Right Ear: Tympanic membrane normal.     Left Ear: Tympanic membrane normal.     Nose: Nose normal.     Mouth/Throat:  Mouth: Mucous membranes are moist.     Pharynx: Oropharynx is clear.  Cardiovascular:     Rate and Rhythm: Normal rate and regular rhythm.     Heart sounds: Normal heart sounds.  Pulmonary:     Effort: Pulmonary effort is normal. No respiratory distress.     Breath sounds: Rhonchi present.     Comments: Bilateral rhonchi, worse on left.  Neurological:     Mental Status: She is alert.      UC Treatments / Results  Labs (all labs ordered are listed, but only abnormal results are displayed) Labs Reviewed - No data to display  EKG   Radiology DG Chest 2 View Result Date: 11/21/2023 CLINICAL DATA:  Cough and  congestion. EXAM: CHEST - 2 VIEW COMPARISON:  04/25/2022 FINDINGS: The heart size and mediastinal contours are within normal limits. Stable chronic lung disease. There is no evidence of pulmonary edema, consolidation, pneumothorax, nodule or pleural fluid. The visualized skeletal structures are unremarkable. IMPRESSION: No acute findings. Chronic lung disease. Electronically Signed   By: Marcey Moan M.D.   On: 11/21/2023 16:02    Procedures Procedures (including critical care time)  Medications Ordered in UC Medications - No data to display  Initial Impression / Assessment and Plan / UC Course  I have reviewed the triage vital signs and the nursing notes.  Pertinent labs & imaging results that were available during my care of the patient were reviewed by me and considered in my medical decision making (see chart for details).   Cough, shortness of breath, acute upper respiratory infection.  O2 sat 93-96% on room air.  CXR negative for acute finding.  Treating with additional 7-day course of doxycycline  as patient reports improvement while recently on this medication.  Instructed patient to follow-up with her PCP tomorrow.  ED precautions discussed.  Education provided on cough and shortness of breath.  Patient and her husband agree to plan of care.  Final Clinical Impressions(s) / UC Diagnoses   Final diagnoses:  Acute cough  Shortness of breath  Acute upper respiratory infection     Discharge Instructions      Follow up with your primary care provider tomorrow.  Go to the emergency department if you have worsening symptoms.    Take an additional 7 days of doxycycline  as prescribed.       ED Prescriptions     Medication Sig Dispense Auth. Provider   doxycycline  (VIBRAMYCIN ) 100 MG capsule Take 1 capsule (100 mg total) by mouth 2 (two) times daily for 7 days. 14 capsule Corlis Burnard DEL, NP      PDMP not reviewed this encounter.   Corlis Burnard DEL, NP 11/21/23 1626

## 2023-11-21 NOTE — Telephone Encounter (Signed)
 Copied from CRM [id number]. Topic: Clinical - Medication Question >> Nov 21, 2023  8:35 AM Thersia BROCKS wrote: Cathlyn call in stated he really needs the answer today, or medication will be denied would like a callback  [phone]

## 2023-11-22 ENCOUNTER — Other Ambulatory Visit: Payer: Self-pay | Admitting: Pharmacist

## 2023-11-22 ENCOUNTER — Ambulatory Visit

## 2023-11-22 ENCOUNTER — Ambulatory Visit (HOSPITAL_COMMUNITY): Payer: Self-pay

## 2023-11-22 NOTE — Progress Notes (Signed)
 Prior Authorization:   Medication: zolmitriptan  Prior Authorization Submitted: 11/22/23 - Denied due to already denied 11/02/23   Spoke with patient's husband via telephone to discuss appeal / next steps.   No record of PA submitted 11/02/23. Unclear how criteria were entered.   Per [employer] denial letter in media tab: We have 65 calendar days to appeal the denial decision.   Coverage Criteria: Medication may be approved when member has tried at least three alternative formulary medication in the same drug class or category.   Preferred Triptans Sumatriptan (tried/failed - palpitations) Rizatriptan (tried/failed - palpitations) Naratriptan (no record of trial)   Reasonable to try for appeal with the argument that three formulary agents have been tried.  - sumatriptan oral tablet - sumatriptan intramuscular injection - rizatriptan oral tablet

## 2023-11-22 NOTE — Telephone Encounter (Signed)
 Alexandra Lewis called back again requesting to speak with someone in office who can assist with a medication appeal for medication ZOLMitriptan  (ZOMIG ) 2.5 MG tablet. Attempt to contact [employer] and was placed on a lengthy hold. Per Alexandra Lewis they will make the decision on the medication based off current information they have. CB#[phone]

## 2023-11-22 NOTE — Progress Notes (Signed)
 Medication Access  Reason for Call: Prior authorization  Summary: zolmitriptan  previously approved through insurance, Prior auth expired 11/17/23 [employer] [facility] [id number]) Dx: G43.909  Tx start date previously documented: 12/09/2011 Previous therapies: Sumatriptan, Ibuprofen, Tylenol , Rizatriptan Rationale for formulary exception: Had Palpitations with both Rizatriptan and Sumatriptan  11/22/23: Key: [id number] - sent to plan 11/22/23

## 2023-11-26 ENCOUNTER — Other Ambulatory Visit (HOSPITAL_COMMUNITY): Payer: Self-pay

## 2023-11-28 ENCOUNTER — Ambulatory Visit: Admission: RE | Admit: 2023-11-28 | Source: Ambulatory Visit

## 2023-11-28 ENCOUNTER — Other Ambulatory Visit: Payer: Self-pay

## 2023-11-28 NOTE — Progress Notes (Signed)
 Specialty Pharmacy Refill Coordination Note  Spoke to patient's husband, [name] is a 68 y.o. female contacted today regarding refills of specialty medication(s) Adalimumab  (Humira  (2 Pen))   Patient requested Delivery   Delivery date: 12/04/23   Verified address: [address] [place] [address]   Medication will be filled on 12/03/23.

## 2023-11-29 ENCOUNTER — Encounter: Payer: Self-pay | Admitting: Pharmacist

## 2023-11-29 NOTE — Progress Notes (Signed)
 Printed out and faxed to [employer]

## 2023-11-29 NOTE — Progress Notes (Addendum)
 Prior Authorization Denial / Appeal Request:   Medication: zolmitriptan  Appeals request  Appeal letter written and uploaded to Media Tab.  Appeal letter uploaded to PCP eFax for review/Fax to:   [facility] Expedited [employer] Grievances: Fax [phone]   -.-.-.-.-.-.-.-.-.-.-.-.-.-.-.-.-.-.-.- 12/05/23: APPEAL APPROVED Coverage Through 11/29/2024

## 2023-11-30 NOTE — Telephone Encounter (Unsigned)
 Copied from CRM [id number]. Topic: General - Other >> Nov 30, 2023  4:21 PM [place] E wrote: Reason for CRM: Alisha from [facility] called in to state that the patient's appeal for ZOLMitriptan  (ZOMIG ) 2.5 MG tablet has been approved. Authorization for this medication is valid through July 11th 2026. They will  be faxing over a letter as well.

## 2023-12-03 NOTE — Telephone Encounter (Signed)
Pt notified by mychart

## 2023-12-04 NOTE — Telephone Encounter (Signed)
 Noted

## 2023-12-10 ENCOUNTER — Other Ambulatory Visit (HOSPITAL_COMMUNITY): Payer: Self-pay

## 2023-12-10 ENCOUNTER — Other Ambulatory Visit: Payer: Self-pay | Admitting: Internal Medicine

## 2023-12-10 DIAGNOSIS — M542 Cervicalgia: Secondary | ICD-10-CM

## 2023-12-11 ENCOUNTER — Other Ambulatory Visit: Payer: Self-pay | Admitting: Internal Medicine

## 2023-12-11 ENCOUNTER — Telehealth: Payer: Self-pay | Admitting: Internal Medicine

## 2023-12-11 ENCOUNTER — Other Ambulatory Visit (HOSPITAL_COMMUNITY): Payer: Self-pay

## 2023-12-11 DIAGNOSIS — M542 Cervicalgia: Secondary | ICD-10-CM

## 2023-12-11 MED ORDER — METHOCARBAMOL 500 MG PO TABS: 500.0000 mg | ORAL_TABLET | Freq: Four times a day (QID) | ORAL | 6 refills | Status: AC

## 2023-12-11 NOTE — Telephone Encounter (Signed)
 Copied from CRM [id number]. Topic: General - Other >> Dec 11, 2023  4:45 PM Gennette ORN wrote: Reason for CRM: Tanda patient husband [phone] is calling about  methocarbamol  500MG  it's not listed on my chart and also as well he wants it sent to [facility]. He stated pharmacy sent out a form to be filled out as well.

## 2023-12-12 ENCOUNTER — Telehealth: Admitting: Physician Assistant

## 2023-12-12 DIAGNOSIS — R3989 Other symptoms and signs involving the genitourinary system: Secondary | ICD-10-CM | POA: Diagnosis not present

## 2023-12-12 MED ORDER — CEPHALEXIN 500 MG PO CAPS: 500.0000 mg | ORAL_CAPSULE | Freq: Two times a day (BID) | ORAL | 0 refills | Status: AC

## 2023-12-12 NOTE — Progress Notes (Signed)
 I have spent 5 minutes in review of e-visit questionnaire, review and updating patient chart, medical decision making and response to patient.   Piedad Climes, PA-C

## 2023-12-12 NOTE — Progress Notes (Signed)

## 2023-12-18 ENCOUNTER — Ambulatory Visit
Admission: RE | Admit: 2023-12-18 | Discharge: 2023-12-18 | Disposition: A | Discharge status: Home / Self Care | Source: Ambulatory Visit | Attending: Neurology | Admitting: Neurology

## 2023-12-18 DIAGNOSIS — R29898 Other symptoms and signs involving the musculoskeletal system: Secondary | ICD-10-CM | POA: Diagnosis present

## 2023-12-24 ENCOUNTER — Other Ambulatory Visit: Payer: Self-pay

## 2023-12-26 ENCOUNTER — Other Ambulatory Visit: Payer: Self-pay

## 2023-12-28 ENCOUNTER — Other Ambulatory Visit: Payer: Self-pay

## 2023-12-28 ENCOUNTER — Telehealth: Payer: Self-pay

## 2023-12-28 NOTE — Telephone Encounter (Signed)
 Copied from CRM [id number]. Topic: Clinical - Lab/Test Results >> Dec 28, 2023  9:13 AM Franky GRADE wrote: Reason for CRM: Patient's husband is calling regarding MRI results, He has tried contacting Dr.Shah for the results but has not heard back and was hoping Dr.Tullo can assist in contacting the provider and reviewing the results.

## 2023-12-28 NOTE — Progress Notes (Signed)
 Specialty Pharmacy Refill Coordination Note  Alexandra Lewis is a 68 y.o. female contacted today regarding refills of specialty medication(s) Adalimumab  (Humira  (2 Pen))   Patient requested Delivery   Delivery date: 01/03/24   Verified address: [address] [place] [address]   Medication will be filled on 01/02/24.

## 2023-12-31 ENCOUNTER — Other Ambulatory Visit: Payer: Self-pay | Admitting: Neurology

## 2023-12-31 DIAGNOSIS — R9389 Abnormal findings on diagnostic imaging of other specified body structures: Secondary | ICD-10-CM

## 2024-01-01 ENCOUNTER — Other Ambulatory Visit: Payer: Self-pay

## 2024-01-01 ENCOUNTER — Other Ambulatory Visit (HOSPITAL_COMMUNITY): Payer: Self-pay

## 2024-01-01 NOTE — Progress Notes (Signed)
 Patient's husband called and requested medication be delivered on 8.12.25. Per husband pharmacy documented incorrectly and she does not have an injection on hand for 8.14.25.

## 2024-01-02 ENCOUNTER — Other Ambulatory Visit: Payer: Self-pay | Admitting: Neurology

## 2024-01-02 DIAGNOSIS — R531 Weakness: Secondary | ICD-10-CM

## 2024-01-07 ENCOUNTER — Other Ambulatory Visit (HOSPITAL_COMMUNITY): Payer: Self-pay

## 2024-01-08 ENCOUNTER — Ambulatory Visit: Attending: Cardiovascular Disease | Admitting: Cardiovascular Disease

## 2024-01-08 ENCOUNTER — Encounter: Payer: Self-pay | Admitting: Cardiovascular Disease

## 2024-01-08 VITALS — BP 108/70 | HR 78 | Ht 63.0 in | Wt 87.2 lb

## 2024-01-08 DIAGNOSIS — N1831 Chronic kidney disease, stage 3a: Secondary | ICD-10-CM | POA: Diagnosis not present

## 2024-01-08 DIAGNOSIS — M0579 Rheumatoid arthritis with rheumatoid factor of multiple sites without organ or systems involvement: Secondary | ICD-10-CM

## 2024-01-08 DIAGNOSIS — G894 Chronic pain syndrome: Secondary | ICD-10-CM

## 2024-01-08 DIAGNOSIS — T466X5A Adverse effect of antihyperlipidemic and antiarteriosclerotic drugs, initial encounter: Secondary | ICD-10-CM

## 2024-01-08 DIAGNOSIS — R0602 Shortness of breath: Secondary | ICD-10-CM

## 2024-01-08 DIAGNOSIS — I209 Angina pectoris, unspecified: Secondary | ICD-10-CM

## 2024-01-08 DIAGNOSIS — T466X5D Adverse effect of antihyperlipidemic and antiarteriosclerotic drugs, subsequent encounter: Secondary | ICD-10-CM

## 2024-01-08 DIAGNOSIS — Z79899 Other long term (current) drug therapy: Secondary | ICD-10-CM

## 2024-01-08 DIAGNOSIS — M791 Myalgia, unspecified site: Secondary | ICD-10-CM

## 2024-01-08 DIAGNOSIS — R5382 Chronic fatigue, unspecified: Secondary | ICD-10-CM | POA: Diagnosis not present

## 2024-01-08 DIAGNOSIS — R072 Precordial pain: Secondary | ICD-10-CM

## 2024-01-08 MED ORDER — METOPROLOL TARTRATE 50 MG PO TABS: 50.0000 mg | ORAL_TABLET | Freq: Once | ORAL | 0 refills | Status: AC

## 2024-01-08 NOTE — Patient Instructions (Addendum)
 Medication Instructions:   No changes  Metoprolol  Tartrate 50 MG once two hours prior to Cardiac CT.   If you need a refill on your cardiac medications before your next appointment, please call your pharmacy.   Lab work: Your provider would like for you to return to have the following labs drawn: BMP.   Please go to [facility] [address] [facility] [address], [place] [address] You do not need an appointment.  They are open from 8 am- 4:30 pm.  Lunch from 1:00 pm- 2:00 pm You will not need to be fasting.   Testing/Procedures:   Your cardiac CT will be scheduled at one of the below locations:   [facility] [address] [place], [place] [address] [phone]  Please follow these instructions carefully (unless otherwise directed):  An IV will be required for this test and Nitroglycerin will be given.   On the Night Before the Test: Be sure to Drink plenty of water. Do not consume any caffeinated/decaffeinated beverages or chocolate 12 hours prior to your test. Do not take any antihistamines 12 hours prior to your test.   On the Day of the Test: Drink plenty of water until 1 hour prior to the test. Do not eat any food 1 hour prior to test. You may take your regular medications prior to the test.  Take metoprolol  (Lopressor ) two hours prior to test. If you take Furosemide/Hydrochlorothiazide/Spironolactone/Chlorthalidone, please HOLD on the morning of the test. Patients who wear a continuous glucose monitor MUST remove the device prior to scanning. FEMALES- please wear underwire-free bra if available, avoid dresses & tight clothing       After the Test: Drink plenty of water. After receiving IV contrast, you may experience a mild flushed feeling. This is normal. On occasion, you may experience a mild rash up to 24 hours after the test. This is not dangerous. If this occurs, you can take Benadryl 25 mg, Zyrtec,  Claritin, or Allegra and increase your fluid intake. (Patients taking Tikosyn should avoid Benadryl, and may take Zyrtec, Claritin, or Allegra) If you experience trouble breathing, this can be serious. If it is severe call 911 IMMEDIATELY. If it is mild, please call our office.  We will call to schedule your test 2-4 weeks out understanding that some insurance companies will need an authorization prior to the service being performed.   For more information and frequently asked questions, please visit our website : [email or web address]  For non-scheduling related questions, please contact the cardiac imaging nurse navigator should you have any questions/concerns: Cardiac Imaging Nurse Navigators Direct Office Dial: [phone]   For scheduling needs, including cancellations and rescheduling, please call [place], [phone].   Follow-Up: At [facility], you and your health needs are our priority.  As part of our continuing mission to provide you with exceptional heart care, we have created designated Provider Care Teams.  These Care Teams include your primary Cardiologist (physician) and Advanced Practice Providers (APPs -  Physician Assistants and Nurse Practitioners) who all work together to provide you with the care you need, when you need it.  You will need a follow up appointment as needed  Providers on your designated Care Team:   Lonni Meager, NP Bernardino Bring, PA-C Cadence [place], [place]  COVID-19 Vaccine Information can be found at: [email or web address] For questions related to vaccine distribution or appointments, please email [email or web address] or call [phone].

## 2024-01-08 NOTE — Progress Notes (Signed)
 Cardiology Office Note  Date:  01/08/2024   ID:  Alexandra Lewis, DOB [birth year], MRN [id number]  PCP:  Marylynn Verneita CROME, MD   Chief Complaint  Patient presents with   New Patient (Initial Visit)    Ref by Dr. Marylynn for shortness of breath.     HPI:  Alexandra Lewis is a 69 y.o. female with past medical history of: stroke in July 2013.  Evaluated by cardiology for shortness of breath in May 2014 Osteoporosis, thoracic compression fractures Chronic fatigue, shortness of breath Who presents for new patient evaluation of shortness of breath on exertion  Presents today with her husband they both report she has had a long history of shortness of breath dating back 10 years Seen by Dr. Wonda cardiology 2014, echocardiogram at that time essentially normal study  Echo July 2020 Essentially normal study  Reports continued SOB, chest tightness Symptoms have been worse with parkinson and RA Numbness left arm, foot drop on left leg Reports significant fatigue  No prior smoking history  EKG personally reviewed by myself on todays visit EKG Interpretation Date/Time:  Tuesday January 08 2024 15:58:22 EDT Ventricular Rate:  78 PR Interval:  150 QRS Duration:  56 QT Interval:  358 QTC Calculation: 408 R Axis:   -9  Text Interpretation: Normal sinus rhythm When compared with ECG of 08-Dec-2011 18:36, No significant change was found Confirmed by Perla Lye [phone]) on 01/08/2024 4:37:15 PM    PMH:   has a past medical history of Adverse effect of salicylate- ulcer (03/26/2013), Anemia, Anxiety, Cancer (HCC) (2024), Chronic back pain, Chronic Migraine Headaches, CVA (cerebrovascular accident) (HCC) (12/05/2011), Depression, Dysplastic nevus (06/2011), GERD (gastroesophageal reflux disease), History of dysplastic nevus (08/10/2011), History of peptic ulcer disease, adenomatous polyp of colon (02/08/2023), IBS (irritable bowel syndrome), Loss of weight, NSAID-induced gastric ulcer-  pylorus (01/17/2013), Osteoporosis, Partial gastric outlet obstruction (07/04/2006), Postgastric surgery syndromes, Pyloric stenosis (11/25/2008), RA (rheumatoid arthritis) (HCC), Seizure (HCC) (2000), and Seizures (HCC).  PSH:    Past Surgical History:  Procedure Laterality Date   BREAST SURGERY Bilateral    fibrous mass removed   COLONOSCOPY  02/08/2023   1 mm adenoma   ESOPHAGOGASTRODUODENOSCOPY  04/13/2009   S/P EGD with dilitation of pylorus-Dr. Rosalie   ESOPHAGOGASTRODUODENOSCOPY  11/25/2008   Status post EGD with dilatation Dr. Rosalie   ESOPHAGOGASTRODUODENOSCOPY  07/04/2006   S/P EGD and Bx-Dr. Celestia   EXCISION MORTON'S NEUROMA  1983   Right foot   Pyloroplasty with Vagotomy  06/29/2009   S/P pyloroplasty with laparoscopic truncal vagotomy and hiatal hernia repair-07/19/2009 Dr. Gladis   SEPTOPLASTY  1982   SKIN BIOPSY     TOTAL ABDOMINAL HYSTERECTOMY  2000   [facility]-Dr. DeFrancesco   TUBAL LIGATION      Current Outpatient Medications  Medication Sig Dispense Refill   acetaminophen -codeine  (TYLENOL  #3) 300-30 MG tablet Take 1 tablet by mouth every 4 (four) hours as needed for moderate pain (pain score 4-6). 180 tablet 5   adalimumab  (HUMIRA , 2 PEN,) 40 MG/0.4ML pen Inject 40mg  subcutaneously every 2 weeks as directed. 2 each 12   adalimumab  (HUMIRA , 2 PEN,) 40 MG/0.4ML pen Inject 40 mg subcutaneously every 14 (fourteen) days 6 each 1   butalbital -aspirin -caffeine -codeine  (FIORINAL/CODEINE  #3) 50-325-40-30 MG capsule Take 1 capsule by mouth every 8 (eight) hours as needed for pain. 90 capsule 5   calcitonin, salmon, (MIACALCIN /FORTICAL) 200 UNIT/ACT nasal spray Place 1 spray into alternate nostrils daily. 3.7 mL 12   carbidopa-levodopa (SINEMET  IR) 25-100 MG tablet Take 1 tablet by mouth 3 (three) times daily.     Cholecalciferol (VITAMIN D -1000 MAX ST) 25 MCG (1000 UT) tablet Take 1,000 Units by mouth daily.     cyanocobalamin  (VITAMIN B12) 1000 MCG/ML injection  Inject 1,000 mcg into the skin every 30 (thirty) days.     diazepam  (VALIUM ) 2 MG tablet Take 1 tablet (2 mg total) by mouth every 12 (twelve) hours as needed. for anxiety 60 tablet 5   divalproex  (DEPAKOTE ) 125 MG DR tablet TAKE 1 TABLET BY MOUTH DAILY 90 tablet 2   ergocalciferol  (VITAMIN D2) 1.25 MG (50000 UT) capsule Take 1 capsule (50,000 Units total) by mouth once a week. 12 capsule 5   Famotidine (PEPCID PO) Take 1 tablet by mouth daily with supper.     hydrocortisone  (ANUSOL -HC) 2.5 % rectal cream Place 1 Application rectally 2 (two) times daily. After bowel movement and at at bedtime are optimal 30 g 1   INS SYRINGE/NEEDLE 1CC/28G (B-D INSULIN  SYRINGE 1CC/28G) 28G X 1/2 1 ML MISC For use with b12 injections 10 each 3   methocarbamol  (ROBAXIN ) 500 MG tablet Take 500 mg by mouth every 6 (six) hours as needed.     mirabegron  ER (MYRBETRIQ ) 25 MG TB24 tablet Take 1 tablet (25 mg total) by mouth daily. 30 tablet 11   ondansetron  (ZOFRAN ) 4 MG tablet TAKE 1 TABLET BY MOUTH EVERY 8 HOURS AS NEEDED FOR NAUSEA AND/OR VOMITING 30 tablet 4   pantoprazole  (PROTONIX ) 40 MG tablet Take 1 tablet (40 mg total) by mouth daily. 90 tablet 1   SYRINGE-NEEDLE, DISP, 3 ML 25G X 1 3 ML MISC Use to inject B12. 6 each 0   triamcinolone  cream (KENALOG ) 0.1 % Apply 1 Application topically 2 (two) times daily. 30 g 0   TUBERCULIN SYR 1CC/27GX1/2 (B-D TB SYRINGE 1CC/27GX1/2) 27G X 1/2 1 ML MISC Use to inject methotrexate  10 each 1   ZOLMitriptan  (ZOMIG ) 2.5 MG tablet Take 0.5-1 tablets (1.25-2.5 mg total) by mouth daily as needed for migraines. 8 tablet 5   No current facility-administered medications for this visit.     Allergies:   Methotrexate , Promethazine hcl, Sumatriptan, Celecoxib, Rizatriptan, Tape, and Upadacitinib    Social History:  The patient  reports that she has never smoked. She has never used smokeless tobacco. She reports that she does not drink alcohol and does not use drugs.   Family  History:   family history includes Breast cancer in her mother; Diabetes in her mother; GER disease in her brother, father, sister, and sister; Heart disease in her paternal grandmother and paternal uncle; Hyperlipidemia in her brother; Hypertension in her brother and mother; Migraines in her sister; Uterine cancer in her mother.    Review of Systems: Review of Systems  Constitutional: Negative.   HENT: Negative.    Respiratory: Negative.    Cardiovascular: Negative.   Gastrointestinal: Negative.   Musculoskeletal: Negative.   Neurological: Negative.   Psychiatric/Behavioral: Negative.    All other systems reviewed and are negative.  PHYSICAL EXAM: VS:  BP 108/70 (BP Location: Right Arm, Patient Position: Sitting, Cuff Size: Normal)   Pulse 78   Ht 5' 3 (1.6 m)   Wt 87 lb 4 oz (39.6 kg)   SpO2 96%   BMI 15.46 kg/m  , BMI Body mass index is 15.46 kg/m. Constitutional:  oriented to person, place, and time. No distress.  HENT:  Head: Grossly normal Eyes:  no discharge. No scleral icterus.  Neck: No JVD, no carotid bruits  Cardiovascular: Regular rate and rhythm, no murmurs appreciated Pulmonary/Chest: Clear to auscultation bilaterally, no wheezes or rails Abdominal: Soft.  no distension.  no tenderness.  Musculoskeletal: Normal range of motion Neurological:  normal muscle tone. Coordination normal. No atrophy Skin: Skin warm and dry Psychiatric: normal affect, pleasant  Recent Labs: 09/05/2023: ALT 13; BUN 21; Creatinine, Ser 1.22; Hemoglobin 12.2; Platelets 234.0; Potassium 4.5; Sodium 132; TSH 2.75    Lipid Panel Lab Results  Component Value Date   CHOL 201 (H) 09/05/2023   HDL 102.90 09/05/2023   LDLCALC 84 09/05/2023   TRIG 72.0 09/05/2023    Wt Readings from Last 3 Encounters:  01/08/24 87 lb 4 oz (39.6 kg)  09/05/23 95 lb 3.2 oz (43.2 kg)  04/13/23 94 lb 3.2 oz (42.7 kg)     ASSESSMENT AND PLAN:  Problem List Items Addressed This Visit     Rheumatoid  arthritis (HCC)   Relevant Medications   methocarbamol  (ROBAXIN ) 500 MG tablet   CKD stage 3a, GFR 45-59 ml/min (HCC)   Chronic pain disorder   Relevant Medications   methocarbamol  (ROBAXIN ) 500 MG tablet   Myalgia due to statin   Chronic fatigue - Primary   Relevant Orders   EKG 12-Lead (Completed)   Shortness of breath, angina Long history of symptoms, reports symptoms are getting worse 2020 with normal echo Recommended ischemic workup with cardiac CTA She is unable to treadmill in the setting of rheumatoid arthritis and Parkinson's  Fatigue Etiology unclear, possibly connected with her Parkinson's though will rule out ischemia as above Reports that she is losing weight  Rheumatoid arthritis History of chronic pain disorder Managed by primary care  Parkinson's Currently on Sinemet   Signed, Tim Caly Pellum, M.D., Ph.D. [facility] [place], [place] [phone]

## 2024-01-09 ENCOUNTER — Other Ambulatory Visit: Payer: Self-pay

## 2024-01-10 ENCOUNTER — Ambulatory Visit

## 2024-01-10 ENCOUNTER — Ambulatory Visit: Admission: RE | Admit: 2024-01-10 | Source: Ambulatory Visit

## 2024-01-15 ENCOUNTER — Ambulatory Visit
Admission: RE | Admit: 2024-01-15 | Discharge: 2024-01-15 | Disposition: A | Discharge status: Home / Self Care | Source: Ambulatory Visit | Attending: Neurology | Admitting: Neurology

## 2024-01-15 ENCOUNTER — Other Ambulatory Visit: Payer: Self-pay | Admitting: Neurology

## 2024-01-15 DIAGNOSIS — R531 Weakness: Secondary | ICD-10-CM

## 2024-01-15 DIAGNOSIS — R9389 Abnormal findings on diagnostic imaging of other specified body structures: Secondary | ICD-10-CM | POA: Diagnosis present

## 2024-01-15 MED ORDER — GADOBUTROL 1 MMOL/ML IV SOLN: 4.0000 mL | Freq: Once | INTRAVENOUS | Status: DC | PRN

## 2024-01-23 ENCOUNTER — Other Ambulatory Visit: Payer: Self-pay | Admitting: Internal Medicine

## 2024-01-24 ENCOUNTER — Other Ambulatory Visit: Payer: Self-pay

## 2024-01-24 NOTE — Progress Notes (Signed)
 Specialty Pharmacy Refill Coordination Note  Alexandra Lewis is a 68 y.o. female contacted today regarding refills of specialty medication(s) Adalimumab  (Humira  (2 Pen))   Patient requested Delivery   Delivery date: 01/29/24   Verified address: [address] [place] [address]   Medication will be filled on 09.08.25.

## 2024-01-24 NOTE — Telephone Encounter (Signed)
 Refilled: 04/26/2023 Last OV: 09/05/2023 Next OV: 02/26/2024

## 2024-01-30 ENCOUNTER — Telehealth: Payer: Self-pay | Admitting: Cardiovascular Disease

## 2024-01-30 NOTE — Telephone Encounter (Signed)
 Spoke with husband per DPR. Husband calling to verify lab hours. Husband notified of hours and verbalizes understanding.

## 2024-01-30 NOTE — Telephone Encounter (Signed)
 Husband Isadora) stated he was told patient can have her lab done in the office and wants to know when patient should come in to have this done.

## 2024-02-04 ENCOUNTER — Ambulatory Visit: Admitting: Internal Medicine

## 2024-02-04 ENCOUNTER — Encounter (HOSPITAL_COMMUNITY): Payer: Self-pay

## 2024-02-05 ENCOUNTER — Telehealth: Payer: Self-pay | Admitting: Cardiovascular Disease

## 2024-02-05 NOTE — Telephone Encounter (Signed)
 Husband calling to discuss lab results

## 2024-02-05 NOTE — Telephone Encounter (Signed)
 Called and spoke with husband per DPR. Per husband patient had BMP completed at [facility]. Per husband patient's GFR is 50 and Creatine 1.2. He wanted to make Dr. Gollan aware.

## 2024-02-06 NOTE — Telephone Encounter (Signed)
-----   Message from [facility], MD sent at 02/06/2024  8:35 AM EDT ----- Gfr is slightly lower than before, primary care provider to manage in future. IFE did not show evidence of multiple myeloma. UPEP pending.  ----- Message ----- From: Lab, Background User Sent: 02/04/2024   5:44 PM EDT To: Jannett Mickie Fairly, MD

## 2024-02-06 NOTE — Telephone Encounter (Signed)
 Seen via MyChart

## 2024-02-07 ENCOUNTER — Ambulatory Visit: Admission: RE | Admit: 2024-02-07 | Source: Ambulatory Visit

## 2024-02-11 ENCOUNTER — Encounter: Payer: Self-pay | Admitting: Internal Medicine

## 2024-02-11 ENCOUNTER — Telehealth (INDEPENDENT_AMBULATORY_CARE_PROVIDER_SITE_OTHER): Admitting: Internal Medicine

## 2024-02-11 VITALS — BP 118/72 | HR 80 | Ht 63.0 in | Wt 87.0 lb

## 2024-02-11 DIAGNOSIS — R937 Abnormal findings on diagnostic imaging of other parts of musculoskeletal system: Secondary | ICD-10-CM

## 2024-02-11 DIAGNOSIS — G894 Chronic pain syndrome: Secondary | ICD-10-CM

## 2024-02-11 DIAGNOSIS — M4722 Other spondylosis with radiculopathy, cervical region: Secondary | ICD-10-CM

## 2024-02-11 DIAGNOSIS — N1831 Chronic kidney disease, stage 3a: Secondary | ICD-10-CM

## 2024-02-11 DIAGNOSIS — T39095S Adverse effect of salicylates, sequela: Secondary | ICD-10-CM

## 2024-02-11 MED ORDER — BUTALBITAL-ASA-CAFF-CODEINE 50-325-40-30 MG PO CAPS: 1.0000 | ORAL_CAPSULE | Freq: Four times a day (QID) | ORAL | 3 refills | Status: DC | PRN

## 2024-02-11 MED ORDER — PANTOPRAZOLE SODIUM 40 MG PO TBEC
40.0000 mg | DELAYED_RELEASE_TABLET | Freq: Every day | ORAL | 1 refills | Status: AC
  Filled 2024-03-07: qty 90, 90d supply, fill #0
  Filled 2024-06-09: qty 90, 90d supply, fill #1

## 2024-02-11 NOTE — Assessment & Plan Note (Addendum)
 Her neck and back pain have become persistent and severe due to cervical spondylosis,  despite using 6 tylenol  #3 daily and 3 fiorinal daily. .  Increasing fiorinal to 4  daily and continuing  tylenol  #3   qty 6 daily. Referring to [facility] for [facility]

## 2024-02-11 NOTE — Progress Notes (Signed)
 Virtual Visit via Caregility   Note   This format is felt to be most appropriate for this patient at this time.  All issues noted in this document were discussed and addressed.  No physical exam was performed (except for noted visual exam findings with Video Visits).   I connected with [place]  on 02/11/24 at  5:00 PM EDT by a video enabled telemedicine application and verified that I am speaking with the correct person using two identifiers. Location patient: home Location provider: work or home office Persons participating in the virtual visit: patient, provider  I discussed the limitations, risks, security and privacy concerns of performing an evaluation and management service by telephone and the availability of in person appointments. I also discussed with the patient that there may be a patient responsible charge related to this service. The patient expressed understanding and agreed to proceed.  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rology after presenting with a history of  left-sided weakness( S/p stroke 11/2011 with residual left-sided weakness ), fatigue, REM behavior disorder, visual hallucinations, bradykinesia, cog-wheeling and lack of movement in the left arm, progressively worsening over a year.   She Did not tolerate Sinemet due to persistent nausea resulting in significant weight loss of 9 lbs,  reaching a nadir of 84 lbs  .    Planning to start ropinirole in the near gurure  2) she has undergone imaging  of the cervical and thoracic spines in recent months for evaluation of Persistent back pain which has become activity limiting:  unfortunately she could not tolerate prolonged immobilization for cervical and thoracic MRI's with  contrast.    01/17/2024  MRI Thoracic Spine Wo Contrast IMPRESSION:  1. Exaggerated upper thoracic kyphosis secondary to chronic superior endplate  compression  fractures of the T5 and T6 vertebral bodies with mild anterior  height loss. 2. No spinal canal stenosis or neural foraminal narrowing in the thoracic spine.   - 12/18/2023 MRI of the cervical spine to rule out cervical myelopathy. IMPRESSION:  1. Abnormal T1 dark T2 bright lesion encompassing the majority of  the right side of the C5 vertebral body. As an isolated finding, this could not be differentiated from a metastasis or plasmacytoma.  2. Degenerative spondylosis at C4-5 and C5-6. No compressive central canal stenosis. Foraminal narrowing that could possibly cause neural  compression on the left at C4-5 and bilateral at C5-6.  She has been suboptimally managing her pain with tylenol  and fiorinal , pain is currently 8/10.  She has C/I to ue of NSAIDs and prefers to avoid opioids.  She has not had an ESI  and is requesting referral    3) CKD:  last nephrology evaluation was Sept 2024 [facility] was opined to be pre renal and she was advised to increase her water intake.  There has been a slight improvement of  calculated GFR but it remains < 60 ml.min   . Screening for MM was done given concern for C5 lesion ddx of plasmacytoma. And was negative with id SPEP/UPEP/IFE ,  n  Has appt with [facility] Oct 28     ROS: See pertinent positives and negatives per HPI.  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   Chronic back pain    Chronic Migraine Headaches    CVA (cerebrovascular accident) (HCC) 12/05/2011   Left CVA(corona radiata); Right sided weakness   Depression    Dysplastic nevus 06/2011   Left abdomen s/p excision 07/2011   GERD (gastroesophageal reflux disease)    History of dysplastic nevus 08/10/2011   left abdomen, severe atypia/excision   History of peptic ulcer disease    Hx of adenomatous polyp of colon 02/08/2023   1 mm adenoma   IBS (irritable bowel  syndrome)    Loss of weight    NSAID-induced gastric ulcer- pylorus 01/17/2013   Osteoporosis    T6 compression fracture   Partial gastric outlet obstruction 07/04/2006   Postgastric surgery syndromes    Pyloric stenosis 11/25/2008   S/P Pyloroplasty- 06/29/09 - Dr. Gladis   RA (rheumatoid arthritis) [facility])    Seizure (HCC) 2000   Seizures (HCC)     Past Surgical History:  Procedure Laterality Date   BREAST SURGERY Bilateral    fibrous mass removed   COLONOSCOPY  02/08/2023   1 mm adenoma   ESOPHAGOGASTRODUODENOSCOPY  04/13/2009   S/P EGD with dilitation of pylorus-Dr. Rosalie   ESOPHAGOGASTRODUODENOSCOPY  11/25/2008   Status post EGD with dilatation Dr. Rosalie   ESOPHAGOGASTRODUODENOSCOPY  07/04/2006   S/P EGD and Bx-Dr. Celestia   EXCISION MORTON'S NEUROMA  1983   Right foot   Pyloroplasty with Vagotomy  06/29/2009   S/P pyloroplasty with laparoscopic truncal vagotomy and hiatal hernia repair-07/19/2009 Dr. Gladis   SEPTOPLASTY  1982   SKIN BIOPSY     TOTAL ABDOMINAL HYSTERECTOMY  2000   [facility]-Dr. DeFrancesco   TUBAL LIGATION      Family History  Problem Relation Age of Onset   Diabetes Mother    Hypertension Mother    Breast cancer Mother    Uterine cancer Mother    GER disease Father    GER disease Sister    Migraines Sister    GER disease Sister    GER disease Brother    Hypertension Brother    Hyperlipidemia Brother    Heart disease Paternal Uncle    Heart disease Paternal Grandmother    Colon polyps Neg Hx    Crohn's disease Neg Hx    Esophageal cancer Neg Hx    Rectal cancer Neg Hx    Stomach cancer Neg Hx     SOCIAL HX:  reports that she has never smoked. She has never used smokeless tobacco. She reports that she does not drink alcohol and does not use drugs.    Current Outpatient Medications:    acetaminophen -codeine  (TYLENOL  #3) 300-30 MG tablet, Take 1 tablet by mouth every 4 (four) hours as needed for moderate pain (pain score 4-6).,  Disp: 180 tablet, Rfl: 5   adalimumab  (HUMIRA , 2 PEN,) 40 MG/0.4ML pen, Inject 40mg  subcutaneously every 2 weeks as directed., Disp: 2 each, Rfl: 12   cyanocobalamin  (VITAMIN B12) 1000 MCG/ML injection, Inject 1,000 mcg into the skin every 30 (thirty) days., Disp: , Rfl:    diazepam  (VALIUM ) 2 MG tablet, Take 1 tablet (2 mg total) by mouth every 12 (twelve) hours as needed. for anxiety, Disp: 60 tablet, Rfl: 5   divalproex  (DEPAKOTE ) 125 MG DR tablet, TAKE 1 TABLET BY MOUTH DAILY, Disp: 90 tablet, Rfl: 2   ergocalciferol  (VITAMIN D2) 1.25 MG (50000 UT) capsule, Take 1 capsule (50,000 Units total) by mouth once a week., Disp: 12 capsule, Rfl: 5  Famotidine (PEPCID PO), Take 1 tablet by mouth daily with supper., Disp: , Rfl:    INS SYRINGE/NEEDLE 1CC/28G (B-D INSULIN  SYRINGE 1CC/28G) 28G X 1/2 1 ML MISC, For use with b12 injections, Disp: 10 each, Rfl: 3   methocarbamol  (ROBAXIN ) 500 MG tablet, Take 500 mg by mouth every 6 (six) hours as needed., Disp: , Rfl:    ondansetron  (ZOFRAN ) 4 MG tablet, TAKE 1 TABLET BY MOUTH EVERY 8 HOURS AS NEEDED FOR NAUSEA AND/OR VOMITING, Disp: 30 tablet, Rfl: 4   SYRINGE-NEEDLE, DISP, 3 ML 25G X 1 3 ML MISC, Use to inject B12., Disp: 6 each, Rfl: 0   TUBERCULIN SYR 1CC/27GX1/2 (B-D TB SYRINGE 1CC/27GX1/2) 27G X 1/2 1 ML MISC, Use to inject methotrexate , Disp: 10 each, Rfl: 1   ZOLMitriptan  (ZOMIG ) 2.5 MG tablet, Take 0.5-1 tablets (1.25-2.5 mg total) by mouth daily as needed for migraines., Disp: 8 tablet, Rfl: 5   butalbital -aspirin -caffeine -codeine  (FIORINAL/CODEINE  #3) 50-325-40-30 MG capsule, Take 1 capsule by mouth every 6 (six) hours as needed for pain., Disp: 120 capsule, Rfl: 3   calcitonin, salmon, (MIACALCIN /FORTICAL) 200 UNIT/ACT nasal spray, Place 1 spray into alternate nostrils daily. (Patient not taking: Reported on 02/11/2024), Disp: 3.7 mL, Rfl: 12   carbidopa-levodopa (SINEMET IR) 25-100 MG tablet, Take 1 tablet by mouth 3 (three) times daily.  (Patient not taking: Reported on 02/11/2024), Disp: , Rfl:    metoprolol  tartrate (LOPRESSOR ) 50 MG tablet, Take 1 tablet (50 mg total) by mouth once for 1 dose. Take two hours prior to Cardiac CT. (Patient not taking: Reported on 02/11/2024), Disp: 1 tablet, Rfl: 0   mirabegron  ER (MYRBETRIQ ) 25 MG TB24 tablet, Take 1 tablet (25 mg total) by mouth daily. (Patient not taking: Reported on 02/11/2024), Disp: 30 tablet, Rfl: 11   pantoprazole  (PROTONIX ) 40 MG tablet, Take 1 tablet (40 mg total) by mouth daily., Disp: 90 tablet, Rfl: 1   rOPINIRole (REQUIP) 0.25 MG tablet, Take 0.25 mg by mouth 3 (three) times daily. (Patient not taking: Reported on 02/11/2024), Disp: , Rfl:    triamcinolone  cream (KENALOG ) 0.1 %, Apply 1 Application topically 2 (two) times daily. (Patient not taking: Reported on 02/11/2024), Disp: 30 g, Rfl: 0  EXAM:  VITALS per patient if applicable:  GENERAL: alert, oriented, appears well and in no acute distress  HEENT: atraumatic, conjunttiva clear, no obvious abnormalities on inspection of external nose and ears  NECK: normal movements of the head and neck  LUNGS: on inspection no signs of respiratory distress, breathing rate appears normal, no obvious gross SOB, gasping or wheezing  CV: no obvious cyanosis  MS: moves all visible extremities without noticeable abnormality  PSYCH/NEURO: pleasant and cooperative, no obvious depression or anxiety, speech and thought processing grossly intact  ASSESSMENT AND PLAN: Cervical spondylosis with radiculopathy -     Ambulatory epidural steroid injection; Future  Adverse effect of salicylate, sequela -     Pantoprazole  Sodium; Take 1 tablet (40 mg total) by mouth daily.  Dispense: 90 tablet; Refill: 1  Abnormal MRI, cervical spine Assessment & Plan: During recent MRI cervical spine ,  a C5 vertebral lesion  was noted on the right side with ddx including  hemangioma vs plasmacytoma?  Needs Nuc Med Bone Scan.   Orders: -     NM  Bone Scan Whole Body; Future  Chronic pain disorder Assessment & Plan: Her neck and back pain have become persistent and severe due to cervical spondylosis,  despite using 6 tylenol  #3 daily  and 3 fiorinal daily. .  Increasing fiorinal to 4  daily and continuing  tylenol  #3   qty 6 daily. Referring to [facility] for [facility]    CKD stage 3a, GFR 45-59 ml/min (HCC) Assessment & Plan: Cr /GFR had improved slightly but not normalized.  Screening for MM was done by neurology given C5 vertebral lesion concerning for plasmacytoma.  DDX remains unclear but likely pre renal given low intake of free water and absence of any other obvious causes per [facility] Nephrology Sept 2024.  Keep follow up with Damien Chihuahua  in late October    Other orders -     Butalbital -ASA-Caff-Codeine ; Take 1 capsule by mouth every 6 (six) hours as needed for pain.  Dispense: 120 capsule; Refill: 3      I discussed the assessment and treatment plan with the patient. The patient was provided an opportunity to ask questions and all were answered. The patient agreed with the plan and demonstrated an understanding of the instructions.   The patient was advised to call back or seek an in-person evaluation if the symptoms worsen or if the condition fails to improve as anticipated.  I personally spent a total of 45 minutes in the care of the patient today including preparing to see the patient, getting/reviewing separately obtained history, placing orders, referring and communicating with other health care professionals, documenting clinical information in the EHR, and coordinating care.    Verneita LITTIE Kettering, MD

## 2024-02-11 NOTE — Assessment & Plan Note (Signed)
 Cr /GFR had improved slightly but not normalized.  Screening for MM was done by neurology given C5 vertebral lesion concerning for plasmacytoma.  DDX remains unclear but likely pre renal given low intake of free water and absence of any other obvious causes per [facility] Nephrology Sept 2024.  Keep follow up with Damien Chihuahua  in late October

## 2024-02-11 NOTE — Assessment & Plan Note (Signed)
 During recent MRI cervical spine ,  a C5 vertebral lesion  was noted on the right side with ddx including  hemangioma vs plasmacytoma?  Needs Nuc Med Bone Scan.

## 2024-02-12 ENCOUNTER — Telehealth: Payer: Self-pay

## 2024-02-12 MED ORDER — BUTALBITAL-ASA-CAFF-CODEINE 50-325-40-30 MG PO CAPS: 1.0000 | ORAL_CAPSULE | Freq: Four times a day (QID) | ORAL | 3 refills | Status: DC | PRN

## 2024-02-12 NOTE — Telephone Encounter (Signed)
 Need to let pt's husband know that mediation has been resent to pharmacy. Also let him know that I spoke with pharmacy and verified that they have received the rx and will get it ready for pick up.

## 2024-02-12 NOTE — Telephone Encounter (Signed)
 noted

## 2024-02-12 NOTE — Telephone Encounter (Unsigned)
 Copied from CRM #[id number]. Topic: General - Other >> Feb 12, 2024  3:18 PM Burnard DEL wrote: Reason for CRM: Patients husband returned call to  [employer].I informed him that her rx was resent to pharmacy and conformed by pharmacy that it was received.

## 2024-02-12 NOTE — Telephone Encounter (Signed)
 Spoke with pt's husband and he stated that pharmacy has not received the butalbital  rx that was sent in yesterday evening to [facility]. I called pharmacy and they stated that they have not received the rx that was in last night for the increased amount at 8:03 pm. They are needing the rx to be resent.

## 2024-02-12 NOTE — Addendum Note (Signed)
 Addended by: MARYLYNN VERNEITA CROME on: 02/12/2024 01:16 PM   Modules accepted: Orders

## 2024-02-12 NOTE — Telephone Encounter (Signed)
 Copied from CRM #[id number]. Topic: Clinical - Medical Advice >> Feb 12, 2024 12:20 PM Turkey A wrote: Reason for CRM: Patient's spouse would like to speak with Harlene regarding appt on 02/11/24-please contact

## 2024-02-14 ENCOUNTER — Other Ambulatory Visit (HOSPITAL_COMMUNITY): Payer: Self-pay

## 2024-02-15 ENCOUNTER — Other Ambulatory Visit (HOSPITAL_COMMUNITY): Payer: Self-pay

## 2024-02-15 ENCOUNTER — Other Ambulatory Visit: Payer: Self-pay

## 2024-02-18 ENCOUNTER — Other Ambulatory Visit: Payer: Self-pay | Admitting: Pharmacy Technician

## 2024-02-18 ENCOUNTER — Other Ambulatory Visit: Payer: Self-pay

## 2024-02-18 NOTE — Progress Notes (Signed)
 Specialty Pharmacy Refill Coordination Note  Alexandra Lewis is a 68 y.o. female contacted today regarding refills of specialty medication(s) Adalimumab  (Humira  (2 Pen))   Patient requested Delivery   Delivery date: 02/26/24   Verified address: [address]   [place] [address]   Medication will be filled on 02/25/24. Injection due 10/9. Spoke to patient's spouse.

## 2024-02-20 ENCOUNTER — Other Ambulatory Visit

## 2024-02-22 ENCOUNTER — Other Ambulatory Visit (HOSPITAL_COMMUNITY): Payer: Self-pay

## 2024-02-22 ENCOUNTER — Ambulatory Visit

## 2024-02-22 ENCOUNTER — Ambulatory Visit: Admission: RE | Admit: 2024-02-22

## 2024-02-26 ENCOUNTER — Encounter (HOSPITAL_COMMUNITY): Payer: Self-pay

## 2024-02-26 ENCOUNTER — Ambulatory Visit: Admitting: Internal Medicine

## 2024-02-28 ENCOUNTER — Ambulatory Visit

## 2024-03-05 ENCOUNTER — Other Ambulatory Visit (HOSPITAL_COMMUNITY): Payer: Self-pay

## 2024-03-05 ENCOUNTER — Other Ambulatory Visit: Payer: Self-pay

## 2024-03-05 ENCOUNTER — Encounter

## 2024-03-07 ENCOUNTER — Other Ambulatory Visit (HOSPITAL_COMMUNITY): Payer: Self-pay

## 2024-03-07 ENCOUNTER — Other Ambulatory Visit: Payer: Self-pay | Admitting: Internal Medicine

## 2024-03-07 DIAGNOSIS — T39095S Adverse effect of salicylates, sequela: Secondary | ICD-10-CM

## 2024-03-09 ENCOUNTER — Other Ambulatory Visit: Payer: Self-pay

## 2024-03-10 MED ORDER — PANTOPRAZOLE SODIUM 40 MG PO TBEC
40.0000 mg | DELAYED_RELEASE_TABLET | Freq: Every day | ORAL | 1 refills | Status: AC
  Filled 2024-03-10: qty 90, 90d supply, fill #0

## 2024-03-12 ENCOUNTER — Other Ambulatory Visit (HOSPITAL_COMMUNITY): Payer: Self-pay

## 2024-03-13 ENCOUNTER — Other Ambulatory Visit: Payer: Self-pay | Admitting: Internal Medicine

## 2024-03-14 ENCOUNTER — Telehealth: Payer: Self-pay | Admitting: Internal Medicine

## 2024-03-14 NOTE — Telephone Encounter (Signed)
 Copied from CRM [id number]. Topic: Clinical - Medication Refill >> Mar 14, 2024 10:39 AM Tanazia G wrote: Medication: acetaminophen -codeine  (TYLENOL  #3) 300-30 MG tablet  Has the patient contacted their pharmacy? Yes (Agent: If no, request that the patient contact the pharmacy for the refill. If patient does not wish to contact the pharmacy document the reason why and proceed with request.) (Agent: If yes, when and what did the pharmacy advise?)  This is the patient's preferred pharmacy:   [id number] [place] - [address] [place] [address] Phone: [phone] Fax: [phone]  Is this the correct pharmacy for this prescription? Yes If no, delete pharmacy and type the correct one.   Has the prescription been filled recently? Yes  Is the patient out of the medication? Yes  Has the patient been seen for an appointment in the last year OR does the patient have an upcoming appointment? Yes  Can we respond through MyChart? Yes  Agent: Please be advised that Rx refills may take up to 3 business days. We ask that you follow-up with your pharmacy.

## 2024-03-18 ENCOUNTER — Other Ambulatory Visit: Payer: Self-pay

## 2024-03-18 NOTE — Progress Notes (Signed)
 Specialty Pharmacy Refill Coordination Note  Alexandra Lewis is a 68 y.o. female contacted today regarding refills of specialty medication(s) Adalimumab  (Humira  (2 Pen))   Patient requested Delivery   Delivery date: 03/25/24   Verified address: [address]   [place] [address]   Medication will be filled on: 03/24/24

## 2024-03-19 ENCOUNTER — Other Ambulatory Visit: Payer: Self-pay | Admitting: Internal Medicine

## 2024-03-24 ENCOUNTER — Other Ambulatory Visit: Payer: Self-pay

## 2024-04-08 ENCOUNTER — Encounter (HOSPITAL_COMMUNITY): Payer: Self-pay

## 2024-04-10 ENCOUNTER — Ambulatory Visit: Admission: RE | Admit: 2024-04-10 | Source: Ambulatory Visit

## 2024-04-14 ENCOUNTER — Other Ambulatory Visit: Payer: Self-pay

## 2024-04-14 ENCOUNTER — Other Ambulatory Visit (HOSPITAL_COMMUNITY): Payer: Self-pay

## 2024-04-14 NOTE — Progress Notes (Signed)
 Specialty Pharmacy Refill Coordination Note  Spoke with Cyndee Ingle F (Husband)  Alexandra Lewis is a 68 y.o. female contacted today regarding refills of specialty medication(s) Adalimumab  (Humira  (2 Pen))  Doses on hand: 0  Injection date: 04/24/24   Patient requested: Delivery   Delivery date: 04/22/24   Verified address: [address] [place] [address]  Medication will be filled on 04/21/24

## 2024-04-16 ENCOUNTER — Other Ambulatory Visit: Payer: Self-pay

## 2024-04-16 ENCOUNTER — Other Ambulatory Visit (HOSPITAL_COMMUNITY): Payer: Self-pay

## 2024-04-16 NOTE — Progress Notes (Signed)
 Specialty Pharmacy Ongoing Clinical Assessment Note  Alexandra Lewis is a 68 y.o. female who is being followed by the specialty pharmacy service for RxSp Rheumatoid Arthritis   Patient's specialty medication(s) reviewed today: Adalimumab  (Humira  (2 Pen))   Missed doses in the last 4 weeks: 0   Patient/Caregiver did not have any additional questions or concerns.   Therapeutic benefit summary: Patient is achieving benefit   Adverse events/side effects summary: No adverse events/side effects   Patient's therapy is appropriate to: Continue    Goals Addressed             This Visit's Progress    Minimize recurrence of flares       Patient is on track. Patient will maintain adherence          Follow up: 12 months  Tiffay Pinette M [name] Specialty Pharmacist

## 2024-04-21 ENCOUNTER — Other Ambulatory Visit: Payer: Self-pay

## 2024-04-22 ENCOUNTER — Other Ambulatory Visit (HOSPITAL_COMMUNITY): Payer: Self-pay

## 2024-04-22 MED FILL — Divalproex Sodium Tab Delayed Release 125 MG: ORAL | 90 days supply | Qty: 90 | Fill #0 | Status: AC

## 2024-04-23 ENCOUNTER — Other Ambulatory Visit: Payer: Self-pay

## 2024-04-24 ENCOUNTER — Encounter (HOSPITAL_COMMUNITY): Payer: Self-pay

## 2024-04-24 ENCOUNTER — Telehealth: Admitting: Physician Assistant

## 2024-04-24 DIAGNOSIS — R300 Dysuria: Secondary | ICD-10-CM

## 2024-04-24 NOTE — Progress Notes (Signed)
  Because you have had a fever for the last few days, I feel your condition warrants further evaluation and I recommend that you be seen in a face-to-face visit.   NOTE: There will be NO CHARGE for this E-Visit   If you are having a true medical emergency, please call 911.     For an urgent face to face visit, [place] has multiple urgent care centers for your convenience.  Click the link below for the full list of locations and hours, walk-in wait times, appointment scheduling options and driving directions:  Urgent Care - [place], [place], [place], [place], [place], [place]  Jud     Your MyChart E-visit questionnaire answers were reviewed by a board certified advanced clinical practitioner to complete your personal care plan based on your specific symptoms.    Thank you for using e-Visits.

## 2024-04-25 ENCOUNTER — Telehealth

## 2024-04-26 ENCOUNTER — Telehealth: Admitting: Nurse Practitioner

## 2024-04-26 DIAGNOSIS — R300 Dysuria: Secondary | ICD-10-CM

## 2024-04-26 MED ORDER — CEPHALEXIN 500 MG PO CAPS: 500.0000 mg | ORAL_CAPSULE | Freq: Two times a day (BID) | ORAL | 0 refills | Status: AC

## 2024-04-26 NOTE — Progress Notes (Signed)
 Virtual Visit Consent   Alexandra Lewis, you are scheduled for a virtual visit with a [facility] provider today. Just as with appointments in the office, your consent must be obtained to participate. Your consent will be active for this visit and any virtual visit you may have with one of our providers in the next 365 days. If you have a MyChart account, a copy of this consent can be sent to you electronically.  As this is a virtual visit, video technology does not allow for your provider to perform a traditional examination. This may limit your provider's ability to fully assess your condition. If your provider identifies any concerns that need to be evaluated in person or the need to arrange testing (such as labs, EKG, etc.), we will make arrangements to do so. Although advances in technology are sophisticated, we cannot ensure that it will always work on either your end or our end. If the connection with a video visit is poor, the visit may have to be switched to a telephone visit. With either a video or telephone visit, we are not always able to ensure that we have a secure connection.  By engaging in this virtual visit, you consent to the provision of healthcare and authorize for your insurance to be billed (if applicable) for the services provided during this visit. Depending on your insurance coverage, you may receive a charge related to this service.  I need to obtain your verbal consent now. Are you willing to proceed with your visit today? Alexandra Lewis has provided verbal consent on 04/26/2024 for a virtual visit (video or telephone). Alexandra LELON Servant, NP  Date: 04/26/2024 4:24 PM   Virtual Visit via Video Note   I, Alexandra Lewis, connected with  Alexandra Lewis  ([id number], 1956-05-12) on 04/26/24 at  4:15 PM EST by a video-enabled telemedicine application and verified that I am speaking with the correct person using two identifiers.  Location: Patient: Virtual Visit  Location Patient: Home Provider: Virtual Visit Location Provider: Home Office   I discussed the limitations of evaluation and management by telemedicine and the availability of in person appointments. The patient expressed understanding and agreed to proceed.    History of Present Illness: Alexandra Lewis is a 68 y.o. who identifies as a female who was assigned female at birth, and is being seen today for UTI symptoms .  A few days ago Alexandra Lewis started experiencing urinary urgency and frequency, dysuria. She started taking azo for pain relief yesterday. Denies fever, flank pain or pelvic pain.   Problems:  Patient Active Problem List   Diagnosis Date Noted   Abnormal MRI, cervical spine 02/11/2024   Encounter for preventive health examination 09/06/2023   CKD stage 3a, GFR 45-59 ml/min (HCC) 09/06/2023   Left-sided weakness 08/08/2023   Levator spasm 04/13/2023   Overactive bladder 04/13/2023   Hx of pyloroplasty 03/07/2022   Shortness of breath 03/07/2022   Night terrors, adult 05/01/2021   Myalgia due to statin 04/29/2021   Chronic pain disorder 11/07/2020   Rheumatoid arthritis (HCC) 08/19/2019   Narcotic dependency, continuous (HCC) 08/19/2019   Wrist pain, chronic, right 01/07/2019   History of lacunar cerebrovascular accident 03/28/2018   Chronic fatigue 03/28/2018   Long-term use of high-risk medication 12/30/2015   Adverse effect of salicylate- ulcer 03/26/2013   NSAID-induced gastric ulcer- pylorus 01/17/2013   Dysplastic nevus 06/2011   Episodic mood disorder 06/22/2010   Migraine headache 06/22/2010   Irritable  bowel syndrome 06/22/2010   POSTGASTRIC SURGERY SYNDROMES 06/22/2010   Osteoporosis 06/22/2010   WEIGHT LOSS-ABNORMAL 06/22/2010   NAUSEA AND VOMITING 06/22/2010   PERSONAL HISTORY OF PEPTIC ULCER DISEASE 06/22/2010    Allergies:  Allergies  Allergen Reactions   Methotrexate  Nausea Only    Other reaction(s): Vomiting Fatigue   Promethazine  Hcl     Dystonic reaction   Sumatriptan Other (See Comments)   Celecoxib Rash   Rizatriptan Palpitations   Tape Rash   Upadacitinib  Other (See Comments)    severe abdominal pain   Medications:  Current Outpatient Medications:    cephALEXin  (KEFLEX ) 500 MG capsule, Take 1 capsule (500 mg total) by mouth 2 (two) times daily for 7 days., Disp: 14 capsule, Rfl: 0   acetaminophen -codeine  (TYLENOL  #3) 300-30 MG tablet, TAKE 1 TABLET BY MOUTH EVERY 4 (FOUR) HOURS AS NEEDED FOR MODERATE PAIN (PAIN SCORE 4-6)., Disp: 180 tablet, Rfl: 3   adalimumab  (HUMIRA , 2 PEN,) 40 MG/0.4ML pen, Inject 40mg  subcutaneously every 2 weeks as directed., Disp: 2 each, Rfl: 12   butalbital -aspirin -caffeine -codeine  (ASCOMP-CODEINE ) 50-325-40-30 MG capsule, Take 1 capsule by mouth every 8 (eight) hours as needed for pain., Disp: 90 capsule, Rfl: 2   cyanocobalamin  (VITAMIN B12) 1000 MCG/ML injection, Inject 1,000 mcg into the skin every 30 (thirty) days., Disp: , Rfl:    diazepam  (VALIUM ) 2 MG tablet, Take 1 tablet (2 mg total) by mouth every 12 (twelve) hours as needed. for anxiety, Disp: 60 tablet, Rfl: 5   divalproex  (DEPAKOTE ) 125 MG DR tablet, TAKE 1 TABLET BY MOUTH DAILY, Disp: 90 tablet, Rfl: 2   ergocalciferol  (VITAMIN D2) 1.25 MG (50000 UT) capsule, Take 1 capsule (50,000 Units total) by mouth once a week., Disp: 12 capsule, Rfl: 5   Famotidine (PEPCID PO), Take 1 tablet by mouth daily with supper., Disp: , Rfl:    INS SYRINGE/NEEDLE 1CC/28G (B-D INSULIN  SYRINGE 1CC/28G) 28G X 1/2 1 ML MISC, For use with b12 injections, Disp: 10 each, Rfl: 3   methocarbamol  (ROBAXIN ) 500 MG tablet, Take 500 mg by mouth every 6 (six) hours as needed., Disp: , Rfl:    metoprolol  tartrate (LOPRESSOR ) 50 MG tablet, Take 1 tablet (50 mg total) by mouth once for 1 dose. Take two hours prior to Cardiac CT. (Patient not taking: Reported on 02/11/2024), Disp: 1 tablet, Rfl: 0   mirabegron  ER (MYRBETRIQ ) 25 MG TB24 tablet, Take 1 tablet (25  mg total) by mouth daily. (Patient not taking: Reported on 02/11/2024), Disp: 30 tablet, Rfl: 11   ondansetron  (ZOFRAN ) 4 MG tablet, TAKE ONE TABLET BY MOUTH EVERY 8 HOURS AS NEEDED FOR NAUSEA AND/OR VOMITING, Disp: 30 tablet, Rfl: 4   pantoprazole  (PROTONIX ) 40 MG tablet, Take 1 tablet (40 mg total) by mouth daily., Disp: 90 tablet, Rfl: 1   pantoprazole  (PROTONIX ) 40 MG tablet, Take 1 tablet (40 mg total) by mouth daily., Disp: 90 tablet, Rfl: 1   rOPINIRole (REQUIP) 0.25 MG tablet, Take 0.25 mg by mouth 3 (three) times daily. (Patient not taking: Reported on 02/11/2024), Disp: , Rfl:    SYRINGE-NEEDLE, DISP, 3 ML 25G X 1 3 ML MISC, Use to inject B12., Disp: 6 each, Rfl: 0   triamcinolone  cream (KENALOG ) 0.1 %, Apply 1 Application topically 2 (two) times daily. (Patient not taking: Reported on 02/11/2024), Disp: 30 g, Rfl: 0   TUBERCULIN SYR 1CC/27GX1/2 (B-D TB SYRINGE 1CC/27GX1/2) 27G X 1/2 1 ML MISC, Use to inject methotrexate , Disp: 10 each, Rfl: 1  ZOLMitriptan  (ZOMIG ) 2.5 MG tablet, Take 0.5-1 tablets (1.25-2.5 mg total) by mouth daily as needed for migraines., Disp: 8 tablet, Rfl: 5  Observations/Objective: Patient is well-developed, well-nourished in no acute distress.  Resting comfortably at home.  Head is normocephalic, atraumatic.  No labored breathing. Speech is clear and coherent with logical content.  Patient is alert and oriented at baseline.    Assessment and Plan: 1. Difficult or painful urination (Primary) - cephALEXin  (KEFLEX ) 500 MG capsule; Take 1 capsule (500 mg total) by mouth 2 (two) times daily for 7 days.  Dispense: 14 capsule; Refill: 0    Follow Up Instructions: I discussed the assessment and treatment plan with the patient. The patient was provided an opportunity to ask questions and all were answered. The patient agreed with the plan and demonstrated an understanding of the instructions.  A copy of instructions were sent to the patient via MyChart unless  otherwise noted below.     The patient was advised to call back or seek an in-person evaluation if the symptoms worsen or if the condition fails to improve as anticipated.    [place] Maiorino W Ledford Goodson, NP

## 2024-04-26 NOTE — Patient Instructions (Signed)
 Melanny A Molden, thank you for joining [name], NP for today's virtual visit.  While this provider is not your primary care provider (PCP), if your PCP is located in our provider database this encounter information will be shared with them immediately following your visit.   A [facility] MyChart account gives you access to today's visit and all your visits, tests, and labs performed at [facility]  click here if you don't have a [facility] MyChart account or go to [email or web address]  Consent: (Patient) Alexandra Lewis provided verbal consent for this virtual visit at the beginning of the encounter.  Current Medications:  Current Outpatient Medications:    cephALEXin  (KEFLEX ) 500 MG capsule, Take 1 capsule (500 mg total) by mouth 2 (two) times daily for 7 days., Disp: 14 capsule, Rfl: 0   acetaminophen -codeine  (TYLENOL  #3) 300-30 MG tablet, TAKE 1 TABLET BY MOUTH EVERY 4 (FOUR) HOURS AS NEEDED FOR MODERATE PAIN (PAIN SCORE 4-6)., Disp: 180 tablet, Rfl: 3   adalimumab  (HUMIRA , 2 PEN,) 40 MG/0.4ML pen, Inject 40mg  subcutaneously every 2 weeks as directed., Disp: 2 each, Rfl: 12   butalbital -aspirin -caffeine -codeine  (ASCOMP-CODEINE ) 50-325-40-30 MG capsule, Take 1 capsule by mouth every 8 (eight) hours as needed for pain., Disp: 90 capsule, Rfl: 2   cyanocobalamin  (VITAMIN B12) 1000 MCG/ML injection, Inject 1,000 mcg into the skin every 30 (thirty) days., Disp: , Rfl:    diazepam  (VALIUM ) 2 MG tablet, Take 1 tablet (2 mg total) by mouth every 12 (twelve) hours as needed. for anxiety, Disp: 60 tablet, Rfl: 5   divalproex  (DEPAKOTE ) 125 MG DR tablet, TAKE 1 TABLET BY MOUTH DAILY, Disp: 90 tablet, Rfl: 2   ergocalciferol  (VITAMIN D2) 1.25 MG (50000 UT) capsule, Take 1 capsule (50,000 Units total) by mouth once a week., Disp: 12 capsule, Rfl: 5   Famotidine (PEPCID PO), Take 1 tablet by mouth daily with supper., Disp: , Rfl:    INS SYRINGE/NEEDLE 1CC/28G (B-D  INSULIN  SYRINGE 1CC/28G) 28G X 1/2 1 ML MISC, For use with b12 injections, Disp: 10 each, Rfl: 3   methocarbamol  (ROBAXIN ) 500 MG tablet, Take 500 mg by mouth every 6 (six) hours as needed., Disp: , Rfl:    metoprolol  tartrate (LOPRESSOR ) 50 MG tablet, Take 1 tablet (50 mg total) by mouth once for 1 dose. Take two hours prior to Cardiac CT. (Patient not taking: Reported on 02/11/2024), Disp: 1 tablet, Rfl: 0   mirabegron  ER (MYRBETRIQ ) 25 MG TB24 tablet, Take 1 tablet (25 mg total) by mouth daily. (Patient not taking: Reported on 02/11/2024), Disp: 30 tablet, Rfl: 11   ondansetron  (ZOFRAN ) 4 MG tablet, TAKE ONE TABLET BY MOUTH EVERY 8 HOURS AS NEEDED FOR NAUSEA AND/OR VOMITING, Disp: 30 tablet, Rfl: 4   pantoprazole  (PROTONIX ) 40 MG tablet, Take 1 tablet (40 mg total) by mouth daily., Disp: 90 tablet, Rfl: 1   pantoprazole  (PROTONIX ) 40 MG tablet, Take 1 tablet (40 mg total) by mouth daily., Disp: 90 tablet, Rfl: 1   rOPINIRole (REQUIP) 0.25 MG tablet, Take 0.25 mg by mouth 3 (three) times daily. (Patient not taking: Reported on 02/11/2024), Disp: , Rfl:    SYRINGE-NEEDLE, DISP, 3 ML 25G X 1 3 ML MISC, Use to inject B12., Disp: 6 each, Rfl: 0   triamcinolone  cream (KENALOG ) 0.1 %, Apply 1 Application topically 2 (two) times daily. (Patient not taking: Reported on 02/11/2024), Disp: 30 g, Rfl: 0   TUBERCULIN SYR 1CC/27GX1/2 (B-D TB SYRINGE 1CC/27GX1/2) 27G X 1/2 1 ML  MISC, Use to inject methotrexate , Disp: 10 each, Rfl: 1   ZOLMitriptan  (ZOMIG ) 2.5 MG tablet, Take 0.5-1 tablets (1.25-2.5 mg total) by mouth daily as needed for migraines., Disp: 8 tablet, Rfl: 5   Medications ordered in this encounter:  Meds ordered this encounter  Medications   cephALEXin  (KEFLEX ) 500 MG capsule    Sig: Take 1 capsule (500 mg total) by mouth 2 (two) times daily for 7 days.    Dispense:  14 capsule    Refill:  0    Supervising Provider:   BLAISE ALEENE KIDD [[id number]]     *If you need refills on other medications  prior to your next appointment, please contact your pharmacy*  Follow-Up: Call back or seek an in-person evaluation if the symptoms worsen or if the condition fails to improve as anticipated.  [facility] [phone]  Other Instructions   If you have been instructed to have an in-person evaluation today at a local Urgent Care facility, please use the link below. It will take you to a list of all of our available [facility], including address, phone number and hours of operation. Please do not delay care.  [facility]  If you or a family member do not have a primary care provider, use the link below to schedule a visit and establish care. When you choose a [facility] primary care physician or advanced practice provider, you gain a long-term partner in health. Find a Primary Care Provider  Learn more about 's in-office and virtual care options:  - Get Care Now

## 2024-04-29 ENCOUNTER — Encounter: Payer: Self-pay | Admitting: Internal Medicine

## 2024-04-29 ENCOUNTER — Telehealth: Payer: Self-pay | Admitting: Internal Medicine

## 2024-04-29 NOTE — Telephone Encounter (Signed)
 Spoke to husband.  Patient recently diagnosed with Parkinson's disease.  She is having increasing constipation using a senna plus docusate laxative with some effect but then having to use intermittent MiraLAX.  Most recently she has not defecated in 3 days until she added some MiraLAX in.   I have recommended the following:  1.  MiraLAX purge today  2.  MiraLAX daily  3.  Continue the senna docusate daily but if having too frequent bowel movements use this intermittently  4.  See me in January-needs a late afternoon appointment could have a 350 spot or a nurse held spot  Please find that appointment and call her husband about it  I have sent a MyChart message regarding 1-3

## 2024-04-30 NOTE — Telephone Encounter (Signed)
 I spoke with husband Tanda and booked 06/10/2024 appointment at 3:30pm for his wife.

## 2024-05-12 ENCOUNTER — Other Ambulatory Visit: Payer: Self-pay

## 2024-05-13 ENCOUNTER — Other Ambulatory Visit: Payer: Self-pay

## 2024-05-13 NOTE — Progress Notes (Signed)
 Specialty Pharmacy Refill Coordination Note  Alexandra Lewis is a 68 y.o. female contacted today regarding refills of specialty medication(s) Adalimumab  (Humira  (2 Pen))   Patient requested Delivery   Delivery date: 05/20/24   Verified address: [address] [place] [address]   Medication will be filled on: 05/19/24

## 2024-05-19 ENCOUNTER — Other Ambulatory Visit: Payer: Self-pay

## 2024-05-23 ENCOUNTER — Telehealth: Payer: Self-pay

## 2024-05-23 ENCOUNTER — Other Ambulatory Visit: Payer: Self-pay

## 2024-05-23 NOTE — Telephone Encounter (Signed)
 Pharmacy Patient Advocate Encounter   Received notification from Pt Calls Messages that prior authorization for Humira  CF is required/requested.   Insurance verification completed.   The patient is insured through [facility] ADV[employer]AGE/RX ADVANCE.   Per test claim: PA required; PA submitted to above mentioned insurance via Latent Key/confirmation #/EOC [id number] Status is pending

## 2024-05-23 NOTE — Progress Notes (Signed)
 PA pending

## 2024-05-26 ENCOUNTER — Other Ambulatory Visit: Payer: Self-pay

## 2024-05-26 NOTE — Telephone Encounter (Signed)
 Pharmacy Patient Advocate Encounter  Received notification from [employer] that Prior Authorization for Humira  CF has been APPROVED from 05/23/24 to 11/19/24   PA #/Case ID/Reference #: [id number]

## 2024-06-02 ENCOUNTER — Other Ambulatory Visit: Payer: Self-pay | Admitting: Internal Medicine

## 2024-06-02 ENCOUNTER — Encounter: Payer: Self-pay | Admitting: *Deleted

## 2024-06-09 ENCOUNTER — Other Ambulatory Visit (HOSPITAL_COMMUNITY): Payer: Self-pay

## 2024-06-09 ENCOUNTER — Telehealth: Payer: Self-pay | Admitting: *Deleted

## 2024-06-09 ENCOUNTER — Other Ambulatory Visit: Payer: Self-pay

## 2024-06-09 NOTE — Progress Notes (Signed)
 Specialty Pharmacy Refill Coordination Note  Alexandra Lewis is a 69 y.o. female contacted today regarding refills of specialty medication(s) Adalimumab  (Humira  (2 Pen))  Spoke with Husband  Patient requested Delivery   Delivery date: 06/17/24   Verified address: [address]  [place]   Medication will be filled on: 06/16/24

## 2024-06-09 NOTE — Telephone Encounter (Unsigned)
 Copied from CRM [id number]. Topic: Clinical - Medication Question >> Jun 09, 2024 10:26 AM Delon DASEN wrote: Reason for CRM: Cyndee with [facility] has questions about having 2 different medications with Codeine  in them- [phone]

## 2024-06-10 ENCOUNTER — Ambulatory Visit: Admitting: Internal Medicine

## 2024-06-10 NOTE — Telephone Encounter (Signed)
 Copied from CRM #[id number]. Topic: Clinical - Prescription Issue >> Jun 10, 2024  4:26 PM Alfonso HERO wrote: Reason for CRM: Cyndee the pharmacist from [facility] is calling again in regards to previous message. Can someone please call him back?

## 2024-06-10 NOTE — Telephone Encounter (Signed)
 Pt husband called in to check the status of this due to patient is going to run out of her medication soon and needs to get a refill. Please call to confirm this has been taken care of

## 2024-06-10 NOTE — Telephone Encounter (Signed)
 Called and spoke with Cyndee, [facility] pharmacist. He needed know the reason that pt was on 2 codeine  containing medications.  He reported that he understand that she has been on these medications for a while but they needed that documentation.  After reviewing Dr. Lula last in-person visit 09/05/23, let pharmacist know know reason for medication is arthritic pain and frequent headache.  Attempted to return Tanda, pt spouses call but there was no answer.  Left detailed message for DPR.

## 2024-06-11 ENCOUNTER — Other Ambulatory Visit: Payer: Self-pay

## 2024-06-11 NOTE — Progress Notes (Signed)
 Patient was contacted by the pharmacy regarding their specialty medication(s) Adalimumab  (Humira  (2 Pen)) to reschedule an earlier delivery date, due to impending cold weather conditions. Medication(s) will be filled 06/11/24 for a delivery by 06/12/24

## 2024-07-01 ENCOUNTER — Ambulatory Visit: Admitting: Internal Medicine

## 2024-07-29 ENCOUNTER — Ambulatory Visit: Admitting: Internal Medicine
